# Patient Record
Sex: Male | Born: 1965
Health system: Southern US, Community
[De-identification: ages and names within clinical notes are randomized; demographics above are authoritative.]

## PROBLEM LIST (undated history)

## (undated) DIAGNOSIS — I10 Essential (primary) hypertension: Secondary | ICD-10-CM

## (undated) DIAGNOSIS — N521 Erectile dysfunction due to diseases classified elsewhere: Secondary | ICD-10-CM

## (undated) DIAGNOSIS — E1129 Type 2 diabetes mellitus with other diabetic kidney complication: Secondary | ICD-10-CM

## (undated) DIAGNOSIS — Z87891 Personal history of nicotine dependence: Secondary | ICD-10-CM

## (undated) DIAGNOSIS — M546 Pain in thoracic spine: Secondary | ICD-10-CM

## (undated) DIAGNOSIS — H5010 Unspecified exotropia: Secondary | ICD-10-CM

## (undated) DIAGNOSIS — E1169 Type 2 diabetes mellitus with other specified complication: Secondary | ICD-10-CM

## (undated) DIAGNOSIS — E785 Hyperlipidemia, unspecified: Secondary | ICD-10-CM

## (undated) DIAGNOSIS — Z7982 Long term (current) use of aspirin: Secondary | ICD-10-CM

## (undated) DIAGNOSIS — M1990 Unspecified osteoarthritis, unspecified site: Secondary | ICD-10-CM

## (undated) DIAGNOSIS — E1165 Type 2 diabetes mellitus with hyperglycemia: Principal | ICD-10-CM

## (undated) DIAGNOSIS — N529 Male erectile dysfunction, unspecified: Secondary | ICD-10-CM

## (undated) HISTORY — PX: HIP SURGERY: SHX245

## (undated) HISTORY — DX: Type 2 diabetes mellitus with hyperglycemia: E11.65

## (undated) HISTORY — DX: Pain in thoracic spine: M54.6

## (undated) HISTORY — DX: Unspecified exotropia: H50.10

## (undated) HISTORY — DX: Hyperlipidemia, unspecified: E78.5

## (undated) HISTORY — DX: Essential (primary) hypertension: I10

## (undated) HISTORY — DX: Type 2 diabetes mellitus with other specified complication: E11.69

## (undated) HISTORY — DX: Male erectile dysfunction, unspecified: N52.9

## (undated) HISTORY — PX: SHOULDER SURGERY: SHX246

## (undated) HISTORY — DX: Type 2 diabetes mellitus with other diabetic kidney complication: E11.29

## (undated) HISTORY — DX: Erectile dysfunction due to diseases classified elsewhere: N52.1

---

## 2000-08-20 ENCOUNTER — Ambulatory Visit (HOSPITAL_COMMUNITY): Admission: RE | Admit: 2000-08-20 | Discharge: 2000-08-20 | Payer: Self-pay | Admitting: Orthopedic Surgery

## 2005-11-10 ENCOUNTER — Ambulatory Visit: Payer: Self-pay | Admitting: Gastroenterology

## 2008-05-05 ENCOUNTER — Emergency Department: Payer: Self-pay | Admitting: Emergency Medicine

## 2011-09-06 LAB — HM COLONOSCOPY

## 2012-05-09 DIAGNOSIS — N529 Male erectile dysfunction, unspecified: Secondary | ICD-10-CM | POA: Insufficient documentation

## 2014-02-14 ENCOUNTER — Ambulatory Visit: Payer: Self-pay | Admitting: Family Medicine

## 2014-02-26 LAB — HEPATIC FUNCTION PANEL
ALT: 18 U/L (ref 10–40)
AST: 17 U/L (ref 14–40)
Alkaline Phosphatase: 87 U/L (ref 25–125)
Bilirubin, Total: 0.2 mg/dL

## 2014-02-26 LAB — BASIC METABOLIC PANEL
BUN: 14 mg/dL (ref 4–21)
CREATININE: 1 mg/dL (ref 0.6–1.3)
Glucose: 79 mg/dL
Potassium: 4.2 mmol/L (ref 3.4–5.3)
Sodium: 139 mmol/L (ref 137–147)

## 2014-02-26 LAB — HM DIABETES FOOT EXAM

## 2014-02-26 LAB — LIPID PANEL
Cholesterol: 149 mg/dL (ref 0–200)
HDL: 41 mg/dL (ref 35–70)
LDL Cholesterol: 60 mg/dL
TRIGLYCERIDES: 240 mg/dL — AB (ref 40–160)

## 2014-02-26 LAB — HEMOGLOBIN A1C: HEMOGLOBIN A1C: 10.1 % — AB (ref 4.0–6.0)

## 2014-03-31 ENCOUNTER — Ambulatory Visit: Payer: Self-pay | Admitting: Family Medicine

## 2014-06-11 ENCOUNTER — Other Ambulatory Visit: Payer: Self-pay

## 2014-06-11 MED ORDER — INSULIN GLARGINE 100 UNIT/ML SOLOSTAR PEN: 30.0000 [IU] | PEN_INJECTOR | Freq: Every day | SUBCUTANEOUS | Status: DC

## 2014-06-11 MED ORDER — LISINOPRIL 10 MG PO TABS: 10.0000 mg | ORAL_TABLET | Freq: Every day | ORAL | Status: DC

## 2014-06-12 ENCOUNTER — Encounter: Payer: Self-pay | Admitting: Family Medicine

## 2014-06-12 DIAGNOSIS — N521 Erectile dysfunction due to diseases classified elsewhere: Secondary | ICD-10-CM

## 2014-06-12 DIAGNOSIS — IMO0001 Reserved for inherently not codable concepts without codable children: Secondary | ICD-10-CM | POA: Insufficient documentation

## 2014-06-12 DIAGNOSIS — IMO0002 Reserved for concepts with insufficient information to code with codable children: Secondary | ICD-10-CM

## 2014-06-12 DIAGNOSIS — E1129 Type 2 diabetes mellitus with other diabetic kidney complication: Secondary | ICD-10-CM | POA: Insufficient documentation

## 2014-06-12 DIAGNOSIS — N529 Male erectile dysfunction, unspecified: Secondary | ICD-10-CM | POA: Insufficient documentation

## 2014-06-12 DIAGNOSIS — E1169 Type 2 diabetes mellitus with other specified complication: Secondary | ICD-10-CM | POA: Insufficient documentation

## 2014-06-12 DIAGNOSIS — E785 Hyperlipidemia, unspecified: Secondary | ICD-10-CM | POA: Insufficient documentation

## 2014-06-12 DIAGNOSIS — N5201 Erectile dysfunction due to arterial insufficiency: Secondary | ICD-10-CM

## 2014-06-12 DIAGNOSIS — I129 Hypertensive chronic kidney disease with stage 1 through stage 4 chronic kidney disease, or unspecified chronic kidney disease: Secondary | ICD-10-CM | POA: Insufficient documentation

## 2014-06-12 DIAGNOSIS — H5010 Unspecified exotropia: Secondary | ICD-10-CM | POA: Insufficient documentation

## 2014-06-12 DIAGNOSIS — E1165 Type 2 diabetes mellitus with hyperglycemia: Principal | ICD-10-CM

## 2014-06-12 MED ORDER — ROSUVASTATIN CALCIUM 40 MG PO TABS: 40.0000 mg | ORAL_TABLET | Freq: Every day | ORAL | Status: DC

## 2014-07-31 LAB — HM DIABETES EYE EXAM

## 2014-08-15 ENCOUNTER — Other Ambulatory Visit: Payer: Self-pay | Admitting: Family Medicine

## 2014-08-15 DIAGNOSIS — IMO0002 Reserved for concepts with insufficient information to code with codable children: Secondary | ICD-10-CM

## 2014-08-15 DIAGNOSIS — E1165 Type 2 diabetes mellitus with hyperglycemia: Principal | ICD-10-CM

## 2014-08-15 DIAGNOSIS — E1129 Type 2 diabetes mellitus with other diabetic kidney complication: Secondary | ICD-10-CM

## 2014-08-15 NOTE — Telephone Encounter (Signed)
Routing to provider  

## 2014-08-16 MED ORDER — METFORMIN HCL 1000 MG PO TABS: 1000.0000 mg | ORAL_TABLET | Freq: Two times a day (BID) | ORAL | Status: DC

## 2014-08-16 MED ORDER — GLUCOSE BLOOD VI STRP
1.0000 | ORAL_STRIP | Status: DC | PRN
Start: 2014-08-16 — End: 2016-08-10

## 2014-08-16 NOTE — Telephone Encounter (Signed)
Please let [name] know that I'd like to see patient for an appointment here in the office for:  diabetes Please schedule a visit with me  in the next: 2 weeks Fasting?  yes Thank you, Dr. Sanda Klein ---------------- Please FAX prescriptions to LOCAL pharmacy; I will not do 3 month mail order since he is overdue for visit and labs; need to monitor before we send off 3 month supply as doses may change or meds may be stopped completely and new ones started Dr. Sanda Klein

## 2014-08-16 NOTE — Telephone Encounter (Signed)
I left a vm on pt phone to give [place] a call to schedule a f/u+fasting labs in the next 2 weeks.

## 2014-08-31 DIAGNOSIS — N529 Male erectile dysfunction, unspecified: Secondary | ICD-10-CM | POA: Insufficient documentation

## 2014-08-31 DIAGNOSIS — E1129 Type 2 diabetes mellitus with other diabetic kidney complication: Secondary | ICD-10-CM | POA: Insufficient documentation

## 2014-08-31 DIAGNOSIS — H5010 Unspecified exotropia: Secondary | ICD-10-CM | POA: Insufficient documentation

## 2014-08-31 DIAGNOSIS — I129 Hypertensive chronic kidney disease with stage 1 through stage 4 chronic kidney disease, or unspecified chronic kidney disease: Secondary | ICD-10-CM | POA: Insufficient documentation

## 2014-08-31 DIAGNOSIS — M546 Pain in thoracic spine: Secondary | ICD-10-CM | POA: Insufficient documentation

## 2014-09-05 ENCOUNTER — Encounter: Payer: Self-pay | Admitting: Family Medicine

## 2014-09-05 ENCOUNTER — Ambulatory Visit (INDEPENDENT_AMBULATORY_CARE_PROVIDER_SITE_OTHER): Payer: BLUE CROSS/BLUE SHIELD | Admitting: Family Medicine

## 2014-09-05 VITALS — BP 141/93 | HR 72 | Temp 97.7°F | Ht 66.25 in | Wt 171.0 lb

## 2014-09-05 DIAGNOSIS — E1129 Type 2 diabetes mellitus with other diabetic kidney complication: Secondary | ICD-10-CM

## 2014-09-05 DIAGNOSIS — IMO0002 Reserved for concepts with insufficient information to code with codable children: Secondary | ICD-10-CM

## 2014-09-05 DIAGNOSIS — Z125 Encounter for screening for malignant neoplasm of prostate: Secondary | ICD-10-CM

## 2014-09-05 DIAGNOSIS — N400 Enlarged prostate without lower urinary tract symptoms: Secondary | ICD-10-CM | POA: Insufficient documentation

## 2014-09-05 DIAGNOSIS — E785 Hyperlipidemia, unspecified: Secondary | ICD-10-CM

## 2014-09-05 DIAGNOSIS — Z0000 Encounter for general adult medical examination without abnormal findings: Secondary | ICD-10-CM

## 2014-09-05 DIAGNOSIS — E1165 Type 2 diabetes mellitus with hyperglycemia: Secondary | ICD-10-CM

## 2014-09-05 MED ORDER — ROSUVASTATIN CALCIUM 40 MG PO TABS: 40.0000 mg | ORAL_TABLET | Freq: Every day | ORAL | Status: DC

## 2014-09-05 MED ORDER — METFORMIN HCL 1000 MG PO TABS: 1000.0000 mg | ORAL_TABLET | Freq: Two times a day (BID) | ORAL | Status: DC

## 2014-09-05 MED ORDER — ASPIRIN EC 81 MG PO TBEC: 81.0000 mg | DELAYED_RELEASE_TABLET | Freq: Every day | ORAL | Status: DC

## 2014-09-05 NOTE — Patient Instructions (Addendum)
[facility] staff -- please request colonoscopy report from GI doctor (done in 2013) Start back on coated 81 mg aspirin once a day to help prevent heart attack You do not need another pneumonia vaccine until age 51 Please do get a flu shot every fall, okay to wait until September or October Return in one year for your next physical Return in two weeks for your diabetes and cholesterol; you do not need to come fasting for that since we collected your bloodwork today Please bring your glucose readings with you if you can Your blood pressure is above goal today; try to follow the DASH guidelines and we'll see if it's improved at your appointment in a couple of weeks Try to lose 10 pounds over the next several months, slowly and safely and gradually by reducing portions a little, avoiding sweets and unhealthy snacks, and staying away from empty calories   Health Maintenance A healthy lifestyle and preventative care can promote health and wellness.  Maintain regular health, dental, and eye exams.  Eat a healthy diet. Foods like vegetables, fruits, whole grains, low-fat dairy products, and lean protein foods contain the nutrients you need and are low in calories. Decrease your intake of foods high in solid fats, added sugars, and salt. Get information about a proper diet from your health care provider, if necessary.  Regular physical exercise is one of the most important things you can do for your health. Most adults should get at least 150 minutes of moderate-intensity exercise (any activity that increases your heart rate and causes you to sweat) each week. In addition, most adults need muscle-strengthening exercises on 2 or more days a week.   Maintain a healthy weight. The body mass index (BMI) is a screening tool to identify possible weight problems. It provides an estimate of body fat based on height and weight. Your health care provider can find your BMI and can help you achieve or maintain a  healthy weight. For males 20 years and older:  A BMI below 18.5 is considered underweight.  A BMI of 18.5 to 24.9 is normal.  A BMI of 25 to 29.9 is considered overweight.  A BMI of 30 and above is considered obese.  Maintain normal blood lipids and cholesterol by exercising and minimizing your intake of saturated fat. Eat a balanced diet with plenty of fruits and vegetables. Blood tests for lipids and cholesterol should begin at age 34 and be repeated every 5 years. If your lipid or cholesterol levels are high, you are over age 84, or you are at high risk for heart disease, you may need your cholesterol levels checked more frequently.Ongoing high lipid and cholesterol levels should be treated with medicines if diet and exercise are not working.  If you smoke, find out from your health care provider how to quit. If you do not use tobacco, do not start.  Lung cancer screening is recommended for adults aged 42-80 years who are at high risk for developing lung cancer because of a history of smoking. A yearly low-dose CT scan of the lungs is recommended for people who have at least a 30-pack-year history of smoking and are current smokers or have quit within the past 15 years. A pack year of smoking is smoking an average of 1 pack of cigarettes a day for 1 year (for example, a 30-pack-year history of smoking could mean smoking 1 pack a day for 30 years or 2 packs a day for 15 years). Yearly screening should  continue until the smoker has stopped smoking for at least 15 years. Yearly screening should be stopped for people who develop a health problem that would prevent them from having lung cancer treatment.  If you choose to drink alcohol, do not have more than 2 drinks per day. One drink is considered to be 12 oz (360 mL) of beer, 5 oz (150 mL) of wine, or 1.5 oz (45 mL) of liquor.  Avoid the use of street drugs. Do not share needles with anyone. Ask for help if you need support or instructions about  stopping the use of drugs.  High blood pressure causes heart disease and increases the risk of stroke. Blood pressure should be checked at least every 1-2 years. Ongoing high blood pressure should be treated with medicines if weight loss and exercise are not effective.  If you are 5-53 years old, ask your health care provider if you should take aspirin to prevent heart disease.  Diabetes screening involves taking a blood sample to check your fasting blood sugar level. This should be done once every 3 years after age 54 if you are at a normal weight and without risk factors for diabetes. Testing should be considered at a younger age or be carried out more frequently if you are overweight and have at least 1 risk factor for diabetes.  Colorectal cancer can be detected and often prevented. Most routine colorectal cancer screening begins at the age of 47 and continues through age 43. However, your health care provider may recommend screening at an earlier age if you have risk factors for colon cancer. On a yearly basis, your health care provider may provide home test kits to check for hidden blood in the stool. A small camera at the end of a tube may be used to directly examine the colon (sigmoidoscopy or colonoscopy) to detect the earliest forms of colorectal cancer. Talk to your health care provider about this at age 80 when routine screening begins. A direct exam of the colon should be repeated every 5-10 years through age 44, unless early forms of precancerous polyps or small growths are found.  People who are at an increased risk for hepatitis B should be screened for this virus. You are considered at high risk for hepatitis B if:  You were born in a country where hepatitis B occurs often. Talk with your health care provider about which countries are considered high risk.  Your parents were born in a high-risk country and you have not received a shot to protect against hepatitis B (hepatitis B  vaccine).  You have HIV or AIDS.  You use needles to inject street drugs.  You live with, or have sex with, someone who has hepatitis B.  You are a man who has sex with other men (MSM).  You get hemodialysis treatment.  You take certain medicines for conditions like cancer, organ transplantation, and autoimmune conditions.  Hepatitis C blood testing is recommended for all people born from [birth year] through [birth year] and any individual with known risk factors for hepatitis C.  Healthy men should no longer receive prostate-specific antigen (PSA) blood tests as part of routine cancer screening. Talk to your health care provider about prostate cancer screening.  Testicular cancer screening is not recommended for adolescents or adult males who have no symptoms. Screening includes self-exam, a health care provider exam, and other screening tests. Consult with your health care provider about any symptoms you have or any concerns you have about testicular cancer.  Practice safe sex. Use condoms and avoid high-risk sexual practices to reduce the spread of sexually transmitted infections (STIs).  You should be screened for STIs, including gonorrhea and chlamydia if:  You are sexually active and are younger than 24 years.  You are older than 24 years, and your health care provider tells you that you are at risk for this type of infection.  Your sexual activity has changed since you were last screened, and you are at an increased risk for chlamydia or gonorrhea. Ask your health care provider if you are at risk.  If you are at risk of being infected with HIV, it is recommended that you take a prescription medicine daily to prevent HIV infection. This is called pre-exposure prophylaxis (PrEP). You are considered at risk if:  You are a man who has sex with other men (MSM).  You are a heterosexual man who is sexually active with multiple partners.  You take drugs by injection.  You are sexually active  with a partner who has HIV.  Talk with your health care provider about whether you are at high risk of being infected with HIV. If you choose to begin PrEP, you should first be tested for HIV. You should then be tested every 3 months for as long as you are taking PrEP.  Use sunscreen. Apply sunscreen liberally and repeatedly throughout the day. You should seek shade when your shadow is shorter than you. Protect yourself by wearing long sleeves, pants, a wide-brimmed hat, and sunglasses year round whenever you are outdoors.  Tell your health care provider of new moles or changes in moles, especially if there is a change in shape or color. Also, tell your health care provider if a mole is larger than the size of a pencil eraser.  A one-time screening for abdominal aortic aneurysm (AAA) and surgical repair of large AAAs by ultrasound is recommended for men aged 51-75 years who are current or former smokers.  Stay current with your vaccines (immunizations). Document Released: 06/20/2007 Document Revised: 12/27/2012 Document Reviewed: 05/19/2010 [facility] Patient Information 2015 [place], [place]. This information is not intended to replace advice given to you by your health care provider. Make sure you discuss any questions you have with your health care provider. DASH Eating Plan DASH stands for "Dietary Approaches to Stop Hypertension." The DASH eating plan is a healthy eating plan that has been shown to reduce high blood pressure (hypertension). Additional health benefits may include reducing the risk of type 2 diabetes mellitus, heart disease, and stroke. The DASH eating plan may also help with weight loss. WHAT DO I NEED TO KNOW ABOUT THE DASH EATING PLAN? For the DASH eating plan, you will follow these general guidelines:  Choose foods with a percent daily value for sodium of less than 5% (as listed on the food label).  Use salt-free seasonings or herbs instead of table salt or sea salt.  Check  with your health care provider or pharmacist before using salt substitutes.  Eat lower-sodium products, often labeled as "lower sodium" or "no salt added."  Eat fresh foods.  Eat more vegetables, fruits, and low-fat dairy products.  Choose whole grains. Look for the word "whole" as the first word in the ingredient list.  Choose fish and skinless chicken or [place] more often than red meat. Limit fish, poultry, and meat to 6 oz (170 g) each day.  Limit sweets, desserts, sugars, and sugary drinks.  Choose heart-healthy fats.  Limit cheese to 1 oz (  28 g) per day.  Eat more home-cooked food and less restaurant, buffet, and fast food.  Limit fried foods.  Cook foods using methods other than frying.  Limit canned vegetables. If you do use them, rinse them well to decrease the sodium.  When eating at a restaurant, ask that your food be prepared with less salt, or no salt if possible. WHAT FOODS CAN I EAT? Seek help from a dietitian for individual calorie needs. Grains Whole grain or whole wheat bread. Brown rice. Whole grain or whole wheat pasta. Quinoa, bulgur, and whole grain cereals. Low-sodium cereals. Corn or whole wheat flour tortillas. Whole grain cornbread. Whole grain crackers. Low-sodium crackers. Vegetables Fresh or frozen vegetables (raw, steamed, roasted, or grilled). Low-sodium or reduced-sodium tomato and vegetable juices. Low-sodium or reduced-sodium tomato sauce and paste. Low-sodium or reduced-sodium canned vegetables.  Fruits All fresh, canned (in natural juice), or frozen fruits. Meat and Other Protein Products Ground beef (85% or leaner), grass-fed beef, or beef trimmed of fat. Skinless chicken or [place]. Ground chicken or [place]. Pork trimmed of fat. All fish and seafood. Eggs. Dried beans, peas, or lentils. Unsalted nuts and seeds. Unsalted canned beans. Dairy Low-fat dairy products, such as skim or 1% milk, 2% or reduced-fat cheeses, low-fat ricotta or cottage  cheese, or plain low-fat yogurt. Low-sodium or reduced-sodium cheeses. Fats and Oils Tub margarines without trans fats. Light or reduced-fat mayonnaise and salad dressings (reduced sodium). Avocado. Safflower, olive, or canola oils. Natural peanut or almond butter. Other Unsalted popcorn and pretzels. The items listed above may not be a complete list of recommended foods or beverages. Contact your dietitian for more options. WHAT FOODS ARE NOT RECOMMENDED? Grains White bread. White pasta. White rice. Refined cornbread. Bagels and croissants. Crackers that contain trans fat. Vegetables Creamed or fried vegetables. Vegetables in a cheese sauce. Regular canned vegetables. Regular canned tomato sauce and paste. Regular tomato and vegetable juices. Fruits Dried fruits. Canned fruit in light or heavy syrup. Fruit juice. Meat and Other Protein Products Fatty cuts of meat. Ribs, chicken wings, bacon, sausage, bologna, salami, chitterlings, fatback, hot dogs, bratwurst, and packaged luncheon meats. Salted nuts and seeds. Canned beans with salt. Dairy Whole or 2% milk, cream, half-and-half, and cream cheese. Whole-fat or sweetened yogurt. Full-fat cheeses or blue cheese. Nondairy creamers and whipped toppings. Processed cheese, cheese spreads, or cheese curds. Condiments Onion and garlic salt, seasoned salt, table salt, and sea salt. Canned and packaged gravies. Worcestershire sauce. Tartar sauce. Barbecue sauce. Teriyaki sauce. Soy sauce, including reduced sodium. Steak sauce. Fish sauce. Oyster sauce. Cocktail sauce. Horseradish. Ketchup and mustard. Meat flavorings and tenderizers. Bouillon cubes. Hot sauce. Tabasco sauce. Marinades. Taco seasonings. Relishes. Fats and Oils Butter, stick margarine, lard, shortening, ghee, and bacon fat. Coconut, palm kernel, or palm oils. Regular salad dressings. Other Pickles and olives. Salted popcorn and pretzels. The items listed above may not be a complete list  of foods and beverages to avoid. Contact your dietitian for more information. WHERE CAN I FIND MORE INFORMATION? National Heart, Lung, and Blood Institute: travelstabloid.com Document Released: 12/11/2010 Document Revised: 05/08/2013 Document Reviewed: 10/26/2012 [facility] Patient Information 2015 [place], [place]. This information is not intended to replace advice given to you by your health care provider. Make sure you discuss any questions you have with your health care provider.

## 2014-09-05 NOTE — Assessment & Plan Note (Signed)
Check lipids will discuss later

## 2014-09-05 NOTE — Addendum Note (Signed)
Addended by: Hanny Elsberry, Satira Anis on: 09/05/2014 03:07 PM   Modules accepted: Orders

## 2014-09-05 NOTE — Progress Notes (Addendum)
Patient ID: Arthur [name], male   DOB: Mar 06, 1965, 49 y.o.   MRN: [id number]   Subjective:   Arthur [name] is a 49 y.o. male here for a complete physical exam  Interim issues since last visit: he had back problems; had a massage and the therapist said that they said there was tenseness and then asked about him changing shoes, leaning to one side all the time; the heels on the right side were all worn out; he went a few times, therapist removed the stress and all better  Obesity: not a problem; overweight, goal 160 pounds Alcohol: not a problem Tobacco: quit Depression screen [facility] 2/9 09/05/2014  Decreased Interest 0  Down, Depressed, Hopeless 0  PHQ - 2 Score 0  HIV, Hep C, Hep B testing: low risk, politely declined Cholesterol: today Glucose and A1C: today Colorectal cancer: UTD, has had two colonoscopies, one at age 6 when he had some bleeding; then every five years at that point; then had one at age 82; had it done in 2013 Breast cancer: low risk Lung cancer: quit, not old enough Osteoporosis: low risk AAA: not old enough Aspirin: he fits criteria, no longer taking the aspirin any more; he just stopped Exercise: walks a lot on his job; no formal exercise; 15k steps a day, wears a podometer for a study with work Diet: tries to watch a good diet, there is room for improvement  He did have some flow issues a while back and saw urologist; no issues now; he had an enlarged prostate; has had that done several times;   Past Medical History  Diagnosis Date  . Type II diabetes mellitus with renal manifestations, uncontrolled 06/12/2014  . Erectile dysfunction associated with type 2 diabetes mellitus   . Diabetes mellitus with renal complications   . Hypertension   . Hyperlipidemia   . ED (erectile dysfunction)   . Exotropia   . Thoracic back pain    Past Surgical History  Procedure Laterality Date  . Shoulder surgery Right     bone spurs removed   Family History  Problem  Relation Age of Onset  . Diabetes Father   . Diabetes Sister   . Lung disease Mother   . Diabetes Maternal Aunt    Social History  Substance Use Topics  . Smoking status: Former Smoker -- 1.00 packs/day for 20 years    Types: Cigarettes    Quit date: 01/05/1986  . Smokeless tobacco: Never Used  . Alcohol Use: No   Review of Systems  Objective:   Filed Vitals:   09/05/14 1409  BP: 141/93  Pulse: 72  Temp: 97.7 F (36.5 C)  Height: 5' 6.25" (1.683 m)  Weight: 171 lb (77.565 kg)  SpO2: 100%   Body mass index is 27.38 kg/(m^2). Wt Readings from Last 3 Encounters:  09/05/14 171 lb (77.565 kg)  03/26/14 173 lb (78.472 kg)   Physical Exam  Constitutional: He appears well-developed and well-nourished. No distress.  HENT:  Head: Normocephalic and atraumatic.  Nose: Nose normal.  Mouth/Throat: Oropharynx is clear and moist.  Eyes: EOM are normal. No scleral icterus.  Neck: No JVD present. No thyromegaly present.  Cardiovascular: Normal rate, regular rhythm and normal heart sounds.   Pulmonary/Chest: Effort normal and breath sounds normal. No respiratory distress. He has no wheezes. He has no rales.  Abdominal: Soft. Bowel sounds are normal. He exhibits no distension. There is no tenderness. There is no guarding.  Genitourinary: Prostate normal. Rectal exam  shows no external hemorrhoid, no fissure, no mass, no tenderness and anal tone normal. Prostate is not enlarged (1+, symmetric) and not tender.  Musculoskeletal: Normal range of motion. He exhibits no edema.  Lymphadenopathy:    He has no cervical adenopathy.  Neurological: He is alert. He displays normal reflexes. He exhibits normal muscle tone. Coordination normal.  Skin: Skin is warm and dry. No rash noted. He is not diaphoretic. No erythema. No pallor.  Psychiatric: He has a normal mood and affect. His behavior is normal. Judgment and thought content normal.    Assessment/Plan:   Problem List Items Addressed This  Visit      Endocrine   Type II diabetes mellitus with renal manifestations, uncontrolled    Check A1C and urine microalbumin today; will have him return for discussion      Relevant Medications   aspirin EC 81 MG tablet   Other Relevant Orders   Hgb A1c w/o eAG   Microalbumin / creatinine urine ratio     Genitourinary   Benign prostatic hypertrophy   Relevant Orders   PSA     Other   Hyperlipidemia    Check lipids will discuss later      Relevant Medications   aspirin EC 81 MG tablet   Other Relevant Orders   Lipid Panel w/o Chol/HDL Ratio   Preventative health care - Primary   Relevant Orders   Comprehensive metabolic panel   Prostate cancer screening   Relevant Orders   PSA      Meds ordered this encounter  Medications  . aspirin EC 81 MG tablet    Sig: Take 1 tablet (81 mg total) by mouth daily.   Orders Placed This Encounter  Procedures  . Hgb A1c w/o eAG  . Lipid Panel w/o Chol/HDL Ratio    Order Specific Question:  Has the patient fasted?    Answer:  Yes  . Comprehensive metabolic panel    Order Specific Question:  Has the patient fasted?    Answer:  Yes  . Microalbumin / creatinine urine ratio  . PSA    Follow up plan: Return in about 1 year (around 09/05/2015) for for next physical; 2 weeks for diabetes and cholesterol.  An After Visit Summary was printed and given to the patient.

## 2014-09-05 NOTE — Assessment & Plan Note (Signed)
Check A1C and urine microalbumin today; will have him return for discussion

## 2014-09-06 ENCOUNTER — Other Ambulatory Visit: Payer: Self-pay | Admitting: Family Medicine

## 2014-09-06 ENCOUNTER — Encounter: Payer: Self-pay | Admitting: Family Medicine

## 2014-09-06 DIAGNOSIS — E785 Hyperlipidemia, unspecified: Secondary | ICD-10-CM

## 2014-09-06 DIAGNOSIS — IMO0001 Reserved for inherently not codable concepts without codable children: Secondary | ICD-10-CM

## 2014-09-06 LAB — HGB A1C W/O EAG: Hgb A1c MFr Bld: 11.1 % — ABNORMAL HIGH (ref 4.8–5.6)

## 2014-09-06 LAB — COMPREHENSIVE METABOLIC PANEL
ALT: 23 IU/L (ref 0–44)
AST: 20 IU/L (ref 0–40)
Albumin/Globulin Ratio: 1.8 (ref 1.1–2.5)
Albumin: 4.5 g/dL (ref 3.5–5.5)
Alkaline Phosphatase: 75 IU/L (ref 39–117)
BUN/Creatinine Ratio: 8 — ABNORMAL LOW (ref 9–20)
BUN: 9 mg/dL (ref 6–24)
Bilirubin Total: 0.3 mg/dL (ref 0.0–1.2)
CALCIUM: 10 mg/dL (ref 8.7–10.2)
CO2: 24 mmol/L (ref 18–29)
CREATININE: 1.12 mg/dL (ref 0.76–1.27)
Chloride: 103 mmol/L (ref 97–108)
GFR, EST AFRICAN AMERICAN: 87 mL/min/{1.73_m2} (ref >59)
GFR, EST NON AFRICAN AMERICAN: 76 mL/min/{1.73_m2} (ref >59)
GLOBULIN, TOTAL: 2.5 g/dL (ref 1.5–4.5)
Glucose: 74 mg/dL (ref 65–99)
Potassium: 3.9 mmol/L (ref 3.5–5.2)
SODIUM: 143 mmol/L (ref 134–144)
TOTAL PROTEIN: 7 g/dL (ref 6.0–8.5)

## 2014-09-06 LAB — LIPID PANEL W/O CHOL/HDL RATIO
Cholesterol, Total: 337 mg/dL — ABNORMAL HIGH (ref 100–199)
HDL: 47 mg/dL (ref >39)
LDL Calculated: 260 mg/dL — ABNORMAL HIGH (ref 0–99)
Triglycerides: 151 mg/dL — ABNORMAL HIGH (ref 0–149)
VLDL Cholesterol Cal: 30 mg/dL (ref 5–40)

## 2014-09-06 LAB — MICROALBUMIN / CREATININE URINE RATIO
CREATININE, UR: 105.5 mg/dL
Creatinine, Urine: 120.8 mg/dL
MICROALB/CREAT RATIO: 52.9 mg/g creat — ABNORMAL HIGH (ref 0.0–30.0)
MICROALB/CREAT RATIO: 48.5 mg/g{creat} — AB (ref 0.0–30.0)
MICROALBUM., U, RANDOM: 55.8 ug/mL
MICROALBUM., U, RANDOM: 58.6 ug/mL

## 2014-09-06 LAB — PSA: Prostate Specific Ag, Serum: 0.8 ng/mL (ref 0.0–4.0)

## 2014-09-06 NOTE — Assessment & Plan Note (Signed)
LDL 260; refer to endo

## 2014-09-06 NOTE — Assessment & Plan Note (Signed)
Refer to endo 

## 2014-09-19 ENCOUNTER — Ambulatory Visit: Payer: BLUE CROSS/BLUE SHIELD | Admitting: Family Medicine

## 2014-09-25 ENCOUNTER — Telehealth: Payer: Self-pay

## 2014-09-25 DIAGNOSIS — E1129 Type 2 diabetes mellitus with other diabetic kidney complication: Secondary | ICD-10-CM

## 2014-09-25 DIAGNOSIS — IMO0002 Reserved for concepts with insufficient information to code with codable children: Secondary | ICD-10-CM

## 2014-09-25 DIAGNOSIS — E1165 Type 2 diabetes mellitus with hyperglycemia: Principal | ICD-10-CM

## 2014-09-25 MED ORDER — INSULIN GLARGINE 100 UNIT/ML SOLOSTAR PEN: 35.0000 [IU] | PEN_INJECTOR | Freq: Every day | SUBCUTANEOUS | Status: DC

## 2014-09-25 NOTE — Telephone Encounter (Signed)
Left message to call.

## 2014-09-25 NOTE — Assessment & Plan Note (Signed)
Uncontrolled type 2 diabetes mellitus; may be LADA or type 1.5;

## 2014-09-25 NOTE — Telephone Encounter (Signed)
Yes, new referral entered In the meantime, have patient increase his once a day insulin from thirty units a day to thirty-five units a day; if still high in one week (over 200 with no lows), then increase to thirty-eight units once a day

## 2014-09-25 NOTE — Telephone Encounter (Signed)
Patient also called in regards to him missing his appointment, he would like to be rescheduled with someone else since they can no longer see him. Can you put in a new referral? Thanks!

## 2014-09-25 NOTE — Telephone Encounter (Signed)
Barbara with [facility] called and stated that patient called this morning and cancelled his appt. With them for today. He said he had a meeting. They informed him that their policy is if they do not cancel with a 24 hour notice for new patients, they will NOT be rescheduled.

## 2014-09-26 NOTE — Telephone Encounter (Signed)
Patient notified

## 2014-12-04 ENCOUNTER — Telehealth: Payer: Self-pay | Admitting: Family Medicine

## 2014-12-04 NOTE — Telephone Encounter (Signed)
Patient called wanting a sample or a Rx of his Insulin Glargine (LANTUS) 100 UNIT/ML Solostar Pen because he is out and won't get his mail order until Friday. Please call patient if he can come in and get a sample to last him until then.

## 2014-12-05 NOTE — Telephone Encounter (Signed)
That's fine to give him one sample Lantus if we have it, OR you can call in one refill to his local pharmacy, same sig If not, he can check with his endocrinologist (I referred him to Dr. Gabriel Carina more than two months ago)

## 2014-12-05 NOTE — Telephone Encounter (Signed)
Left message to call.

## 2014-12-05 NOTE — Telephone Encounter (Signed)
Routing to provider  

## 2014-12-06 NOTE — Telephone Encounter (Signed)
I spoke with patient, he states he got it and does not need a sample or rx called in.

## 2014-12-07 ENCOUNTER — Ambulatory Visit (INDEPENDENT_AMBULATORY_CARE_PROVIDER_SITE_OTHER): Payer: BLUE CROSS/BLUE SHIELD | Admitting: Family Medicine

## 2014-12-07 ENCOUNTER — Encounter: Payer: Self-pay | Admitting: Family Medicine

## 2014-12-07 VITALS — BP 126/86 | HR 72 | Temp 97.6°F | Wt 169.0 lb

## 2014-12-07 DIAGNOSIS — E1121 Type 2 diabetes mellitus with diabetic nephropathy: Secondary | ICD-10-CM

## 2014-12-07 DIAGNOSIS — G8929 Other chronic pain: Secondary | ICD-10-CM | POA: Diagnosis not present

## 2014-12-07 DIAGNOSIS — I10 Essential (primary) hypertension: Secondary | ICD-10-CM | POA: Diagnosis not present

## 2014-12-07 DIAGNOSIS — IMO0002 Reserved for concepts with insufficient information to code with codable children: Secondary | ICD-10-CM

## 2014-12-07 DIAGNOSIS — Z5181 Encounter for therapeutic drug level monitoring: Secondary | ICD-10-CM

## 2014-12-07 DIAGNOSIS — Z794 Long term (current) use of insulin: Secondary | ICD-10-CM

## 2014-12-07 DIAGNOSIS — M25562 Pain in left knee: Secondary | ICD-10-CM | POA: Diagnosis not present

## 2014-12-07 DIAGNOSIS — E1165 Type 2 diabetes mellitus with hyperglycemia: Secondary | ICD-10-CM | POA: Diagnosis not present

## 2014-12-07 DIAGNOSIS — E785 Hyperlipidemia, unspecified: Secondary | ICD-10-CM | POA: Diagnosis not present

## 2014-12-07 MED ORDER — FLUTICASONE PROPIONATE 50 MCG/ACT NA SUSP: 2.0000 | Freq: Every day | NASAL | Status: DC

## 2014-12-07 NOTE — Patient Instructions (Addendum)
Try to use PLAIN allergy medicine without the decongestant Avoid: phenylephrine, phenylpropanolamine, and pseudoephredine Use the new nasal spray and hopefully that will keep you from having to use any decongestants We'll let you know what the labs show We'll refer you to an orthopaedist If you have not heard anything from my staff in a week about any orders/referrals/studies from today, please contact us here to follow-up [phone] Keep up the good work

## 2014-12-07 NOTE — Assessment & Plan Note (Addendum)
Check A1c; fasting glucose checked today; eye exam UTD; foot exam today by MD; we are both hopeful that his A1c will have dropped over the last 3 months, encouragement given

## 2014-12-07 NOTE — Assessment & Plan Note (Signed)
Fasting lipids today; limit saturated fats

## 2014-12-07 NOTE — Progress Notes (Signed)
BP 126/86 mmHg  Pulse 72  Temp(Src) 97.6 F (36.4 C)  Wt 169 lb (76.658 kg)  SpO2 100%  Subjective:    Patient ID: Arthur [name], male    DOB: Jul 17, 1965, 49 y.o.   MRN: [id number]  HPI: Arthur [name] is a 49 y.o. male  Chief Complaint  Patient presents with  . Diabetes  . Hypertension   Patient has type 2 diabetes, treated with insulin He forgot to bring his meter in, but sugars have been really good; 3 months since last labs and he is fasting and really hoping for better numbers Sugars have been 100-150 range He is eating better and taking his medicine all the time He is expecting his A1c to be better Reviewed last A1c from September 05, 2014 A1c 11.1  High blood pressure noted in the chart (above under chief complaint), but patient says he doesn't really have high blood pressure; we discussed; he is really on ACE-I to protect his kidneys; he tries to avoid salt; does use decongestants every once in a while (I advised him not to do this and explained why)  He had really elevated cholesterol at last check and is now taking a statin; no problems on that Reviewed labs from September 05, 2014: Total cholesterol 337 TG 151 HDL 47 LDL 260  Knee pain (left); saw orthopaedist; no tylenol, no ice; has brace at home  Relevant past medical, surgical, family and social history reviewed and updated as indicated. Interim medical history since our last visit reviewed. Allergies and medications reviewed and updated.  Review of Systems  Respiratory: Negative for shortness of breath.   Cardiovascular: Negative for chest pain.  Musculoskeletal: Positive for arthralgias (knee pain; saw orthopaedist).   Per HPI unless specifically indicated above     Objective:    BP 126/86 mmHg  Pulse 72  Temp(Src) 97.6 F (36.4 C)  Wt 169 lb (76.658 kg)  SpO2 100%  Wt Readings from Last 3 Encounters:  12/07/14 169 lb (76.658 kg)  09/05/14 171 lb (77.565 kg)  03/26/14 173 lb (78.472 kg)     Physical Exam  Constitutional: He appears well-developed and well-nourished. No distress.  HENT:  Head: Normocephalic and atraumatic.  Eyes: EOM are normal. No scleral icterus.  Neck: No thyromegaly present.  Cardiovascular: Normal rate and regular rhythm.   No murmur heard. Pulmonary/Chest: Effort normal and breath sounds normal.  Abdominal: Soft. Bowel sounds are normal. He exhibits no distension.  Musculoskeletal: He exhibits no edema.  Neurological: He is alert. He displays no tremor. No sensory deficit (of the feet). Coordination normal.  Skin: Skin is warm and dry. No pallor.  Psychiatric: He has a normal mood and affect. His behavior is normal. Judgment and thought content normal.   Diabetic Foot Form - Detailed   Diabetic Foot Exam - detailed  Diabetic Foot exam was performed with the following findings:  Yes 12/07/2014  4:36 PM  Visual Foot Exam completed.:  Yes  Are the toenails long?:  No  Are the toenails thick?:  No  Are the toenails ingrown?:  No    Pulse Foot Exam completed.:  Yes  Right Dorsalis Pedis:  Present Left Dorsalis Pedis:  Present  Sensory Foot Exam Completed.:  Yes  Swelling:  No  Semmes-Weinstein Monofilament Test  R Site 1-Great Toe:  Pos L Site 1-Great Toe:  Pos  R Site 4:  Pos L Site 4:  Pos  R Site 5:  Pos L Site 5:  Pos           Assessment & Plan:   Problem List Items Addressed This Visit      Cardiovascular and Mediastinum   Hypertension    BP controlled today on the ACE-I, which is used for renal protection        Endocrine   Type II diabetes mellitus with renal manifestations, uncontrolled (HCC) - Primary    Check A1c; fasting glucose checked today; eye exam UTD; foot exam today by MD; we are both hopeful that his A1c will have dropped over the last 3 months, encouragement given      Relevant Orders   Basic metabolic panel (Completed)   Hgb A1c w/o eAG     Other   Hyperlipidemia    Fasting lipids today; limit saturated  fats      Relevant Orders   Lipid Panel w/o Chol/HDL Ratio (Completed)   Chronic pain of left knee    Will refer to orthopaedist for evaluation and treatment; would like to AVOID NSAIDs if at all possible for this patient      Relevant Orders   Ambulatory referral to Orthopedic Surgery   Medication monitoring encounter   Relevant Orders   ALT (Completed)      Follow up plan: Return in about 3 months (around 03/07/2015) for diabetes.  An after-visit summary was printed and given to the patient at [facility].  Please see the patient instructions which may contain other information and recommendations beyond what is mentioned above in the assessment and plan.  Orders Placed This Encounter  Procedures  . Lipid Panel w/o Chol/HDL Ratio  . Basic metabolic panel  . ALT  . Hgb A1c w/o eAG  . Specimen status report  . Ambulatory referral to Orthopedic Surgery  . [facility] DIABETES EYE EXAM

## 2014-12-11 NOTE — Assessment & Plan Note (Signed)
BP controlled today on the ACE-I, which is used for renal protection

## 2014-12-11 NOTE — Assessment & Plan Note (Addendum)
Will refer to orthopaedist for evaluation and treatment; would like to AVOID NSAIDs if at all possible for this patient

## 2014-12-12 LAB — SPECIMEN STATUS REPORT

## 2014-12-12 LAB — HGB A1C W/O EAG: Hgb A1c MFr Bld: 8.9 % — ABNORMAL HIGH (ref 4.8–5.6)

## 2014-12-17 ENCOUNTER — Encounter (HOSPITAL_COMMUNITY): Payer: Self-pay | Admitting: Emergency Medicine

## 2014-12-17 ENCOUNTER — Emergency Department (HOSPITAL_COMMUNITY): Payer: Worker's Compensation

## 2014-12-17 ENCOUNTER — Emergency Department (HOSPITAL_COMMUNITY)
Admission: EM | Admit: 2014-12-17 | Discharge: 2014-12-17 | Disposition: A | Discharge status: Home / Self Care | Payer: Worker's Compensation | Attending: Emergency Medicine | Admitting: Emergency Medicine

## 2014-12-17 DIAGNOSIS — Z7984 Long term (current) use of oral hypoglycemic drugs: Secondary | ICD-10-CM | POA: Diagnosis not present

## 2014-12-17 DIAGNOSIS — E1129 Type 2 diabetes mellitus with other diabetic kidney complication: Secondary | ICD-10-CM | POA: Diagnosis not present

## 2014-12-17 DIAGNOSIS — N521 Erectile dysfunction due to diseases classified elsewhere: Secondary | ICD-10-CM | POA: Insufficient documentation

## 2014-12-17 DIAGNOSIS — Z8739 Personal history of other diseases of the musculoskeletal system and connective tissue: Secondary | ICD-10-CM | POA: Insufficient documentation

## 2014-12-17 DIAGNOSIS — Y9289 Other specified places as the place of occurrence of the external cause: Secondary | ICD-10-CM | POA: Insufficient documentation

## 2014-12-17 DIAGNOSIS — Z794 Long term (current) use of insulin: Secondary | ICD-10-CM | POA: Diagnosis not present

## 2014-12-17 DIAGNOSIS — E1169 Type 2 diabetes mellitus with other specified complication: Secondary | ICD-10-CM | POA: Insufficient documentation

## 2014-12-17 DIAGNOSIS — W108XXA Fall (on) (from) other stairs and steps, initial encounter: Secondary | ICD-10-CM | POA: Diagnosis not present

## 2014-12-17 DIAGNOSIS — M79604 Pain in right leg: Secondary | ICD-10-CM

## 2014-12-17 DIAGNOSIS — Z8669 Personal history of other diseases of the nervous system and sense organs: Secondary | ICD-10-CM | POA: Diagnosis not present

## 2014-12-17 DIAGNOSIS — Z79899 Other long term (current) drug therapy: Secondary | ICD-10-CM | POA: Diagnosis not present

## 2014-12-17 DIAGNOSIS — Z7951 Long term (current) use of inhaled steroids: Secondary | ICD-10-CM | POA: Diagnosis not present

## 2014-12-17 DIAGNOSIS — S7011XA Contusion of right thigh, initial encounter: Secondary | ICD-10-CM

## 2014-12-17 DIAGNOSIS — I10 Essential (primary) hypertension: Secondary | ICD-10-CM | POA: Diagnosis not present

## 2014-12-17 DIAGNOSIS — Z87891 Personal history of nicotine dependence: Secondary | ICD-10-CM | POA: Diagnosis not present

## 2014-12-17 DIAGNOSIS — S70311A Abrasion, right thigh, initial encounter: Secondary | ICD-10-CM | POA: Insufficient documentation

## 2014-12-17 DIAGNOSIS — Y990 Civilian activity done for income or pay: Secondary | ICD-10-CM | POA: Insufficient documentation

## 2014-12-17 DIAGNOSIS — E785 Hyperlipidemia, unspecified: Secondary | ICD-10-CM | POA: Insufficient documentation

## 2014-12-17 DIAGNOSIS — Y9389 Activity, other specified: Secondary | ICD-10-CM | POA: Diagnosis not present

## 2014-12-17 DIAGNOSIS — S8991XA Unspecified injury of right lower leg, initial encounter: Secondary | ICD-10-CM | POA: Diagnosis present

## 2014-12-17 LAB — CBC WITH DIFFERENTIAL/PLATELET
BASOS PCT: 0 %
Basophils Absolute: 0 10*3/uL (ref 0.0–0.1)
EOS ABS: 0.1 10*3/uL (ref 0.0–0.7)
EOS PCT: 2 %
HCT: 38.8 % — ABNORMAL LOW (ref 39.0–52.0)
HEMOGLOBIN: 13 g/dL (ref 13.0–17.0)
LYMPHS ABS: 2.3 10*3/uL (ref 0.7–4.0)
Lymphocytes Relative: 39 %
MCH: 27.2 pg (ref 26.0–34.0)
MCHC: 33.5 g/dL (ref 30.0–36.0)
MCV: 81.2 fL (ref 78.0–100.0)
Monocytes Absolute: 0.5 10*3/uL (ref 0.1–1.0)
Monocytes Relative: 8 %
NEUTROS PCT: 51 %
Neutro Abs: 3 10*3/uL (ref 1.7–7.7)
PLATELETS: 225 10*3/uL (ref 150–400)
RBC: 4.78 MIL/uL (ref 4.22–5.81)
RDW: 12.7 % (ref 11.5–15.5)
WBC: 5.9 10*3/uL (ref 4.0–10.5)

## 2014-12-17 LAB — I-STAT CHEM 8, ED
BUN: 11 mg/dL (ref 6–20)
CALCIUM ION: 1.16 mmol/L (ref 1.12–1.23)
CHLORIDE: 103 mmol/L (ref 101–111)
Creatinine, Ser: 1 mg/dL (ref 0.61–1.24)
Glucose, Bld: 240 mg/dL — ABNORMAL HIGH (ref 65–99)
HEMATOCRIT: 42 % (ref 39.0–52.0)
Hemoglobin: 14.3 g/dL (ref 13.0–17.0)
Potassium: 3.5 mmol/L (ref 3.5–5.1)
SODIUM: 141 mmol/L (ref 135–145)
TCO2: 23 mmol/L (ref 0–100)

## 2014-12-17 MED ORDER — OXYCODONE-ACETAMINOPHEN 5-325 MG PO TABS: 1.0000 | ORAL_TABLET | ORAL | Status: DC | PRN

## 2014-12-17 MED ORDER — ONDANSETRON HCL 4 MG/2ML IJ SOLN
4.0000 mg | Freq: Once | INTRAMUSCULAR | Status: AC
  Administered 2014-12-17: 4 mg via INTRAVENOUS
  Filled 2014-12-17: qty 2

## 2014-12-17 MED ORDER — FENTANYL CITRATE (PF) 100 MCG/2ML IJ SOLN
50.0000 ug | Freq: Once | INTRAMUSCULAR | Status: AC
  Administered 2014-12-17: 50 ug via INTRAVENOUS
  Filled 2014-12-17: qty 2

## 2014-12-17 MED ORDER — OXYCODONE-ACETAMINOPHEN 5-325 MG PO TABS
1.0000 | ORAL_TABLET | Freq: Once | ORAL | Status: AC
  Administered 2014-12-17: 1 via ORAL
  Filled 2014-12-17: qty 1

## 2014-12-17 MED ORDER — NAPROXEN 500 MG PO TABS: 500.0000 mg | ORAL_TABLET | Freq: Two times a day (BID) | ORAL | Status: DC

## 2014-12-17 MED ORDER — PROMETHAZINE HCL 25 MG PO TABS: 25.0000 mg | ORAL_TABLET | Freq: Four times a day (QID) | ORAL | Status: DC | PRN

## 2014-12-17 MED ORDER — HYDROMORPHONE HCL 1 MG/ML IJ SOLN
1.0000 mg | Freq: Once | INTRAMUSCULAR | Status: AC
  Administered 2014-12-17: 1 mg via INTRAVENOUS
  Filled 2014-12-17: qty 1

## 2014-12-17 NOTE — ED Provider Notes (Signed)
CSN: [id number]     Arrival date & time 12/17/14  1133 History   First MD Initiated Contact with Patient 12/17/14 1136     Chief Complaint  Patient presents with  . Leg Injury     (Consider location/radiation/quality/duration/timing/severity/associated sxs/prior Treatment) HPI Arthur [name] is a 49 y.o. male history of type 2 diabetes, hypertension, hyperlipidemia, presents to emergency department complaining of a fall. Patient states that he missed a step and fell in between a truck and loading deck. He states his body went forward but his right leg went through the hole and did not move. Patient's complaining of pain to the right thigh. He reports some numbness sensation to the right hip. He denies any numbness or weakness distal to the injury. He denies hitting his head. No loss of consciousness. No other injuries.  Past Medical History  Diagnosis Date  . Type II diabetes mellitus with renal manifestations, uncontrolled ([place]) 06/12/2014  . Erectile dysfunction associated with type 2 diabetes mellitus ([place])   . Diabetes mellitus with renal complications ([place])   . Hypertension   . Hyperlipidemia   . ED (erectile dysfunction)   . Exotropia   . Thoracic back pain    Past Surgical History  Procedure Laterality Date  . Shoulder surgery Right     bone spurs removed   Family History  Problem Relation Age of Onset  . Diabetes Father   . Diabetes Sister   . Lung disease Mother   . Diabetes Maternal Aunt    Social History  Substance Use Topics  . Smoking status: Former Smoker -- 1.00 packs/day for 20 years    Types: Cigarettes    Quit date: 01/05/1986  . Smokeless tobacco: Never Used  . Alcohol Use: No    Review of Systems  Constitutional: Negative for fever and chills.  Respiratory: Negative for cough, chest tightness and shortness of breath.   Cardiovascular: Negative for chest pain, palpitations and leg swelling.  Gastrointestinal: Negative for nausea, vomiting,  abdominal pain, diarrhea and abdominal distention.  Musculoskeletal: Positive for joint swelling and arthralgias. Negative for myalgias, neck pain and neck stiffness.  Skin: Negative for rash.  Allergic/Immunologic: Negative for immunocompromised state.  Neurological: Positive for numbness. Negative for dizziness, weakness, light-headedness and headaches.      Allergies  Review of patient's allergies indicates no known allergies.  Home Medications   Prior to Admission medications   Medication Sig Start Date End Date Taking? Authorizing Provider  fluticasone (FLONASE) 50 MCG/ACT nasal spray Place 2 sprays into both nostrils daily. 12/07/14   Arnetha Courser, MD  glucose blood test strip 1 each by Other route as needed for other. One touch ultra test strips. Use as instructed. 08/16/14   Arnetha Courser, MD  Insulin Glargine (LANTUS) 100 UNIT/ML Solostar Pen Inject 35 Units into the skin daily at 10 pm. 09/25/14   Arnetha Courser, MD  lisinopril (PRINIVIL,ZESTRIL) 10 MG tablet Take 1.5 tablets (15 mg total) by mouth daily. Appointment and labs needed in the next 2 weeks 08/16/14   Arnetha Courser, MD  metFORMIN (GLUCOPHAGE) 1000 MG tablet Take 1 tablet (1,000 mg total) by mouth 2 (two) times daily with a meal. 09/05/14   Arnetha Courser, MD  rosuvastatin (CRESTOR) 40 MG tablet Take 1 tablet (40 mg total) by mouth at bedtime. 09/05/14   Arnetha Courser, MD   BP 135/94 mmHg  Pulse 62  Temp(Src) 98.3 F (36.8 C) (Oral)  Resp 16  SpO2 99% Physical Exam  Constitutional: He appears well-developed and well-nourished. No distress.  HENT:  Head: Normocephalic and atraumatic.  Eyes: Conjunctivae are normal.  Neck: Neck supple.  Cardiovascular: Normal rate, regular rhythm and normal heart sounds.   Pulmonary/Chest: Effort normal. No respiratory distress. He has no wheezes. He has no rales.  Abdominal: There is no tenderness. There is no rebound.  Musculoskeletal: He exhibits no edema.  Abrasion and a  digital right lateral thigh with swelling. Tender to palpation over  right femur. normal appearing knee with no swelling. No joint tenderness. Normal ankle and foot. Dorsal pedal pulses intact. Cap refill less than 2 seconds distally.   Neurological: He is alert.  Skin: Skin is warm and dry.  Nursing note and vitals reviewed.   ED Course  Procedures (including critical care time) Labs Review Labs Reviewed  CBC WITH DIFFERENTIAL/PLATELET - Abnormal; Notable for the following:    HCT 38.8 (*)    All other components within normal limits  I-STAT CHEM 8, ED - Abnormal; Notable for the following:    Glucose, Bld 240 (*)    All other components within normal limits    Imaging Review Dg Femur Port, Min 2 Views Right  12/17/2014  CLINICAL DATA:  Right leg pain after a fall through an opening in a floor today. EXAM: RIGHT FEMUR PORTABLE 1 VIEW COMPARISON:  None. FINDINGS: There is no evidence of fracture or other focal bone lesions. Soft tissues are unremarkable. IMPRESSION: Negative. Electronically Signed   By: Lorriane Shire M.D.   On: 12/17/2014 12:51   I have personally reviewed and evaluated these images and lab results as part of my medical decision-making.   EKG Interpretation None      MDM   Final diagnoses:  Quadriceps contusion, right, initial encounter  Right leg pain    Pt presents to ED with right thigh injury. No other injuries, no head injury. Neurovascularly intact. Large abrasion to right lateral quadricep. Patella tendon appears to be intact. Pain with any movement of the leg. xrays ordered.    Pt's xray negative. Pt ambulated with crutches and immobilizer. Home with percocet, naprosyn, follow up with orthopedics. Non weight bearing until follow up. Low concern for compartment syndrome, neurovascularly intact.   Filed Vitals:   12/17/14 1152 12/17/14 1212 12/17/14 1257 12/17/14 1405  BP: 129/97 135/94 147/97 133/66  Pulse: 66 62 75 77  Temp: 98.3 F (36.8 C)      TempSrc: Oral     Resp: 16 16 18 16   SpO2: 100% 99% 98% 98%       Jeannett Senior, PA-C 12/17/14 [place], MD 12/23/14 [phone]

## 2014-12-17 NOTE — Discharge Instructions (Signed)
Take naprosyn for pain. Take percocet as prescribed as needed for severe pain. Phenergan for nausea. Follow up with orthopedics specialist if not improving. Return if any numbness or weakness or pain to the foot.   Quadriceps Contusion A quadriceps contusion is a deep bruise of the large muscle in the front of your thigh. Contusions are the result of an injury that caused bleeding under the skin. The contusion may turn blue, purple, or yellow. Minor injuries will give you a painless contusion, but more severe contusions may stay painful and swollen for a few weeks. It is necessary to follow your caregiver's directions when this muscle is bruised.  CAUSES A quadriceps contusion comes from a blow or injury to the front of the leg. SYMPTOMS   Swelling and redness of the thigh area.  Bruising of the thigh area.  Tenderness or soreness of the thigh.  Limping.  Leg stiffness.  Difficulty bending the leg.  Trouble walking. DIAGNOSIS  You will have a physical exam and will be asked about your history. You may need an X-ray of your leg. TREATMENT  Often, the best treatment for a quadriceps contusion is resting and elevating the leg and applying cold compresses to the thigh area. Over-the-counter medicines may also be recommended for pain control. You may need crutches, an elastic wrap, or a leg splint.  HOME CARE INSTRUCTIONS   Put ice on the injured area.  Put ice in a plastic bag.  Place a towel between your skin and the bag.  Leave the ice on for 15-20 minutes, 03-04 times a day.  Only take over-the-counter or prescription medicines for pain, discomfort, or fever as directed by your caregiver.  Rest the injured thigh until the pain and swelling are better.  Elevate your leg to reduce swelling. Lie down flat on your back and place a pillow under your knee.  Apply compression wraps as directed by your caregiver. You may remove it for sleeping, showers, and baths. If your toes become  numb, cold, or blue, take the wrap off and reapply it more loosely.  Walk or move around as the pain allows, or as directed by your caregiver. Resume full activities only when your caregiver says it is okay. Returning to your usual activities before your caregiver approves may cause worse damage to the muscle.  See your caregiver as directed. It is very important to keep all follow-up referrals and appointments in order to avoid any long-term problems with your leg, including chronic pain or inability to move your leg normally. SEEK MEDICAL CARE IF:   You have increased bruising or swelling.  You have pain that is getting worse.  Your swelling or pain is not relieved by medicines.  Your toes or foot become cold or turn bluish in color.  You notice your thigh getting larger in size. MAKE SURE YOU:   Understand these instructions.  Will watch your condition.  Will get help right away if you are not doing well or get worse.   This information is not intended to replace advice given to you by your health care provider. Make sure you discuss any questions you have with your health care provider.   Document Released: 09/16/2000 Document Revised: 01/12/2014 Document Reviewed: 05/09/2014 Elsevier Interactive Patient Education [employer].

## 2014-12-17 NOTE — ED Notes (Signed)
From work via [employer], pt stepped in opening, fell forward and has apparent right distal femur fx, good CMS, no other injuries, VSS  214mcg Fentanyl

## 2014-12-17 NOTE — Progress Notes (Signed)
Orthopedic Tech Progress Note Patient Details:  Arthur [name] 1965-02-27 [id number]  Ortho Devices Type of Ortho Device: Knee Immobilizer, Crutches Ortho Device/Splint Interventions: Application   [place] Pink 12/17/2014, 1:44 PM

## 2015-01-08 ENCOUNTER — Other Ambulatory Visit: Payer: Self-pay | Admitting: Family Medicine

## 2015-01-08 NOTE — Telephone Encounter (Signed)
Patient wants to know if he can get a 90 day supply send to [facility] because it cost him less if he does it this way. Please call patient with an answer, thanks.

## 2015-01-09 MED ORDER — LISINOPRIL 10 MG PO TABS: 15.0000 mg | ORAL_TABLET | Freq: Every day | ORAL | Status: DC

## 2015-01-09 NOTE — Telephone Encounter (Signed)
Cr and K+ from Dec 07, 2014 reviewed; Rx approved ---------------------- I see patient asked for return call; please let him know what I sent If others are needed, back to me please Thank you

## 2015-01-09 NOTE — Telephone Encounter (Signed)
As far as I can tell, everything else has refills thru his mail order except for the Lisinopril.

## 2015-01-09 NOTE — Telephone Encounter (Signed)
Left message on patients identified voicemail.

## 2015-01-09 NOTE — Telephone Encounter (Signed)
Please ask patient to get this from Dr. Gabriel Carina now, since she is managing his diabetes; thank you

## 2015-01-09 NOTE — Telephone Encounter (Signed)
Patient notified.  He also need the Lantus as a 90day supply.

## 2015-01-23 LAB — BASIC METABOLIC PANEL
BUN/Creatinine Ratio: 9 (ref 9–20)
BUN: 10 mg/dL (ref 6–24)
CO2: 24 mmol/L (ref 18–29)
Calcium: 9.6 mg/dL (ref 8.7–10.2)
Chloride: 104 mmol/L (ref 97–106)
Creatinine, Ser: 1.11 mg/dL (ref 0.76–1.27)
GFR, EST AFRICAN AMERICAN: 88 mL/min/{1.73_m2} (ref >59)
GFR, EST NON AFRICAN AMERICAN: 76 mL/min/{1.73_m2} (ref >59)
Glucose: 71 mg/dL (ref 65–99)
POTASSIUM: 4 mmol/L (ref 3.5–5.2)
SODIUM: 143 mmol/L (ref 136–144)

## 2015-01-23 LAB — LIPID PANEL W/O CHOL/HDL RATIO
CHOLESTEROL TOTAL: 138 mg/dL (ref 100–199)
HDL: 40 mg/dL (ref >39)
LDL Calculated: 81 mg/dL (ref 0–99)
Triglycerides: 85 mg/dL (ref 0–149)
VLDL CHOLESTEROL CAL: 17 mg/dL (ref 5–40)

## 2015-01-23 LAB — ALT: ALT: 30 IU/L (ref 0–44)

## 2015-03-08 ENCOUNTER — Encounter: Payer: Self-pay | Admitting: Family Medicine

## 2015-03-08 ENCOUNTER — Ambulatory Visit: Payer: BLUE CROSS/BLUE SHIELD | Admitting: Family Medicine

## 2015-03-09 ENCOUNTER — Other Ambulatory Visit: Payer: Self-pay | Admitting: Family Medicine

## 2015-03-09 DIAGNOSIS — E1122 Type 2 diabetes mellitus with diabetic chronic kidney disease: Secondary | ICD-10-CM

## 2015-03-09 DIAGNOSIS — E1165 Type 2 diabetes mellitus with hyperglycemia: Principal | ICD-10-CM

## 2015-03-09 DIAGNOSIS — Z794 Long term (current) use of insulin: Principal | ICD-10-CM

## 2015-03-09 DIAGNOSIS — IMO0002 Reserved for concepts with insufficient information to code with codable children: Secondary | ICD-10-CM

## 2015-03-09 DIAGNOSIS — N182 Chronic kidney disease, stage 2 (mild): Principal | ICD-10-CM

## 2015-03-09 MED ORDER — METFORMIN HCL 1000 MG PO TABS: 1000.0000 mg | ORAL_TABLET | Freq: Two times a day (BID) | ORAL | Status: DC

## 2015-03-09 MED ORDER — ROSUVASTATIN CALCIUM 40 MG PO TABS: 40.0000 mg | ORAL_TABLET | Freq: Every day | ORAL | Status: DC

## 2015-03-09 NOTE — Telephone Encounter (Signed)
Patient no showed for appt yesterday He should be seeing endocrinologist for his diabetes by now Care Everywhere reviewed and I don't see that he's gone to endo yet I'll send 10 day supply of requested meds to local pharmacy, but he needs to be seen and have fasting labs please

## 2015-03-11 NOTE — Telephone Encounter (Signed)
Called patient and no answer left vm to call us back and schedule a f/u appt with fasting labs. Refill has been send to pharmacy.

## 2015-03-14 ENCOUNTER — Encounter: Payer: Self-pay | Admitting: Family Medicine

## 2015-03-14 NOTE — Telephone Encounter (Signed)
Letter sent home 03/14/15 °

## 2015-03-25 ENCOUNTER — Ambulatory Visit: Payer: BLUE CROSS/BLUE SHIELD | Admitting: Family Medicine

## 2015-03-27 ENCOUNTER — Other Ambulatory Visit: Payer: Self-pay

## 2015-03-27 NOTE — Telephone Encounter (Signed)
He is out of Lantus. Needs enough to cover him until he comes in on Friday.

## 2015-03-27 NOTE — Telephone Encounter (Signed)
Arthur [name], the refill request says 35 units but the instructions in med list say 50 units; please clarify with patient so I can send the correct amount; thanks

## 2015-03-27 NOTE — Telephone Encounter (Signed)
Left message to call for clarification.

## 2015-03-28 MED ORDER — INSULIN GLARGINE 100 UNIT/ML SOLOSTAR PEN: PEN_INJECTOR | SUBCUTANEOUS | Status: DC

## 2015-03-28 NOTE — Telephone Encounter (Signed)
I did not hear back from patient; sending one pen with range and note to pharmacist that we'll want patient to clarify instructions

## 2015-03-29 ENCOUNTER — Other Ambulatory Visit: Payer: Self-pay

## 2015-03-29 ENCOUNTER — Ambulatory Visit: Payer: BLUE CROSS/BLUE SHIELD | Admitting: Family Medicine

## 2015-03-29 MED ORDER — INSULIN GLARGINE 100 UNIT/ML SOLOSTAR PEN: PEN_INJECTOR | SUBCUTANEOUS | Status: DC

## 2015-03-29 NOTE — Telephone Encounter (Signed)
I updated the sig and sent refills

## 2015-03-29 NOTE — Telephone Encounter (Signed)
Patient called back and said he is taking 50 units a day on the Lantus. Pharmacy needs a new rx stating the correct directions.

## 2015-04-04 ENCOUNTER — Ambulatory Visit (INDEPENDENT_AMBULATORY_CARE_PROVIDER_SITE_OTHER): Payer: BLUE CROSS/BLUE SHIELD | Admitting: Family Medicine

## 2015-04-04 ENCOUNTER — Encounter: Payer: Self-pay | Admitting: Family Medicine

## 2015-04-04 VITALS — BP 125/81 | HR 80 | Temp 98.4°F | Ht 66.4 in | Wt 174.0 lb

## 2015-04-04 DIAGNOSIS — E1121 Type 2 diabetes mellitus with diabetic nephropathy: Secondary | ICD-10-CM

## 2015-04-04 DIAGNOSIS — J209 Acute bronchitis, unspecified: Secondary | ICD-10-CM | POA: Diagnosis not present

## 2015-04-04 DIAGNOSIS — Z794 Long term (current) use of insulin: Secondary | ICD-10-CM | POA: Diagnosis not present

## 2015-04-04 DIAGNOSIS — E1165 Type 2 diabetes mellitus with hyperglycemia: Secondary | ICD-10-CM

## 2015-04-04 DIAGNOSIS — R062 Wheezing: Secondary | ICD-10-CM

## 2015-04-04 DIAGNOSIS — R52 Pain, unspecified: Secondary | ICD-10-CM

## 2015-04-04 DIAGNOSIS — IMO0002 Reserved for concepts with insufficient information to code with codable children: Secondary | ICD-10-CM

## 2015-04-04 LAB — VERITOR FLU A/B WAIVED
INFLUENZA B: NEGATIVE
Influenza A: NEGATIVE

## 2015-04-04 LAB — BAYER DCA HB A1C WAIVED: HB A1C (BAYER DCA - WAIVED): 13.7 % — ABNORMAL HIGH (ref <7.0)

## 2015-04-04 MED ORDER — ALBUTEROL SULFATE (2.5 MG/3ML) 0.083% IN NEBU: 2.5000 mg | INHALATION_SOLUTION | Freq: Once | RESPIRATORY_TRACT | Status: DC

## 2015-04-04 MED ORDER — DOXYCYCLINE HYCLATE 100 MG PO TABS: 100.0000 mg | ORAL_TABLET | Freq: Two times a day (BID) | ORAL | Status: DC

## 2015-04-04 MED ORDER — ALBUTEROL SULFATE HFA 108 (90 BASE) MCG/ACT IN AERS: 2.0000 | INHALATION_SPRAY | Freq: Four times a day (QID) | RESPIRATORY_TRACT | Status: DC | PRN

## 2015-04-04 NOTE — Assessment & Plan Note (Addendum)
Did not see endocrinology after last appointment. Has been working on bringing A1c down, but it is going in the wrong direction, was not doing as well the past 3 months. A1c came back today at 13.7. Will give him 3 months to try to get A1c back under better control, but will change him from his lantus to soliuqa today for the GLP-1. Will start 30 units of soliqua daily and call and check in on sugars in 1 week to titrate up 2-4 units weekly.

## 2015-04-04 NOTE — Progress Notes (Signed)
BP 125/81 mmHg  Pulse 80  Temp(Src) 98.4 F (36.9 C)  Ht 5' 6.4" (1.687 m)  Wt 174 lb (78.926 kg)  BMI 27.73 kg/m2  SpO2 98%   Subjective:    Patient ID: Arthur [name], male    DOB: [birth year], 50 y.o.   MRN: [id number]  HPI: Arthur [name] is a 50 y.o. male  Chief Complaint  Patient presents with  . Diabetes   DIABETES- got injured since his last appointment. Due to have surgery in the next few weeks. He is back on track, but has been feeling sick the past week or so, had missed several doses of his medicine, was a period where he wasn't checking his sugars for a while due to financial issues.  Hypoglycemic episodes:no Polydipsia/polyuria: no Visual disturbance: no Chest pain: no Paresthesias: no Glucose Monitoring: yes  Accucheck frequency: BID Taking Insulin?: yes  Long acting insulin: yes, lantus  Short acting insulin: No Blood Pressure Monitoring: not checking Retinal Examination: Up to Date Foot Exam: Up to Date Diabetic Education: Completed Pneumovax: Up to Date Influenza: Up to Date Aspirin: no  UPPER RESPIRATORY TRACT INFECTION Duration: about a week Worst symptom: cough Fever: yes, subjective Cough: yes Shortness of breath: no Wheezing: yes Chest pain: yes, with cough Chest tightness: no Chest congestion: yes Nasal congestion: yes Runny nose: yes Post nasal drip: yes Sneezing: yes Sore throat: yes Swollen glands: no Sinus pressure: yes Headache: yes Face pain: no Toothache: no Ear pain: no  Ear pressure: yes bilateral Eyes red/itching:yes Eye drainage/crusting: yes  Vomiting: yes Rash: no Fatigue: yes Sick contacts: no Strep contacts: no  Context: worse Recurrent sinusitis: no Relief with OTC cold/cough medications: no  Treatments attempted: cold/sinus and anti-histamine    Relevant past medical, surgical, family and social history reviewed and updated as indicated. Interim medical history since our last visit reviewed. Allergies  and medications reviewed and updated.  Review of Systems  Constitutional: Positive for fever and fatigue. Negative for chills, diaphoresis, activity change, appetite change and unexpected weight change.  HENT: Positive for congestion, ear pain, postnasal drip, rhinorrhea, sinus pressure, sneezing and sore throat. Negative for dental problem, drooling, ear discharge, facial swelling, hearing loss, mouth sores, nosebleeds, tinnitus, trouble swallowing and voice change.   Eyes: Positive for redness and itching. Negative for photophobia, pain, discharge and visual disturbance.  Respiratory: Positive for cough, chest tightness, shortness of breath and wheezing. Negative for apnea, choking and stridor.   Cardiovascular: Negative.   Psychiatric/Behavioral: Negative.     Per HPI unless specifically indicated above     Objective:    BP 125/81 mmHg  Pulse 80  Temp(Src) 98.4 F (36.9 C)  Ht 5' 6.4" (1.687 m)  Wt 174 lb (78.926 kg)  BMI 27.73 kg/m2  SpO2 98%  Wt Readings from Last 3 Encounters:  04/04/15 174 lb (78.926 kg)  12/07/14 169 lb (76.658 kg)  09/05/14 171 lb (77.565 kg)    Physical Exam  Constitutional: He is oriented to person, place, and time. He appears well-developed and well-nourished. No distress.  HENT:  Head: Normocephalic and atraumatic.  Right Ear: Hearing and external ear normal.  Left Ear: Hearing and external ear normal.  Nose: Nose normal.  Mouth/Throat: Oropharynx is clear and moist. No oropharyngeal exudate.  Eyes: Conjunctivae, EOM and lids are normal. Pupils are equal, round, and reactive to light. Right eye exhibits no discharge. Left eye exhibits no discharge. No scleral icterus.  Neck: Normal range of motion. Neck supple.  No JVD present. No tracheal deviation present. No thyromegaly present.  Cardiovascular: Normal rate, regular rhythm, normal heart sounds and intact distal pulses.  Exam reveals no gallop and no friction rub.   No murmur  heard. Pulmonary/Chest: Effort normal. No stridor. No respiratory distress. He has no decreased breath sounds. He has wheezes in the right upper field, the right middle field, the right lower field, the left upper field, the left middle field and the left lower field. He has rhonchi in the right upper field, the right middle field, the right lower field, the left upper field, the left middle field and the left lower field. He has no rales. He exhibits no tenderness.  Musculoskeletal: Normal range of motion.  Lymphadenopathy:    He has no cervical adenopathy.  Neurological: He is alert and oriented to person, place, and time.  Skin: Skin is warm and intact. No rash noted. He is not diaphoretic. No erythema. No pallor.  Psychiatric: He has a normal mood and affect. His speech is normal and behavior is normal. Judgment and thought content normal. Cognition and memory are normal.  Nursing note and vitals reviewed.   Results for orders placed or performed during the hospital encounter of 12/17/14  CBC with Differential  Result Value Ref Range   WBC 5.9 4.0 - 10.5 K/uL   RBC 4.78 4.22 - 5.81 MIL/uL   Hemoglobin 13.0 13.0 - 17.0 g/dL   HCT 38.8 (L) 39.0 - 52.0 %   MCV 81.2 78.0 - 100.0 fL   MCH 27.2 26.0 - 34.0 pg   MCHC 33.5 30.0 - 36.0 g/dL   RDW 12.7 11.5 - 15.5 %   Platelets 225 150 - 400 K/uL   Neutrophils Relative % 51 %   Neutro Abs 3.0 1.7 - 7.7 K/uL   Lymphocytes Relative 39 %   Lymphs Abs 2.3 0.7 - 4.0 K/uL   Monocytes Relative 8 %   Monocytes Absolute 0.5 0.1 - 1.0 K/uL   Eosinophils Relative 2 %   Eosinophils Absolute 0.1 0.0 - 0.7 K/uL   Basophils Relative 0 %   Basophils Absolute 0.0 0.0 - 0.1 K/uL  I-Stat Chem 8, ED  Result Value Ref Range   Sodium 141 135 - 145 mmol/L   Potassium 3.5 3.5 - 5.1 mmol/L   Chloride 103 101 - 111 mmol/L   BUN 11 6 - 20 mg/dL   Creatinine, Ser 1.00 0.61 - 1.24 mg/dL   Glucose, Bld 240 (H) 65 - 99 mg/dL   Calcium, Ion 1.16 1.12 - 1.23  mmol/L   TCO2 23 0 - 100 mmol/L   Hemoglobin 14.3 13.0 - 17.0 g/dL   HCT 42.0 39.0 - 52.0 %      Assessment & Plan:   Problem List Items Addressed This Visit      Endocrine   Type II diabetes mellitus with renal manifestations, uncontrolled ([place]) - Primary    Did not see endocrinology after last appointment. Has been working on bringing A1c down, but it is going in the wrong direction, was not doing as well the past 3 months. A1c came back today at 13.7. Will give him 3 months to try to get A1c back under better control, but will change him from his lantus to soliuqa today for the GLP-1. Will start 30 units of soliqua daily and call and check in on sugars in 1 week to titrate up 2-4 units weekly.      Relevant Orders   [facility]  Hb A1c Waived    Other Visit Diagnoses    Body aches        Flu negative. Significant wheezing.     Relevant Orders    Veritor Flu A/B Waived    Wheezing        No better after neb. Will treat as above.     Relevant Medications    albuterol (PROVENTIL) (2.5 MG/3ML) 0.083% nebulizer solution 2.5 mg    Acute bronchitis, unspecified organism        Will treat with doxycycline and inhaler. Will need spiro next visit.         Follow up plan: Return in about 2 weeks (around 04/18/2015) for lung recheck.

## 2015-04-12 ENCOUNTER — Telehealth: Payer: Self-pay | Admitting: Family Medicine

## 2015-04-12 NOTE — Telephone Encounter (Signed)
-----   Message from Valerie Roys, DO sent at 04/04/2015  3:32 PM EDT ----- Call to check in on blood sugars to titrate soliqua up

## 2015-04-12 NOTE — Telephone Encounter (Signed)
LMOM for him to call, will increase by 2-4 units depending on sugar readings

## 2015-04-17 NOTE — Telephone Encounter (Signed)
Unable to contact patient by phone. Letter generated and sent out.

## 2015-04-18 ENCOUNTER — Ambulatory Visit: Payer: BLUE CROSS/BLUE SHIELD | Admitting: Family Medicine

## 2015-04-22 ENCOUNTER — Encounter: Payer: Self-pay | Admitting: Family Medicine

## 2015-04-22 ENCOUNTER — Ambulatory Visit (INDEPENDENT_AMBULATORY_CARE_PROVIDER_SITE_OTHER): Payer: BLUE CROSS/BLUE SHIELD | Admitting: Family Medicine

## 2015-04-22 VITALS — BP 123/85 | HR 71 | Temp 97.7°F | Ht 66.4 in | Wt 172.0 lb

## 2015-04-22 DIAGNOSIS — E1121 Type 2 diabetes mellitus with diabetic nephropathy: Secondary | ICD-10-CM | POA: Diagnosis not present

## 2015-04-22 DIAGNOSIS — Z794 Long term (current) use of insulin: Secondary | ICD-10-CM

## 2015-04-22 DIAGNOSIS — E1165 Type 2 diabetes mellitus with hyperglycemia: Secondary | ICD-10-CM

## 2015-04-22 DIAGNOSIS — IMO0002 Reserved for concepts with insufficient information to code with codable children: Secondary | ICD-10-CM

## 2015-04-22 MED ORDER — INSULIN GLARGINE-LIXISENATIDE 100-33 UNT-MCG/ML ~~LOC~~ SOPN: 30.0000 [IU] | PEN_INJECTOR | Freq: Every day | SUBCUTANEOUS | Status: DC

## 2015-04-22 NOTE — Progress Notes (Signed)
BP 123/85 mmHg  Pulse 71  Temp(Src) 97.7 F (36.5 C)  Ht 5' 6.4" (1.687 m)  Wt 172 lb (78.019 kg)  BMI 27.41 kg/m2  SpO2 98%   Subjective:    Patient ID: Arthur [name], male    DOB: 1965/05/28, 50 y.o.   MRN: [id number]  HPI: Arthur [name] is a 50 y.o. male  Chief Complaint  Patient presents with  . lung recheck  . Diabetes    Patient states that he is out of the sample pen that you had given him   DIABETES- Started the soliqua and liked it, got up to the 50 units Hypoglycemic episodes:no Polydipsia/polyuria: no Visual disturbance: no Chest pain: no Paresthesias: no Glucose Monitoring: yes  Accucheck frequency: BID  Fasting glucose: 80s-110 Taking Insulin?: yes Blood Pressure Monitoring: not checking Retinal Examination: Up to Date Foot Exam: Up to Date Diabetic Education: Completed Pneumovax: Up to Date Influenza: Up to Date Aspirin: yes  Relevant past medical, surgical, family and social history reviewed and updated as indicated. Interim medical history since our last visit reviewed. Allergies and medications reviewed and updated.  Review of Systems  Constitutional: Negative.   Respiratory: Negative.   Cardiovascular: Negative.   Gastrointestinal: Negative.   Psychiatric/Behavioral: Negative.     Per HPI unless specifically indicated above     Objective:    BP 123/85 mmHg  Pulse 71  Temp(Src) 97.7 F (36.5 C)  Ht 5' 6.4" (1.687 m)  Wt 172 lb (78.019 kg)  BMI 27.41 kg/m2  SpO2 98%  Wt Readings from Last 3 Encounters:  04/22/15 172 lb (78.019 kg)  04/04/15 174 lb (78.926 kg)  12/07/14 169 lb (76.658 kg)    Physical Exam  Constitutional: He is oriented to person, place, and time. He appears well-developed and well-nourished. No distress.  HENT:  Head: Normocephalic and atraumatic.  Right Ear: Hearing normal.  Left Ear: Hearing normal.  Nose: Nose normal.  Eyes: Conjunctivae and lids are normal. Right eye exhibits no discharge. Left eye  exhibits no discharge. No scleral icterus.  Cardiovascular: Normal rate, regular rhythm, normal heart sounds and intact distal pulses.  Exam reveals no gallop and no friction rub.   No murmur heard. Pulmonary/Chest: Effort normal and breath sounds normal. No respiratory distress. He has no wheezes. He has no rales. He exhibits no tenderness.  Musculoskeletal: Normal range of motion.  Neurological: He is alert and oriented to person, place, and time.  Skin: Skin is warm, dry and intact. No rash noted. No erythema. No pallor.  Psychiatric: He has a normal mood and affect. His speech is normal and behavior is normal. Judgment and thought content normal. Cognition and memory are normal.  Nursing note and vitals reviewed.   Results for orders placed or performed in visit on 04/04/15  Veritor Flu A/B Waived  Result Value Ref Range   Influenza A Negative Negative   Influenza B Negative Negative  Bayer DCA Hb A1c Waived  Result Value Ref Range   Bayer DCA Hb A1c Waived 13.7 (H) <7.0 %      Assessment & Plan:   Problem List Items Addressed This Visit      Endocrine   Type II diabetes mellitus with renal manifestations, uncontrolled ([place]) - Primary    Will continue soliuqa, sugars doing well. Increase by 4 units weekly to 60 units daily. Recheck in 10 weeks with A1c. Call with any concerns.       Relevant Medications   Insulin Glargine-Lixisenatide (  [place]) 100-33 UNT-MCG/ML SOPN       Follow up plan: Return in about 10 weeks (around 07/01/2015) for DM visit.

## 2015-04-22 NOTE — Assessment & Plan Note (Signed)
Will continue soliuqa, sugars doing well. Increase by 4 units weekly to 60 units daily. Recheck in 10 weeks with A1c. Call with any concerns.

## 2015-05-02 ENCOUNTER — Encounter: Payer: Self-pay | Admitting: Family Medicine

## 2015-05-28 DIAGNOSIS — E119 Type 2 diabetes mellitus without complications: Secondary | ICD-10-CM | POA: Insufficient documentation

## 2015-05-29 ENCOUNTER — Telehealth: Payer: Self-pay | Admitting: Family Medicine

## 2015-05-29 DIAGNOSIS — IMO0002 Reserved for concepts with insufficient information to code with codable children: Secondary | ICD-10-CM

## 2015-05-29 DIAGNOSIS — Z794 Long term (current) use of insulin: Principal | ICD-10-CM

## 2015-05-29 DIAGNOSIS — N182 Chronic kidney disease, stage 2 (mild): Principal | ICD-10-CM

## 2015-05-29 DIAGNOSIS — E1122 Type 2 diabetes mellitus with diabetic chronic kidney disease: Secondary | ICD-10-CM

## 2015-05-29 DIAGNOSIS — E1165 Type 2 diabetes mellitus with hyperglycemia: Principal | ICD-10-CM

## 2015-05-29 MED ORDER — METFORMIN HCL 1000 MG PO TABS: 1000.0000 mg | ORAL_TABLET | Freq: Two times a day (BID) | ORAL | Status: DC

## 2015-05-29 MED ORDER — ROSUVASTATIN CALCIUM 40 MG PO TABS: 40.0000 mg | ORAL_TABLET | Freq: Every day | ORAL | Status: DC

## 2015-05-29 MED ORDER — LISINOPRIL 10 MG PO TABS: 15.0000 mg | ORAL_TABLET | Freq: Every day | ORAL | Status: DC

## 2015-05-29 NOTE — Telephone Encounter (Signed)
Refill request from [facility] for  Lisinopril 10mg  daily Crestor 40mg  daily Metformin 1000mg  BID  All 3 sent to his pharmacy

## 2015-07-01 ENCOUNTER — Ambulatory Visit: Payer: BLUE CROSS/BLUE SHIELD | Admitting: Family Medicine

## 2015-07-31 ENCOUNTER — Encounter: Payer: Self-pay | Admitting: Family Medicine

## 2015-07-31 ENCOUNTER — Ambulatory Visit (INDEPENDENT_AMBULATORY_CARE_PROVIDER_SITE_OTHER): Payer: BLUE CROSS/BLUE SHIELD | Admitting: Family Medicine

## 2015-07-31 ENCOUNTER — Other Ambulatory Visit: Payer: Self-pay

## 2015-07-31 VITALS — BP 123/85 | HR 121 | Temp 101.6°F | Wt 162.0 lb

## 2015-07-31 DIAGNOSIS — J101 Influenza due to other identified influenza virus with other respiratory manifestations: Secondary | ICD-10-CM | POA: Diagnosis not present

## 2015-07-31 LAB — CBC WITH DIFFERENTIAL/PLATELET
HEMATOCRIT: 40.8 % (ref 37.5–51.0)
HEMOGLOBIN: 14.8 g/dL (ref 12.6–17.7)
LYMPHS ABS: 0.6 10*3/uL — AB (ref 0.7–3.1)
LYMPHS: 8 %
MCH: 28.5 pg (ref 26.6–33.0)
MCHC: 36.3 g/dL — ABNORMAL HIGH (ref 31.5–35.7)
MCV: 79 fL (ref 79–97)
MID (ABSOLUTE): 0.5 10*3/uL (ref 0.1–1.6)
MID: 6 %
Neutrophils Absolute: 7.4 10*3/uL — ABNORMAL HIGH (ref 1.4–7.0)
Neutrophils: 86 %
Platelets: 203 10*3/uL (ref 150–379)
RBC: 5.2 x10E6/uL (ref 4.14–5.80)
RDW: 13.8 % (ref 12.3–15.4)
WBC: 8.5 10*3/uL (ref 3.4–10.8)

## 2015-07-31 LAB — VERITOR FLU A/B WAIVED
INFLUENZA B: NEGATIVE
Influenza A: POSITIVE — AB

## 2015-07-31 MED ORDER — OSELTAMIVIR PHOSPHATE 75 MG PO CAPS: 75.0000 mg | ORAL_CAPSULE | Freq: Two times a day (BID) | ORAL | 0 refills | Status: DC

## 2015-07-31 NOTE — Progress Notes (Signed)
   BP 123/85   Pulse (!) 121   Temp (!) 101.6 F (38.7 C)   Wt 162 lb (73.5 kg)   SpO2 100%   BMI 25.83 kg/m    Subjective:    Patient ID: Arthur [name], male    DOB: 1965-08-04, 50 y.o.   MRN: [id number]  HPI: Arthur [name] is a 50 y.o. male  Chief Complaint  Patient presents with  . URI    sick for several days, fever, chills, productive cough, chest congestion, headache, nausea. feels horrible/body aches.   Patient presents with 2 day history of high fever, severe body aches/chills, HA, N, and cough with chest congestion. Just got back last night from a cruise that went to [place], someone sitting at their table had been sick while on the ship. Has not been taking anything for symptoms. Denies abdominal pain, diarrhea, or vomiting.   Relevant past medical, surgical, family and social history reviewed and updated as indicated. Interim medical history since our last visit reviewed. Allergies and medications reviewed and updated.  Review of Systems  Constitutional: Positive for appetite change, chills, diaphoresis, fatigue and fever.  HENT: Positive for congestion.   Respiratory: Positive for cough.   Cardiovascular: Negative.   Gastrointestinal: Positive for nausea.  Genitourinary: Negative.   Musculoskeletal: Positive for myalgias.  Neurological: Negative.   Psychiatric/Behavioral: Negative.     Per HPI unless specifically indicated above     Objective:    BP 123/85   Pulse (!) 121   Temp (!) 101.6 F (38.7 C)   Wt 162 lb (73.5 kg)   SpO2 100%   BMI 25.83 kg/m   Wt Readings from Last 3 Encounters:  07/31/15 162 lb (73.5 kg)  04/22/15 172 lb (78 kg)  04/04/15 174 lb (78.9 kg)    Physical Exam  Constitutional: He appears well-developed and well-nourished. He appears distressed.  Laying in winter coat asleep on exam table  HENT:  Head: Atraumatic.  Eyes: Conjunctivae are normal. No scleral icterus.  Neck: Normal range of motion.  Cardiovascular:  Normal heart sounds.   Tachycardic at 121 bpm  Pulmonary/Chest: Effort normal.  Musculoskeletal: Normal range of motion.  Skin:  Hot to the touch, face is flushed  Psychiatric: He has a normal mood and affect.   CBC WNL in office today.      Assessment & Plan:   Problem List Items Addressed This Visit    None    Visit Diagnoses    Influenza A    -  Primary   Relevant Medications   oseltamivir (TAMIFLU) 75 MG capsule   Other Relevant Orders   CBC With Differential/Platelet   Veritor Flu A/B Waived   Comprehensive metabolic panel    Recommended rest, tylenol and ibuprofen for fever and body aches, and good fluid intake over the next 3-5 days. Tamiflu sent, and prophylactic dose sent in for wife. Recommended they call their childrens' pediatrician to have some sent in for them as well.   Follow up plan: Return if symptoms worsen or fail to improve.

## 2015-07-31 NOTE — Patient Instructions (Signed)
Follow up if no improvement 

## 2015-09-06 ENCOUNTER — Encounter: Payer: BLUE CROSS/BLUE SHIELD | Admitting: Family Medicine

## 2015-09-27 DIAGNOSIS — E113293 Type 2 diabetes mellitus with mild nonproliferative diabetic retinopathy without macular edema, bilateral: Secondary | ICD-10-CM | POA: Diagnosis not present

## 2015-09-27 LAB — HM DIABETES EYE EXAM

## 2015-10-17 ENCOUNTER — Telehealth: Payer: Self-pay

## 2015-10-17 NOTE — Telephone Encounter (Signed)
Patient is past due for a Diabetic follow up, please get patient scheduled, then notify Dr.Johnson, so that she can send in enough medication to get him through to his appointment.  Dr.Johnson please send enough SOLIQUA 100/33 to [facility]

## 2015-10-21 NOTE — Telephone Encounter (Signed)
Christan: Have you been able to get patient scheduled.

## 2015-10-21 NOTE — Telephone Encounter (Signed)
I have not been able to reach the pt but I left a message for him to call the office.

## 2015-10-22 ENCOUNTER — Telehealth: Payer: Self-pay | Admitting: Family Medicine

## 2015-10-22 MED ORDER — INSULIN GLARGINE-LIXISENATIDE 100-33 UNT-MCG/ML ~~LOC~~ SOPN: 30.0000 [IU] | PEN_INJECTOR | Freq: Every day | SUBCUTANEOUS | 0 refills | Status: DC

## 2015-10-22 NOTE — Telephone Encounter (Signed)
Rx sent to his pharmacy

## 2015-10-22 NOTE — Telephone Encounter (Signed)
Called and left a message letting the patient know that he needs to schedule a follow up before his medication can be refilled.

## 2015-10-22 NOTE — Telephone Encounter (Signed)
Patient is scheduled for Friday, please send in his insulin

## 2015-10-22 NOTE — Telephone Encounter (Signed)
Pt called saying he was out of insulin and the pharamacy Had been waiting on [place] since Thursday to get it refilled.

## 2015-10-22 NOTE — Telephone Encounter (Signed)
Pt called stated he is completely out of his [place]. Pt scheduled for 10/25/15. Please send ASAP. Thanks.

## 2015-10-25 ENCOUNTER — Other Ambulatory Visit: Payer: Self-pay | Admitting: Family Medicine

## 2015-10-25 ENCOUNTER — Ambulatory Visit (INDEPENDENT_AMBULATORY_CARE_PROVIDER_SITE_OTHER): Payer: BLUE CROSS/BLUE SHIELD | Admitting: Family Medicine

## 2015-10-25 ENCOUNTER — Encounter: Payer: Self-pay | Admitting: Family Medicine

## 2015-10-25 VITALS — BP 130/87 | HR 81 | Temp 97.6°F | Ht 67.4 in | Wt 160.4 lb

## 2015-10-25 DIAGNOSIS — Z23 Encounter for immunization: Secondary | ICD-10-CM

## 2015-10-25 DIAGNOSIS — I129 Hypertensive chronic kidney disease with stage 1 through stage 4 chronic kidney disease, or unspecified chronic kidney disease: Secondary | ICD-10-CM

## 2015-10-25 DIAGNOSIS — E1121 Type 2 diabetes mellitus with diabetic nephropathy: Secondary | ICD-10-CM

## 2015-10-25 DIAGNOSIS — Z1159 Encounter for screening for other viral diseases: Secondary | ICD-10-CM

## 2015-10-25 DIAGNOSIS — Z794 Long term (current) use of insulin: Secondary | ICD-10-CM

## 2015-10-25 DIAGNOSIS — Z114 Encounter for screening for human immunodeficiency virus [HIV]: Secondary | ICD-10-CM | POA: Diagnosis not present

## 2015-10-25 DIAGNOSIS — E782 Mixed hyperlipidemia: Secondary | ICD-10-CM | POA: Diagnosis not present

## 2015-10-25 DIAGNOSIS — IMO0002 Reserved for concepts with insufficient information to code with codable children: Secondary | ICD-10-CM

## 2015-10-25 DIAGNOSIS — E1165 Type 2 diabetes mellitus with hyperglycemia: Principal | ICD-10-CM

## 2015-10-25 LAB — MICROALBUMIN, URINE WAIVED
CREATININE, URINE WAIVED: 50 mg/dL (ref 10–300)
Microalb, Ur Waived: 80 mg/L — ABNORMAL HIGH (ref 0–19)

## 2015-10-25 LAB — BAYER DCA HB A1C WAIVED

## 2015-10-25 MED ORDER — LISINOPRIL 10 MG PO TABS: 15.0000 mg | ORAL_TABLET | Freq: Every day | ORAL | 1 refills | Status: DC

## 2015-10-25 MED ORDER — INSULIN DEGLUDEC-LIRAGLUTIDE 100-3.6 UNIT-MG/ML ~~LOC~~ SOPN: 16.0000 [IU] | PEN_INJECTOR | Freq: Every day | SUBCUTANEOUS | 3 refills | Status: DC

## 2015-10-25 MED ORDER — METFORMIN HCL 1000 MG PO TABS: 1000.0000 mg | ORAL_TABLET | Freq: Two times a day (BID) | ORAL | 1 refills | Status: DC

## 2015-10-25 MED ORDER — INSULIN PEN NEEDLE 32G X 4 MM MISC: 4 refills | Status: DC

## 2015-10-25 MED ORDER — ROSUVASTATIN CALCIUM 40 MG PO TABS: 40.0000 mg | ORAL_TABLET | Freq: Every day | ORAL | 1 refills | Status: DC

## 2015-10-25 NOTE — Progress Notes (Signed)
BP 130/87 (BP Location: Left Arm, Patient Position: Sitting, Cuff Size: Normal)   Pulse 81   Temp 97.6 F (36.4 C)   Ht 5' 7.4" (1.712 m)   Wt 160 lb 6.4 oz (72.8 kg)   BMI 24.82 kg/m    Subjective:    Patient ID: Arthur [name], male    DOB: 1965/03/02, 50 y.o.   MRN: [id number]  HPI: Arthur [name] is a 50 y.o. male  Chief Complaint  Patient presents with  . Diabetes  . Hyperlipidemia  . Hypertension   SKIN LESION Duration: chronic Location: L upper arm Painful: no Itching: no Onset: gradual Context: not changing Associated signs and symptoms: nothing History of skin cancer: no History of precancerous skin lesions: no Family history of skin cancer: no  DIABETES- doesn't like the soliqua Hypoglycemic episodes:no Polydipsia/polyuria: yes Visual disturbance: no Chest pain: no Paresthesias: no Glucose Monitoring: no Taking Insulin?: yes  Long acting insulin: lantus 25 units BID  Short acting insulin: non Blood Pressure Monitoring: not checking Retinal Examination: Up to Date Foot Exam: Up to Date Diabetic Education: Completed Pneumovax: Up to Date Influenza: Up to Date Aspirin: no  HYPERTENSION / [place] Satisfied with current treatment? yes Duration of hypertension: chronic BP monitoring frequency: not checking BP range:  BP medication side effects: no Past BP meds: lisinopril Duration of hyperlipidemia: chronic Cholesterol medication side effects: no Cholesterol supplements: none Past cholesterol medications: rosuvastatin (crestor) Medication compliance: excellent compliance Aspirin: no Recent stressors: no Recurrent headaches: no Visual changes: no Palpitations: no Dyspnea: no Chest pain: no Lower extremity edema: no Dizzy/lightheaded: no  Relevant past medical, surgical, family and social history reviewed and updated as indicated. Interim medical history since our last visit reviewed. Allergies and medications reviewed and  updated.  Review of Systems  Constitutional: Negative.   Respiratory: Negative.   Cardiovascular: Negative.   Musculoskeletal: Positive for back pain and myalgias. Negative for arthralgias, gait problem, joint swelling, neck pain and neck stiffness.  Skin: Negative.  Negative for color change, pallor, rash and wound.  Psychiatric/Behavioral: Negative.     Per HPI unless specifically indicated above     Objective:    BP 130/87 (BP Location: Left Arm, Patient Position: Sitting, Cuff Size: Normal)   Pulse 81   Temp 97.6 F (36.4 C)   Ht 5' 7.4" (1.712 m)   Wt 160 lb 6.4 oz (72.8 kg)   BMI 24.82 kg/m   Wt Readings from Last 3 Encounters:  10/25/15 160 lb 6.4 oz (72.8 kg)  07/31/15 162 lb (73.5 kg)  04/22/15 172 lb (78 kg)    Physical Exam  Constitutional: He is oriented to person, place, and time. He appears well-developed and well-nourished. No distress.  HENT:  Head: Normocephalic and atraumatic.  Right Ear: Hearing normal.  Left Ear: Hearing normal.  Nose: Nose normal.  Eyes: Conjunctivae and lids are normal. Right eye exhibits no discharge. Left eye exhibits no discharge. No scleral icterus.  Cardiovascular: Normal rate, regular rhythm, normal heart sounds and intact distal pulses.  Exam reveals no gallop and no friction rub.   No murmur heard. Pulmonary/Chest: Effort normal and breath sounds normal. No respiratory distress. He has no wheezes. He has no rales. He exhibits no tenderness.  Musculoskeletal: Normal range of motion.  Neurological: He is alert and oriented to person, place, and time.  Skin: Skin is warm, dry and intact. No rash noted. No erythema. No pallor.  1cm hyperpigmented round lesion on L upper arm  Psychiatric: He has a normal mood and affect. His speech is normal and behavior is normal. Judgment and thought content normal. Cognition and memory are normal.  Nursing note and vitals reviewed.   Results for orders placed or performed in visit on  10/25/15  HM DIABETES EYE EXAM  Result Value Ref Range   HM Diabetic Eye Exam Retinopathy (A) No Retinopathy      Assessment & Plan:   Problem List Items Addressed This Visit      Endocrine   Type II diabetes mellitus with renal manifestations, uncontrolled ([place]) - Primary    Not under good control. A1c came back at >14. Does not like soliqua. Will change him to xultophy. Sample given today. Recheck 1 month.       Relevant Medications   Insulin Degludec-Liraglutide (XULTOPHY) 100-3.6 UNIT-MG/ML SOPN   metFORMIN (GLUCOPHAGE) 1000 MG tablet   lisinopril (PRINIVIL,ZESTRIL) 10 MG tablet   rosuvastatin (CRESTOR) 40 MG tablet     Genitourinary   Benign hypertensive renal disease    Under good control. Continue current regimen. Continue to monitor. Recheck 6 months.         Other   Hyperlipidemia    Under good control. Continue current regimen. Continue to monitor. Recheck 6 months.       Relevant Medications   lisinopril (PRINIVIL,ZESTRIL) 10 MG tablet   rosuvastatin (CRESTOR) 40 MG tablet    Other Visit Diagnoses    Need for hepatitis C screening test       Labs drawn today, await results.    Relevant Orders   Hepatitis C antibody   Screening for HIV (human immunodeficiency virus)       Labs drawn today, await results.    Relevant Orders   HIV antibody   Immunization due       Flu shot given today.   Relevant Orders   Flu Vaccine QUAD 36+ mos PF IM (Fluarix & Fluzone Quad PF)       Follow up plan: Return in about 4 weeks (around 11/22/2015) for Follow up sugars.

## 2015-10-25 NOTE — Assessment & Plan Note (Signed)
Not under good control. A1c came back at >14. Does not like soliqua. Will change him to xultophy. Sample given today. Recheck 1 month.

## 2015-10-25 NOTE — Assessment & Plan Note (Signed)
Under good control. Continue current regimen. Continue to monitor. Recheck 6 months.  

## 2015-10-26 LAB — COMPREHENSIVE METABOLIC PANEL
ALBUMIN: 4.1 g/dL (ref 3.5–5.5)
ALK PHOS: 86 IU/L (ref 39–117)
ALT: 20 IU/L (ref 0–44)
AST: 11 IU/L (ref 0–40)
Albumin/Globulin Ratio: 1.9 (ref 1.2–2.2)
BUN / CREAT RATIO: 12 (ref 9–20)
BUN: 14 mg/dL (ref 6–24)
Bilirubin Total: 0.3 mg/dL (ref 0.0–1.2)
CO2: 22 mmol/L (ref 18–29)
CREATININE: 1.17 mg/dL (ref 0.76–1.27)
Calcium: 9.8 mg/dL (ref 8.7–10.2)
Chloride: 96 mmol/L (ref 96–106)
GFR calc Af Amer: 82 mL/min/{1.73_m2} (ref >59)
GFR calc non Af Amer: 71 mL/min/{1.73_m2} (ref >59)
GLOBULIN, TOTAL: 2.2 g/dL (ref 1.5–4.5)
Glucose: 363 mg/dL — ABNORMAL HIGH (ref 65–99)
Potassium: 4.6 mmol/L (ref 3.5–5.2)
SODIUM: 135 mmol/L (ref 134–144)
Total Protein: 6.3 g/dL (ref 6.0–8.5)

## 2015-10-26 LAB — HIV ANTIBODY (ROUTINE TESTING W REFLEX): HIV Screen 4th Generation wRfx: NONREACTIVE

## 2015-10-26 LAB — HEPATITIS C ANTIBODY: HEP C VIRUS AB: 0.1 {s_co_ratio} (ref 0.0–0.9)

## 2015-10-28 ENCOUNTER — Other Ambulatory Visit: Payer: Self-pay | Admitting: Family Medicine

## 2015-10-28 ENCOUNTER — Encounter: Payer: Self-pay | Admitting: Family Medicine

## 2015-10-28 MED ORDER — INSULIN DEGLUDEC-LIRAGLUTIDE 100-3.6 UNIT-MG/ML ~~LOC~~ SOPN: 16.0000 [IU] | PEN_INJECTOR | Freq: Every day | SUBCUTANEOUS | 3 refills | Status: DC

## 2015-11-07 ENCOUNTER — Other Ambulatory Visit: Payer: Self-pay | Admitting: Family Medicine

## 2015-11-25 ENCOUNTER — Other Ambulatory Visit: Payer: Self-pay | Admitting: Family Medicine

## 2015-11-25 ENCOUNTER — Ambulatory Visit: Payer: BLUE CROSS/BLUE SHIELD | Admitting: Family Medicine

## 2015-11-25 DIAGNOSIS — E1122 Type 2 diabetes mellitus with diabetic chronic kidney disease: Secondary | ICD-10-CM

## 2015-11-25 DIAGNOSIS — E1165 Type 2 diabetes mellitus with hyperglycemia: Principal | ICD-10-CM

## 2015-11-25 DIAGNOSIS — IMO0002 Reserved for concepts with insufficient information to code with codable children: Secondary | ICD-10-CM

## 2015-11-25 DIAGNOSIS — N182 Chronic kidney disease, stage 2 (mild): Principal | ICD-10-CM

## 2015-11-25 DIAGNOSIS — Z794 Long term (current) use of insulin: Principal | ICD-10-CM

## 2015-12-12 ENCOUNTER — Ambulatory Visit (INDEPENDENT_AMBULATORY_CARE_PROVIDER_SITE_OTHER): Payer: BLUE CROSS/BLUE SHIELD | Admitting: Family Medicine

## 2015-12-12 ENCOUNTER — Encounter: Payer: Self-pay | Admitting: Family Medicine

## 2015-12-12 VITALS — BP 123/84 | HR 88 | Temp 97.8°F | Wt 157.4 lb

## 2015-12-12 DIAGNOSIS — E1121 Type 2 diabetes mellitus with diabetic nephropathy: Secondary | ICD-10-CM

## 2015-12-12 DIAGNOSIS — E1165 Type 2 diabetes mellitus with hyperglycemia: Secondary | ICD-10-CM | POA: Diagnosis not present

## 2015-12-12 DIAGNOSIS — IMO0002 Reserved for concepts with insufficient information to code with codable children: Secondary | ICD-10-CM

## 2015-12-12 DIAGNOSIS — Z794 Long term (current) use of insulin: Secondary | ICD-10-CM | POA: Diagnosis not present

## 2015-12-12 NOTE — Patient Instructions (Addendum)

## 2015-12-12 NOTE — Assessment & Plan Note (Signed)
Doing better. Will increase to 34 units at bedtime and recheck in 2 months. Call with any concerns.

## 2015-12-12 NOTE — Progress Notes (Signed)
BP 123/84 (BP Location: Left Arm, Patient Position: Sitting, Cuff Size: Normal)   Pulse 88   Temp 97.8 F (36.6 C)   Wt 157 lb 6.4 oz (71.4 kg)   SpO2 100%   BMI 24.36 kg/m    Subjective:    Patient ID: Arthur [name], male    DOB: [birth year], 50 y.o.   MRN: [id number]  HPI: Arthur [name] is a 50 y.o. male  Chief Complaint  Patient presents with  . Diabetes   DIABETES Hypoglycemic episodes:no Polydipsia/polyuria: no Visual disturbance: no Chest pain: no Paresthesias: no Glucose Monitoring: yes  Accucheck frequency: 2x Daily  Fasting glucose: 160  Post prandial: 180 Taking Insulin?: yes  Long acting insulin: 30 units at bedtime Blood Pressure Monitoring: not checking Retinal Examination: Up to Date Foot Exam: Up to Date Diabetic Education: Completed Pneumovax: Up to Date Influenza: Up to Date Aspirin: no  Relevant past medical, surgical, family and social history reviewed and updated as indicated. Interim medical history since our last visit reviewed. Allergies and medications reviewed and updated.  Review of Systems  Constitutional: Negative.   Respiratory: Negative.   Cardiovascular: Negative.   Psychiatric/Behavioral: Negative.     Per HPI unless specifically indicated above     Objective:    BP 123/84 (BP Location: Left Arm, Patient Position: Sitting, Cuff Size: Normal)   Pulse 88   Temp 97.8 F (36.6 C)   Wt 157 lb 6.4 oz (71.4 kg)   SpO2 100%   BMI 24.36 kg/m   Wt Readings from Last 3 Encounters:  12/12/15 157 lb 6.4 oz (71.4 kg)  10/25/15 160 lb 6.4 oz (72.8 kg)  07/31/15 162 lb (73.5 kg)    Physical Exam  Constitutional: He is oriented to person, place, and time. He appears well-developed and well-nourished. No distress.  HENT:  Head: Normocephalic and atraumatic.  Right Ear: Hearing normal.  Left Ear: Hearing normal.  Nose: Nose normal.  Eyes: Conjunctivae and lids are normal. Right eye exhibits no discharge. Left eye exhibits  no discharge. No scleral icterus.  Cardiovascular: Normal rate, regular rhythm, normal heart sounds and intact distal pulses.  Exam reveals no gallop and no friction rub.   No murmur heard. Pulmonary/Chest: Effort normal and breath sounds normal. No respiratory distress. He has no wheezes. He has no rales. He exhibits no tenderness.  Musculoskeletal: Normal range of motion.  Neurological: He is alert and oriented to person, place, and time.  Skin: Skin is warm, dry and intact. No rash noted. No erythema. No pallor.  Psychiatric: He has a normal mood and affect. His speech is normal and behavior is normal. Judgment and thought content normal. Cognition and memory are normal.  Vitals reviewed.   Results for orders placed or performed in visit on 10/25/15  Bayer DCA Hb A1c Waived  Result Value Ref Range   Bayer DCA Hb A1c Waived >14.0 (H) <7.0 %  Comprehensive metabolic panel  Result Value Ref Range   Glucose 363 (H) 65 - 99 mg/dL   BUN 14 6 - 24 mg/dL   Creatinine, Ser 1.17 0.76 - 1.27 mg/dL   GFR calc non Af Amer 71 >59 mL/min/1.73   GFR calc Af Amer 82 >59 mL/min/1.73   BUN/Creatinine Ratio 12 9 - 20   Sodium 135 134 - 144 mmol/L   Potassium 4.6 3.5 - 5.2 mmol/L   Chloride 96 96 - 106 mmol/L   CO2 22 18 - 29 mmol/L   Calcium 9.8  8.7 - 10.2 mg/dL   Total Protein 6.3 6.0 - 8.5 g/dL   Albumin 4.1 3.5 - 5.5 g/dL   Globulin, Total 2.2 1.5 - 4.5 g/dL   Albumin/Globulin Ratio 1.9 1.2 - 2.2   Bilirubin Total 0.3 0.0 - 1.2 mg/dL   Alkaline Phosphatase 86 39 - 117 IU/L   AST 11 0 - 40 IU/L   ALT 20 0 - 44 IU/L  Microalbumin, Urine Waived  Result Value Ref Range   Microalb, Ur Waived 80 (H) 0 - 19 mg/L   Creatinine, Urine Waived 50 10 - 300 mg/dL   Microalb/Creat Ratio >300 (H) <30 mg/g  Hepatitis C antibody  Result Value Ref Range   Hep C Virus Ab 0.1 0.0 - 0.9 s/co ratio  HIV antibody  Result Value Ref Range   HIV Screen 4th Generation wRfx Non Reactive Non Reactive  HM DIABETES  EYE EXAM  Result Value Ref Range   HM Diabetic Eye Exam Retinopathy (A) No Retinopathy      Assessment & Plan:   Problem List Items Addressed This Visit      Endocrine   Type II diabetes mellitus with renal manifestations, uncontrolled ([place]) - Primary    Doing better. Will increase to 34 units at bedtime and recheck in 2 months. Call with any concerns.           Follow up plan: Return in about 2 months (around 02/12/2016) for DM visit.

## 2016-02-13 ENCOUNTER — Ambulatory Visit: Payer: BLUE CROSS/BLUE SHIELD | Admitting: Family Medicine

## 2016-03-24 DIAGNOSIS — S0502XA Injury of conjunctiva and corneal abrasion without foreign body, left eye, initial encounter: Secondary | ICD-10-CM | POA: Diagnosis not present

## 2016-04-30 ENCOUNTER — Other Ambulatory Visit: Payer: Self-pay | Admitting: Family Medicine

## 2016-05-05 ENCOUNTER — Other Ambulatory Visit: Payer: Self-pay | Admitting: Family Medicine

## 2016-05-05 NOTE — Telephone Encounter (Signed)
Routing to provider. No follow up on file. 

## 2016-05-23 ENCOUNTER — Other Ambulatory Visit: Payer: Self-pay | Admitting: Family Medicine

## 2016-06-29 ENCOUNTER — Telehealth: Payer: Self-pay

## 2016-06-29 NOTE — Telephone Encounter (Signed)
Received a fax from [facility] requesting a refill on the patients Soliqua. Called and left a voicemail for patient letting him know that he needs to call and schedule an visit, and that once it is scheduled we will send in enough medication to get him to his visit.

## 2016-06-30 MED ORDER — INSULIN DEGLUDEC-LIRAGLUTIDE 100-3.6 UNIT-MG/ML ~~LOC~~ SOPN: 16.0000 [IU] | PEN_INJECTOR | Freq: Every day | SUBCUTANEOUS | 0 refills | Status: DC

## 2016-06-30 NOTE — Telephone Encounter (Signed)
He's not on soliqua, he's on xultafy- but I can send that through for him

## 2016-06-30 NOTE — Telephone Encounter (Signed)
Called patient, no answer, left message for patient to return my call.  

## 2016-06-30 NOTE — Telephone Encounter (Signed)
Patient returned call  And scheduled an appointment for 07/30/2016.  Patient asks if he can get his medication for soliqua due to being out of medication.   Please Advise.  Thank you

## 2016-07-30 ENCOUNTER — Ambulatory Visit: Payer: BLUE CROSS/BLUE SHIELD | Admitting: Family Medicine

## 2016-08-03 ENCOUNTER — Other Ambulatory Visit: Payer: Self-pay | Admitting: Family Medicine

## 2016-08-10 ENCOUNTER — Ambulatory Visit (INDEPENDENT_AMBULATORY_CARE_PROVIDER_SITE_OTHER): Payer: BLUE CROSS/BLUE SHIELD | Admitting: Family Medicine

## 2016-08-10 ENCOUNTER — Encounter: Payer: Self-pay | Admitting: Family Medicine

## 2016-08-10 VITALS — BP 118/76 | HR 78 | Temp 98.5°F | Wt 156.0 lb

## 2016-08-10 DIAGNOSIS — I129 Hypertensive chronic kidney disease with stage 1 through stage 4 chronic kidney disease, or unspecified chronic kidney disease: Secondary | ICD-10-CM

## 2016-08-10 DIAGNOSIS — N400 Enlarged prostate without lower urinary tract symptoms: Secondary | ICD-10-CM

## 2016-08-10 DIAGNOSIS — Z794 Long term (current) use of insulin: Secondary | ICD-10-CM

## 2016-08-10 DIAGNOSIS — E1165 Type 2 diabetes mellitus with hyperglycemia: Secondary | ICD-10-CM | POA: Diagnosis not present

## 2016-08-10 DIAGNOSIS — E1121 Type 2 diabetes mellitus with diabetic nephropathy: Secondary | ICD-10-CM

## 2016-08-10 DIAGNOSIS — E782 Mixed hyperlipidemia: Secondary | ICD-10-CM

## 2016-08-10 DIAGNOSIS — IMO0002 Reserved for concepts with insufficient information to code with codable children: Secondary | ICD-10-CM

## 2016-08-10 DIAGNOSIS — E1169 Type 2 diabetes mellitus with other specified complication: Secondary | ICD-10-CM | POA: Diagnosis not present

## 2016-08-10 DIAGNOSIS — N521 Erectile dysfunction due to diseases classified elsewhere: Secondary | ICD-10-CM | POA: Diagnosis not present

## 2016-08-10 LAB — HEMOGLOBIN A1C: HEMOGLOBIN A1C: 9.7

## 2016-08-10 MED ORDER — INSULIN DEGLUDEC-LIRAGLUTIDE 100-3.6 UNIT-MG/ML ~~LOC~~ SOPN: 45.0000 [IU] | PEN_INJECTOR | Freq: Every day | SUBCUTANEOUS | 0 refills | Status: DC

## 2016-08-10 MED ORDER — INSULIN PEN NEEDLE 32G X 4 MM MISC: 4 refills | Status: DC

## 2016-08-10 MED ORDER — METFORMIN HCL 1000 MG PO TABS: ORAL_TABLET | ORAL | 1 refills | Status: DC

## 2016-08-10 MED ORDER — LISINOPRIL 10 MG PO TABS: 15.0000 mg | ORAL_TABLET | Freq: Every day | ORAL | 1 refills | Status: DC

## 2016-08-10 MED ORDER — ROSUVASTATIN CALCIUM 40 MG PO TABS: 40.0000 mg | ORAL_TABLET | Freq: Every day | ORAL | 1 refills | Status: DC

## 2016-08-10 MED ORDER — FLUTICASONE PROPIONATE 50 MCG/ACT NA SUSP: 2.0000 | Freq: Every day | NASAL | 3 refills | Status: DC

## 2016-08-10 MED ORDER — GLUCOSE BLOOD VI STRP: 1.0000 | ORAL_STRIP | 12 refills | Status: DC | PRN

## 2016-08-10 NOTE — Assessment & Plan Note (Signed)
Rechecking levels today. Await results. Call with any concerns.  

## 2016-08-10 NOTE — Assessment & Plan Note (Signed)
Under good control. Continue current regimen. Continue to monitor. Call with any concerns. 

## 2016-08-10 NOTE — Assessment & Plan Note (Signed)
Rechecking PSA today. Await results.  

## 2016-08-10 NOTE — Progress Notes (Signed)
BP 118/76 (BP Location: Left Arm, Patient Position: Sitting, Cuff Size: Normal)   Pulse 78   Temp 98.5 F (36.9 C)   Wt 156 lb (70.8 kg)   SpO2 100%   BMI 24.14 kg/m    Subjective:    Patient ID: Arthur [name], male    DOB: 1965/05/30, 51 y.o.   MRN: [id number]  HPI: Arthur [name] is a 51 y.o. male  Chief Complaint  Patient presents with  . Diabetes   HYPERTENSION / HYPERLIPIDEMIA Satisfied with current treatment? yes Duration of hypertension: chronic BP monitoring frequency: not checking BP medication side effects: no Past BP meds: lisinopril Duration of hyperlipidemia: chronic Cholesterol medication side effects: no Cholesterol supplements: none Past cholesterol medications: rosuvastatin (crestor) Medication compliance: excellent compliance Aspirin: no Recent stressors: yes Recurrent headaches: no Visual changes: no Palpitations: no Dyspnea: no Chest pain: no Lower extremity edema: no Dizzy/lightheaded: no  DIABETES- has been using his xultophy since June Hypoglycemic episodes:no Polydipsia/polyuria: no Visual disturbance: no Chest pain: no Paresthesias: no Glucose Monitoring: no  Accucheck frequency: Daily  Fasting glucose: 200 at the highest Taking Insulin?: yes- 40 units qHS Blood Pressure Monitoring: not checking Retinal Examination: Up to Date Foot Exam: Up to Date Diabetic Education: Completed Pneumovax: Up to Date Influenza: Up to Date Aspirin: yes  Relevant past medical, surgical, family and social history reviewed and updated as indicated. Interim medical history since our last visit reviewed. Allergies and medications reviewed and updated.  Review of Systems  Constitutional: Negative.   Respiratory: Negative.   Cardiovascular: Negative.   Genitourinary: Negative for decreased urine volume, difficulty urinating, discharge, dysuria, enuresis, flank pain, frequency, genital sores, hematuria, penile pain, penile swelling, scrotal  swelling, testicular pain and urgency.  Psychiatric/Behavioral: Negative.     Per HPI unless specifically indicated above     Objective:    BP 118/76 (BP Location: Left Arm, Patient Position: Sitting, Cuff Size: Normal)   Pulse 78   Temp 98.5 F (36.9 C)   Wt 156 lb (70.8 kg)   SpO2 100%   BMI 24.14 kg/m   Wt Readings from Last 3 Encounters:  08/10/16 156 lb (70.8 kg)  12/12/15 157 lb 6.4 oz (71.4 kg)  10/25/15 160 lb 6.4 oz (72.8 kg)    Physical Exam  Constitutional: He is oriented to person, place, and time. He appears well-developed and well-nourished. No distress.  HENT:  Head: Normocephalic and atraumatic.  Right Ear: Hearing normal.  Left Ear: Hearing normal.  Nose: Nose normal.  Eyes: Conjunctivae and lids are normal. Right eye exhibits no discharge. Left eye exhibits no discharge. No scleral icterus.  Cardiovascular: Normal rate, regular rhythm, normal heart sounds and intact distal pulses.  Exam reveals no gallop and no friction rub.   No murmur heard. Pulmonary/Chest: Effort normal and breath sounds normal. No respiratory distress. He has no wheezes. He has no rales. He exhibits no tenderness.  Musculoskeletal: Normal range of motion.  Neurological: He is alert and oriented to person, place, and time.  Skin: Skin is warm, dry and intact. No rash noted. He is not diaphoretic. No erythema. No pallor.  Psychiatric: He has a normal mood and affect. His speech is normal and behavior is normal. Judgment and thought content normal. Cognition and memory are normal.  Nursing note and vitals reviewed.   Results for orders placed or performed in visit on 08/10/16  Hemoglobin A1c  Result Value Ref Range   Hemoglobin A1C 9.7  Assessment & Plan:   Problem List Items Addressed This Visit      Endocrine   Type II diabetes mellitus with renal manifestations, uncontrolled ([place]) - Primary    Improved with A1c of 9.7 today. Will increase xultophy to 45 units QHS and  recheck in 3 months.       Relevant Medications   Insulin Degludec-Liraglutide (XULTOPHY) 100-3.6 UNIT-MG/ML SOPN   rosuvastatin (CRESTOR) 40 MG tablet   metFORMIN (GLUCOPHAGE) 1000 MG tablet   lisinopril (PRINIVIL,ZESTRIL) 10 MG tablet   Other Relevant Orders   Bayer DCA Hb A1c Waived   Comprehensive metabolic panel   Microalbumin, Urine Waived   Erectile dysfunction associated with type 2 diabetes mellitus ([place])    Worse since the accident. Would like to see urology- referral generated today.      Relevant Medications   Insulin Degludec-Liraglutide (XULTOPHY) 100-3.6 UNIT-MG/ML SOPN   rosuvastatin (CRESTOR) 40 MG tablet   metFORMIN (GLUCOPHAGE) 1000 MG tablet   lisinopril (PRINIVIL,ZESTRIL) 10 MG tablet     Genitourinary   Benign hypertensive renal disease    Under good control. Continue current regimen. Continue to monitor. Call with any concerns.       Relevant Orders   Comprehensive metabolic panel   Microalbumin, Urine Waived   Benign prostatic hyperplasia    Rechecking PSA today. Await results.       Relevant Orders   Comprehensive metabolic panel   PSA     Other   Hyperlipidemia    Rechecking levels today. Await results. Call with any concerns.      Relevant Medications   rosuvastatin (CRESTOR) 40 MG tablet   lisinopril (PRINIVIL,ZESTRIL) 10 MG tablet   Other Relevant Orders   Comprehensive metabolic panel   Lipid Panel w/o Chol/HDL Ratio       Follow up plan: Return in about 3 months (around 11/10/2016) for Physical .

## 2016-08-10 NOTE — Assessment & Plan Note (Signed)
Worse since the accident. Would like to see urology- referral generated today.

## 2016-08-10 NOTE — Assessment & Plan Note (Signed)
Improved with A1c of 9.7 today. Will increase xultophy to 45 units QHS and recheck in 3 months.

## 2016-08-11 LAB — MICROALBUMIN, URINE WAIVED
CREATININE, URINE WAIVED: 100 mg/dL (ref 10–300)
Microalb, Ur Waived: 150 mg/L — ABNORMAL HIGH (ref 0–19)
Microalb/Creat Ratio: >300 mg/g — ABNORMAL HIGH (ref <30)

## 2016-08-11 LAB — COMPREHENSIVE METABOLIC PANEL
ALT: 19 IU/L (ref 0–44)
AST: 20 IU/L (ref 0–40)
Albumin/Globulin Ratio: 2.1 (ref 1.2–2.2)
Albumin: 4.7 g/dL (ref 3.5–5.5)
Alkaline Phosphatase: 71 IU/L (ref 39–117)
BILIRUBIN TOTAL: 0.2 mg/dL (ref 0.0–1.2)
BUN/Creatinine Ratio: 8 — ABNORMAL LOW (ref 9–20)
BUN: 10 mg/dL (ref 6–24)
CALCIUM: 9.9 mg/dL (ref 8.7–10.2)
CHLORIDE: 104 mmol/L (ref 96–106)
CO2: 24 mmol/L (ref 20–29)
Creatinine, Ser: 1.3 mg/dL — ABNORMAL HIGH (ref 0.76–1.27)
GFR, EST AFRICAN AMERICAN: 72 mL/min/{1.73_m2} (ref >59)
GFR, EST NON AFRICAN AMERICAN: 62 mL/min/{1.73_m2} (ref >59)
GLUCOSE: 104 mg/dL — AB (ref 65–99)
Globulin, Total: 2.2 g/dL (ref 1.5–4.5)
Potassium: 4.9 mmol/L (ref 3.5–5.2)
Sodium: 141 mmol/L (ref 134–144)
TOTAL PROTEIN: 6.9 g/dL (ref 6.0–8.5)

## 2016-08-11 LAB — PSA: Prostate Specific Ag, Serum: 0.8 ng/mL (ref 0.0–4.0)

## 2016-08-11 LAB — LIPID PANEL W/O CHOL/HDL RATIO
Cholesterol, Total: 103 mg/dL (ref 100–199)
HDL: 37 mg/dL — AB (ref >39)
LDL Calculated: 47 mg/dL (ref 0–99)
TRIGLYCERIDES: 95 mg/dL (ref 0–149)
VLDL CHOLESTEROL CAL: 19 mg/dL (ref 5–40)

## 2016-08-11 LAB — BAYER DCA HB A1C WAIVED: HB A1C: 9.7 % — AB (ref <7.0)

## 2016-10-24 ENCOUNTER — Other Ambulatory Visit: Payer: Self-pay | Admitting: Family Medicine

## 2016-11-10 ENCOUNTER — Encounter: Payer: BLUE CROSS/BLUE SHIELD | Admitting: Family Medicine

## 2017-03-11 ENCOUNTER — Other Ambulatory Visit: Payer: Self-pay | Admitting: Family Medicine

## 2017-03-11 NOTE — Telephone Encounter (Signed)
LOV 08/10/16 with [facility]  No show for 11/10/16  [facility] [place], [place]

## 2017-03-11 NOTE — Telephone Encounter (Signed)
Needs a follow up appointment to get any further refills.

## 2017-03-11 NOTE — Telephone Encounter (Signed)
Copied from [place] [id number]. Topic: Quick Communication - Rx Refill/Question >> Mar 11, 2017  1:38 PM Synthia Innocent wrote: Medication: XULTOPHY 100-3.6 UNIT-MG/ML SOPN, rosuvastatin (CRESTOR) 40 MG tablet, lisinopril (PRINIVIL,ZESTRIL) 10 MG tablet    Has the patient contacted their pharmacy? Yes.     (Agent: If no, request that the patient contact the pharmacy for the refill.)   Preferred Pharmacy (with phone number or street name): [facility]   Agent: Please be advised that RX refills may take up to 3 business days. We ask that you follow-up with your pharmacy.

## 2017-03-12 MED ORDER — ROSUVASTATIN CALCIUM 40 MG PO TABS: 40.0000 mg | ORAL_TABLET | Freq: Every day | ORAL | 1 refills | Status: DC

## 2017-03-12 MED ORDER — INSULIN DEGLUDEC-LIRAGLUTIDE 100-3.6 UNIT-MG/ML ~~LOC~~ SOPN: 45.0000 [IU] | PEN_INJECTOR | Freq: Every day | SUBCUTANEOUS | 1 refills | Status: DC

## 2017-03-12 MED ORDER — LISINOPRIL 10 MG PO TABS: 15.0000 mg | ORAL_TABLET | Freq: Every day | ORAL | 1 refills | Status: DC

## 2017-03-15 NOTE — Telephone Encounter (Signed)
Called patient and left a message in vm to call back office to set up an appointment.

## 2017-03-23 ENCOUNTER — Encounter: Payer: Self-pay | Admitting: Family Medicine

## 2017-04-16 ENCOUNTER — Encounter: Payer: Self-pay | Admitting: Family Medicine

## 2017-04-16 ENCOUNTER — Ambulatory Visit: Payer: BLUE CROSS/BLUE SHIELD | Admitting: Family Medicine

## 2017-04-16 VITALS — BP 126/83 | HR 92 | Temp 98.4°F | Ht 67.0 in | Wt 158.7 lb

## 2017-04-16 DIAGNOSIS — E1165 Type 2 diabetes mellitus with hyperglycemia: Secondary | ICD-10-CM

## 2017-04-16 DIAGNOSIS — N521 Erectile dysfunction due to diseases classified elsewhere: Secondary | ICD-10-CM | POA: Diagnosis not present

## 2017-04-16 DIAGNOSIS — E1129 Type 2 diabetes mellitus with other diabetic kidney complication: Secondary | ICD-10-CM

## 2017-04-16 DIAGNOSIS — E1169 Type 2 diabetes mellitus with other specified complication: Secondary | ICD-10-CM | POA: Diagnosis not present

## 2017-04-16 DIAGNOSIS — I129 Hypertensive chronic kidney disease with stage 1 through stage 4 chronic kidney disease, or unspecified chronic kidney disease: Secondary | ICD-10-CM | POA: Diagnosis not present

## 2017-04-16 DIAGNOSIS — S62525A Nondisplaced fracture of distal phalanx of left thumb, initial encounter for closed fracture: Secondary | ICD-10-CM | POA: Diagnosis not present

## 2017-04-16 DIAGNOSIS — S62522A Displaced fracture of distal phalanx of left thumb, initial encounter for closed fracture: Secondary | ICD-10-CM | POA: Diagnosis not present

## 2017-04-16 DIAGNOSIS — M79644 Pain in right finger(s): Secondary | ICD-10-CM | POA: Diagnosis not present

## 2017-04-16 DIAGNOSIS — E782 Mixed hyperlipidemia: Secondary | ICD-10-CM

## 2017-04-16 DIAGNOSIS — N400 Enlarged prostate without lower urinary tract symptoms: Secondary | ICD-10-CM

## 2017-04-16 DIAGNOSIS — S6992XA Unspecified injury of left wrist, hand and finger(s), initial encounter: Secondary | ICD-10-CM | POA: Diagnosis not present

## 2017-04-16 DIAGNOSIS — IMO0002 Reserved for concepts with insufficient information to code with codable children: Secondary | ICD-10-CM

## 2017-04-16 LAB — UA/M W/RFLX CULTURE, ROUTINE
Bilirubin, UA: NEGATIVE
Glucose, UA: NEGATIVE
Ketones, UA: NEGATIVE
LEUKOCYTES UA: NEGATIVE
Nitrite, UA: NEGATIVE
PH UA: 5.5 (ref 5.0–7.5)
Specific Gravity, UA: 1.015 (ref 1.005–1.030)
Urobilinogen, Ur: 0.2 mg/dL (ref 0.2–1.0)

## 2017-04-16 LAB — MICROSCOPIC EXAMINATION: Bacteria, UA: NONE SEEN

## 2017-04-16 LAB — BAYER DCA HB A1C WAIVED: HB A1C (BAYER DCA - WAIVED): 8.6 % — ABNORMAL HIGH (ref <7.0)

## 2017-04-16 MED ORDER — TRAMADOL HCL 50 MG PO TABS: 50.0000 mg | ORAL_TABLET | Freq: Three times a day (TID) | ORAL | 0 refills | Status: DC | PRN

## 2017-04-16 MED ORDER — ROSUVASTATIN CALCIUM 40 MG PO TABS: 40.0000 mg | ORAL_TABLET | Freq: Every day | ORAL | 1 refills | Status: DC

## 2017-04-16 MED ORDER — INSULIN PEN NEEDLE 32G X 4 MM MISC: 4 refills | Status: DC

## 2017-04-16 MED ORDER — LISINOPRIL 10 MG PO TABS: 15.0000 mg | ORAL_TABLET | Freq: Every day | ORAL | 1 refills | Status: DC

## 2017-04-16 MED ORDER — FLUTICASONE PROPIONATE 50 MCG/ACT NA SUSP: 2.0000 | Freq: Every day | NASAL | 3 refills | Status: DC

## 2017-04-16 MED ORDER — METFORMIN HCL 1000 MG PO TABS: ORAL_TABLET | ORAL | 1 refills | Status: DC

## 2017-04-16 MED ORDER — NAPROXEN 500 MG PO TABS: 500.0000 mg | ORAL_TABLET | Freq: Two times a day (BID) | ORAL | 0 refills | Status: DC

## 2017-04-16 MED ORDER — GLUCOSE BLOOD VI STRP: 1.0000 | ORAL_STRIP | 12 refills | Status: DC | PRN

## 2017-04-16 MED ORDER — INSULIN DEGLUDEC-LIRAGLUTIDE 100-3.6 UNIT-MG/ML ~~LOC~~ SOPN: 50.0000 [IU] | PEN_INJECTOR | Freq: Every day | SUBCUTANEOUS | 1 refills | Status: DC

## 2017-04-16 NOTE — Assessment & Plan Note (Signed)
Better, with A1c of 8.6- will increase xultophy to 50units and recheck 3 months. Call with any concerns.

## 2017-04-16 NOTE — Assessment & Plan Note (Signed)
Under good control on current regimen. Continue to monitor. Call with any concerns.  

## 2017-04-16 NOTE — Assessment & Plan Note (Signed)
Checking PSA today. Await results.

## 2017-04-16 NOTE — Progress Notes (Signed)
BP 126/83   Pulse 92   Temp 98.4 F (36.9 C) (Oral)   Ht 5\' 7"  (1.702 m)   Wt 158 lb 11.2 oz (72 kg)   SpO2 99%   BMI 24.86 kg/m    Subjective:    Patient ID: Arthur [name], male    DOB: 17-Feb-1965, 52 y.o.   MRN: [id number]  HPI: Arthur [name] is a 52 y.o. male  Chief Complaint  Patient presents with  . thumb pain    Left thumb, pt states he smashed his thumb with a hammer about 2 to 3 days ago  . Diabetes  . Hyperlipidemia  . Hypertension   Thumb pain- tried to put an anchor bolt into the wall and hit his thumb about 2-3 days ago. Has been throbbing. Hard to move it. In a lot of pain. Has taken some advil without relief. No numbness or tingling.   HYPERTENSION / HYPERLIPIDEMIA Satisfied with current treatment? yes Duration of hypertension: chronic BP monitoring frequency: not checking BP range:  BP medication side effects: no Past BP meds:  Lisinopril Duration of hyperlipidemia: chronic Cholesterol medication side effects: no Cholesterol supplements: none Past cholesterol medications: crestor Medication compliance: excellent compliance Aspirin: no Recent stressors: no Recurrent headaches: no Visual changes: no Palpitations: no Dyspnea: no Chest pain: no Lower extremity edema: no Dizzy/lightheaded: no  DIABETES Hypoglycemic episodes:no Polydipsia/polyuria: no Visual disturbance: no Chest pain: no Paresthesias: no Glucose Monitoring: yes  Accucheck frequency: BID  Fasting glucose: 100s-120s Taking Insulin?: yes Blood Pressure Monitoring: not checking Retinal Examination: Not up to Date Foot Exam: Up to Date Diabetic Education: Completed Pneumovax: Up to Date Influenza: Up to Date Aspirin: no   Relevant past medical, surgical, family and social history reviewed and updated as indicated. Interim medical history since our last visit reviewed. Allergies and medications reviewed and updated.  Review of Systems  Constitutional: Negative.     Respiratory: Negative.   Cardiovascular: Negative.   Musculoskeletal: Positive for arthralgias. Negative for back pain, gait problem, joint swelling, myalgias, neck pain and neck stiffness.  Skin: Positive for color change. Negative for pallor, rash and wound.  Neurological: Negative.   Psychiatric/Behavioral: Negative.     Per HPI unless specifically indicated above     Objective:    BP 126/83   Pulse 92   Temp 98.4 F (36.9 C) (Oral)   Ht 5\' 7"  (1.702 m)   Wt 158 lb 11.2 oz (72 kg)   SpO2 99%   BMI 24.86 kg/m   Wt Readings from Last 3 Encounters:  04/16/17 158 lb 11.2 oz (72 kg)  08/10/16 156 lb (70.8 kg)  12/12/15 157 lb 6.4 oz (71.4 kg)    Physical Exam  Constitutional: He is oriented to person, place, and time. He appears well-developed and well-nourished. No distress.  HENT:  Head: Normocephalic and atraumatic.  Right Ear: Hearing normal.  Left Ear: Hearing normal.  Nose: Nose normal.  Eyes: Conjunctivae and lids are normal. Right eye exhibits no discharge. Left eye exhibits no discharge. No scleral icterus.  Cardiovascular: Normal rate, regular rhythm, normal heart sounds and intact distal pulses. Exam reveals no gallop and no friction rub.  No murmur heard. Pulmonary/Chest: Effort normal and breath sounds normal. No stridor. No respiratory distress. He has no wheezes. He has no rales. He exhibits no tenderness.  Musculoskeletal: He exhibits edema and tenderness. He exhibits no deformity.  Tenderness to palpation of R thumb, slight swelling and bruising  Neurological: He is  alert and oriented to person, place, and time.  Skin: Skin is warm and intact. Capillary refill takes less than 2 seconds. No rash noted. He is not diaphoretic. No erythema. No pallor.  Subungual hematoma on R thumbnail and bruising and swelling R thumb  Psychiatric: He has a normal mood and affect. His speech is normal and behavior is normal. Judgment and thought content normal. Cognition and  memory are normal.  Nursing note and vitals reviewed.   Results for orders placed or performed in visit on 04/16/17  Microscopic Examination  Result Value Ref Range   WBC, UA 0-5 0 - 5 /hpf   RBC, UA 0-2 0 - 2 /hpf   Epithelial Cells (non renal) CANCELED    Bacteria, UA None seen None seen/Few  Bayer DCA Hb A1c Waived  Result Value Ref Range   Bayer DCA Hb A1c Waived 8.6 (H) <7.0 %  UA/M w/rflx Culture, Routine  Result Value Ref Range   Specific Gravity, UA 1.015 1.005 - 1.030   pH, UA 5.5 5.0 - 7.5   Color, UA Yellow Yellow   Appearance Ur Clear Clear   Leukocytes, UA Negative Negative   Protein, UA 1+ (A) Negative/Trace   Glucose, UA Negative Negative   Ketones, UA Negative Negative   RBC, UA Trace (A) Negative   Bilirubin, UA Negative Negative   Urobilinogen, Ur 0.2 0.2 - 1.0 mg/dL   Nitrite, UA Negative Negative   Microscopic Examination See below:       Assessment & Plan:   Problem List Items Addressed This Visit      Endocrine   Type II diabetes mellitus with renal manifestations, uncontrolled ([facility]) - Primary    Better, with A1c of 8.6- will increase xultophy to 50units and recheck 3 months. Call with any concerns.       Relevant Medications   rosuvastatin (CRESTOR) 40 MG tablet   metFORMIN (GLUCOPHAGE) 1000 MG tablet   lisinopril (PRINIVIL,ZESTRIL) 10 MG tablet   Insulin Degludec-Liraglutide (XULTOPHY) 100-3.6 UNIT-MG/ML SOPN   Other Relevant Orders   CBC with Differential/Platelet   Bayer DCA Hb A1c Waived (Completed)   Comprehensive metabolic panel   TSH   UA/M w/rflx Culture, Routine (Completed)   Erectile dysfunction associated with type 2 diabetes mellitus ([place])    Continue working on getting sugars under better control.      Relevant Medications   rosuvastatin (CRESTOR) 40 MG tablet   metFORMIN (GLUCOPHAGE) 1000 MG tablet   lisinopril (PRINIVIL,ZESTRIL) 10 MG tablet   Insulin Degludec-Liraglutide (XULTOPHY) 100-3.6 UNIT-MG/ML SOPN      Genitourinary   Benign hypertensive renal disease    Under good control on current regimen. Continue to monitor. Call with any concerns.       Relevant Orders   CBC with Differential/Platelet   Comprehensive metabolic panel   TSH   UA/M w/rflx Culture, Routine (Completed)   Benign prostatic hyperplasia    Checking PSA today. Await results.       Relevant Orders   CBC with Differential/Platelet   Comprehensive metabolic panel   PSA   UA/M w/rflx Culture, Routine (Completed)     Other   Hyperlipidemia    Under good control on current regimen. Continue to monitor. Call with any concerns.       Relevant Medications   rosuvastatin (CRESTOR) 40 MG tablet   lisinopril (PRINIVIL,ZESTRIL) 10 MG tablet   Other Relevant Orders   CBC with Differential/Platelet   Comprehensive metabolic panel   Lipid Panel  w/o Chol/HDL Ratio   UA/M w/rflx Culture, Routine (Completed)    Other Visit Diagnoses    Pain of right thumb       Will obtain x-ray. Too late to drain. Start tramadol and naproxen PRN for pain. Call with any concerns. Await results.    Relevant Orders   DG Finger Thumb Right       Follow up plan: Return in about 3 months (around 07/16/2017) for DM visit.

## 2017-04-16 NOTE — Assessment & Plan Note (Signed)
Continue working on getting sugars under better control.

## 2017-04-17 LAB — CBC WITH DIFFERENTIAL/PLATELET
BASOS: 0 %
Basophils Absolute: 0 10*3/uL (ref 0.0–0.2)
EOS (ABSOLUTE): 0.2 10*3/uL (ref 0.0–0.4)
EOS: 3 %
HEMATOCRIT: 37.7 % (ref 37.5–51.0)
Hemoglobin: 12.5 g/dL — ABNORMAL LOW (ref 13.0–17.7)
IMMATURE GRANS (ABS): 0 10*3/uL (ref 0.0–0.1)
IMMATURE GRANULOCYTES: 0 %
LYMPHS: 31 %
Lymphocytes Absolute: 1.6 10*3/uL (ref 0.7–3.1)
MCH: 25.9 pg — ABNORMAL LOW (ref 26.6–33.0)
MCHC: 33.2 g/dL (ref 31.5–35.7)
MCV: 78 fL — AB (ref 79–97)
Monocytes Absolute: 0.5 10*3/uL (ref 0.1–0.9)
Monocytes: 10 %
NEUTROS PCT: 56 %
Neutrophils Absolute: 2.9 10*3/uL (ref 1.4–7.0)
Platelets: 290 10*3/uL (ref 150–379)
RBC: 4.83 x10E6/uL (ref 4.14–5.80)
RDW: 15.4 % (ref 12.3–15.4)
WBC: 5.1 10*3/uL (ref 3.4–10.8)

## 2017-04-17 LAB — PSA: PROSTATE SPECIFIC AG, SERUM: 0.8 ng/mL (ref 0.0–4.0)

## 2017-04-17 LAB — COMPREHENSIVE METABOLIC PANEL
ALBUMIN: 4.4 g/dL (ref 3.5–5.5)
ALT: 21 IU/L (ref 0–44)
AST: 34 IU/L (ref 0–40)
Albumin/Globulin Ratio: 1.8 (ref 1.2–2.2)
Alkaline Phosphatase: 79 IU/L (ref 39–117)
BUN / CREAT RATIO: 13 (ref 9–20)
BUN: 16 mg/dL (ref 6–24)
Bilirubin Total: <0.2 mg/dL (ref 0.0–1.2)
CALCIUM: 9.8 mg/dL (ref 8.7–10.2)
CO2: 21 mmol/L (ref 20–29)
CREATININE: 1.28 mg/dL — AB (ref 0.76–1.27)
Chloride: 104 mmol/L (ref 96–106)
GFR calc Af Amer: 73 mL/min/{1.73_m2} (ref >59)
GFR, EST NON AFRICAN AMERICAN: 63 mL/min/{1.73_m2} (ref >59)
GLOBULIN, TOTAL: 2.4 g/dL (ref 1.5–4.5)
Glucose: 62 mg/dL — ABNORMAL LOW (ref 65–99)
Potassium: 4.8 mmol/L (ref 3.5–5.2)
Sodium: 141 mmol/L (ref 134–144)
TOTAL PROTEIN: 6.8 g/dL (ref 6.0–8.5)

## 2017-04-17 LAB — LIPID PANEL W/O CHOL/HDL RATIO
Cholesterol, Total: 145 mg/dL (ref 100–199)
HDL: 49 mg/dL (ref >39)
LDL CALC: 83 mg/dL (ref 0–99)
TRIGLYCERIDES: 64 mg/dL (ref 0–149)
VLDL Cholesterol Cal: 13 mg/dL (ref 5–40)

## 2017-04-17 LAB — TSH: TSH: 2.32 u[IU]/mL (ref 0.450–4.500)

## 2017-05-03 ENCOUNTER — Encounter: Payer: BLUE CROSS/BLUE SHIELD | Admitting: Family Medicine

## 2017-06-23 ENCOUNTER — Encounter: Payer: Self-pay | Admitting: Family Medicine

## 2017-07-19 ENCOUNTER — Encounter: Payer: Self-pay | Admitting: Family Medicine

## 2017-07-19 LAB — HM DIABETES EYE EXAM

## 2017-07-20 ENCOUNTER — Ambulatory Visit: Payer: BLUE CROSS/BLUE SHIELD | Admitting: Family Medicine

## 2017-07-30 ENCOUNTER — Ambulatory Visit: Payer: BLUE CROSS/BLUE SHIELD | Admitting: Family Medicine

## 2017-08-10 ENCOUNTER — Other Ambulatory Visit: Payer: Self-pay

## 2017-08-10 ENCOUNTER — Encounter: Payer: Self-pay | Admitting: Physician Assistant

## 2017-08-10 ENCOUNTER — Ambulatory Visit (INDEPENDENT_AMBULATORY_CARE_PROVIDER_SITE_OTHER): Payer: BLUE CROSS/BLUE SHIELD | Admitting: Physician Assistant

## 2017-08-10 ENCOUNTER — Ambulatory Visit
Admission: RE | Admit: 2017-08-10 | Discharge: 2017-08-10 | Disposition: A | Discharge status: Home / Self Care | Payer: Self-pay | Source: Ambulatory Visit | Attending: Physician Assistant | Admitting: Physician Assistant

## 2017-08-10 VITALS — BP 119/82 | HR 79 | Temp 97.7°F | Ht 67.0 in | Wt 154.6 lb

## 2017-08-10 DIAGNOSIS — M542 Cervicalgia: Secondary | ICD-10-CM

## 2017-08-10 DIAGNOSIS — M25511 Pain in right shoulder: Secondary | ICD-10-CM

## 2017-08-10 DIAGNOSIS — M2578 Osteophyte, vertebrae: Secondary | ICD-10-CM | POA: Insufficient documentation

## 2017-08-10 DIAGNOSIS — R55 Syncope and collapse: Secondary | ICD-10-CM | POA: Insufficient documentation

## 2017-08-10 MED ORDER — CYCLOBENZAPRINE HCL 10 MG PO TABS: 10.0000 mg | ORAL_TABLET | Freq: Two times a day (BID) | ORAL | 0 refills | Status: DC | PRN

## 2017-08-10 NOTE — Patient Instructions (Signed)
Cervical Radiculopathy  Cervical radiculopathy means that a nerve in the neck is pinched or bruised. This can cause pain or loss of feeling (numbness) that runs from your neck to your arm and fingers.  Follow these instructions at home:  Managing pain  ? Take over-the-counter and prescription medicines only as told by your doctor.  ? If directed, put ice on the injured or painful area.  ? Put ice in a plastic bag.  ? Place a towel between your skin and the bag.  ? Leave the ice on for 20 minutes, 2?3 times per day.  ? If ice does not help, you can try using heat. Take a warm shower or warm bath, or use a heat pack as told by your doctor.  ? You may try a gentle neck and shoulder massage.  Activity  ? Rest as needed. Follow instructions from your doctor about any activities to avoid.  ? Do exercises as told by your doctor or physical therapist.  General instructions  ? If you were given a soft collar, wear it as told by your doctor.  ? Use a flat pillow when you sleep.  ? Keep all follow-up visits as told by your doctor. This is important.  Contact a doctor if:  ? Your condition does not improve with treatment.  Get help right away if:  ? Your pain gets worse and is not controlled with medicine.  ? You lose feeling or feel weak in your hand, arm, face, or leg.  ? You have a fever.  ? You have a stiff neck.  ? You cannot control when you poop or pee (have incontinence).  ? You have trouble with walking, balance, or talking.  This information is not intended to replace advice given to you by your health care provider. Make sure you discuss any questions you have with your health care provider.  Document Released: 12/11/2010 Document Revised: 05/30/2015 Document Reviewed: 02/15/2014  Elsevier Interactive Patient Education ? 2018 [employer]
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
## 2017-08-11 ENCOUNTER — Telehealth: Payer: Self-pay | Admitting: Family Medicine

## 2017-08-11 ENCOUNTER — Telehealth: Payer: Self-pay

## 2017-08-11 DIAGNOSIS — E1129 Type 2 diabetes mellitus with other diabetic kidney complication: Secondary | ICD-10-CM

## 2017-08-11 DIAGNOSIS — E1165 Type 2 diabetes mellitus with hyperglycemia: Principal | ICD-10-CM

## 2017-08-11 DIAGNOSIS — IMO0002 Reserved for concepts with insufficient information to code with codable children: Secondary | ICD-10-CM

## 2017-08-11 MED ORDER — INSULIN DEGLUDEC-LIRAGLUTIDE 100-3.6 UNIT-MG/ML ~~LOC~~ SOPN: 50.0000 [IU] | PEN_INJECTOR | Freq: Every day | SUBCUTANEOUS | 0 refills | Status: DC

## 2017-08-11 NOTE — Telephone Encounter (Signed)
15 mL for Xultophy sent.

## 2017-08-11 NOTE — Telephone Encounter (Signed)
Pharmacy sent a fax regarding the patient's Xultophy prescription. They state that the patient would like to get a whole box of medication which is 15 mL. Can we change the RX from 3 mL to 15 mL and send in for the patient? Please advise.

## 2017-08-12 NOTE — Telephone Encounter (Signed)
ERROR

## 2017-08-24 ENCOUNTER — Ambulatory Visit (INDEPENDENT_AMBULATORY_CARE_PROVIDER_SITE_OTHER): Payer: BLUE CROSS/BLUE SHIELD | Admitting: Physician Assistant

## 2017-08-24 ENCOUNTER — Encounter: Payer: Self-pay | Admitting: Physician Assistant

## 2017-08-24 ENCOUNTER — Other Ambulatory Visit: Payer: Self-pay

## 2017-08-24 VITALS — BP 122/83 | HR 88 | Temp 97.5°F | Ht 67.0 in | Wt 156.1 lb

## 2017-08-24 DIAGNOSIS — M79601 Pain in right arm: Secondary | ICD-10-CM | POA: Diagnosis not present

## 2017-08-24 DIAGNOSIS — Z23 Encounter for immunization: Secondary | ICD-10-CM

## 2017-08-24 DIAGNOSIS — M542 Cervicalgia: Secondary | ICD-10-CM

## 2017-08-24 MED ORDER — MELOXICAM 15 MG PO TABS: 15.0000 mg | ORAL_TABLET | Freq: Every day | ORAL | 0 refills | Status: DC
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
## 2017-08-27 ENCOUNTER — Other Ambulatory Visit: Payer: Self-pay

## 2017-08-27 DIAGNOSIS — E1165 Type 2 diabetes mellitus with hyperglycemia: Principal | ICD-10-CM

## 2017-08-27 DIAGNOSIS — IMO0002 Reserved for concepts with insufficient information to code with codable children: Secondary | ICD-10-CM

## 2017-08-27 DIAGNOSIS — E1129 Type 2 diabetes mellitus with other diabetic kidney complication: Secondary | ICD-10-CM

## 2017-08-27 MED ORDER — INSULIN DEGLUDEC-LIRAGLUTIDE 100-3.6 UNIT-MG/ML ~~LOC~~ SOPN: 50.0000 [IU] | PEN_INJECTOR | Freq: Every day | SUBCUTANEOUS | 0 refills | Status: DC

## 2017-08-27 NOTE — Telephone Encounter (Signed)
Patient's last diabetic f/up was in 04/2017.

## 2017-09-02 DIAGNOSIS — M25511 Pain in right shoulder: Secondary | ICD-10-CM | POA: Diagnosis not present

## 2017-09-09 DIAGNOSIS — M79601 Pain in right arm: Secondary | ICD-10-CM | POA: Diagnosis not present

## 2017-09-09 DIAGNOSIS — Z23 Encounter for immunization: Secondary | ICD-10-CM

## 2017-09-20 DIAGNOSIS — M25511 Pain in right shoulder: Secondary | ICD-10-CM | POA: Diagnosis not present

## 2017-09-20 DIAGNOSIS — M542 Cervicalgia: Secondary | ICD-10-CM | POA: Diagnosis not present

## 2017-09-23 DIAGNOSIS — S46011A Strain of muscle(s) and tendon(s) of the rotator cuff of right shoulder, initial encounter: Secondary | ICD-10-CM | POA: Diagnosis not present

## 2017-10-13 DIAGNOSIS — M25511 Pain in right shoulder: Secondary | ICD-10-CM | POA: Diagnosis not present

## 2017-10-13 DIAGNOSIS — S134XXD Sprain of ligaments of cervical spine, subsequent encounter: Secondary | ICD-10-CM | POA: Diagnosis not present

## 2017-10-14 DIAGNOSIS — M25511 Pain in right shoulder: Secondary | ICD-10-CM | POA: Diagnosis not present

## 2017-10-14 DIAGNOSIS — S134XXD Sprain of ligaments of cervical spine, subsequent encounter: Secondary | ICD-10-CM | POA: Diagnosis not present

## 2017-10-19 DIAGNOSIS — M25511 Pain in right shoulder: Secondary | ICD-10-CM | POA: Diagnosis not present

## 2017-10-19 DIAGNOSIS — S134XXD Sprain of ligaments of cervical spine, subsequent encounter: Secondary | ICD-10-CM | POA: Diagnosis not present

## 2017-10-21 DIAGNOSIS — M25511 Pain in right shoulder: Secondary | ICD-10-CM | POA: Diagnosis not present

## 2017-10-21 DIAGNOSIS — S134XXD Sprain of ligaments of cervical spine, subsequent encounter: Secondary | ICD-10-CM | POA: Diagnosis not present

## 2017-10-26 DIAGNOSIS — M25511 Pain in right shoulder: Secondary | ICD-10-CM | POA: Diagnosis not present

## 2017-10-26 DIAGNOSIS — S134XXD Sprain of ligaments of cervical spine, subsequent encounter: Secondary | ICD-10-CM | POA: Diagnosis not present

## 2017-10-28 DIAGNOSIS — S134XXD Sprain of ligaments of cervical spine, subsequent encounter: Secondary | ICD-10-CM | POA: Diagnosis not present

## 2017-10-28 DIAGNOSIS — M25511 Pain in right shoulder: Secondary | ICD-10-CM | POA: Diagnosis not present

## 2017-10-29 ENCOUNTER — Telehealth: Payer: Self-pay | Admitting: Family Medicine

## 2017-10-29 NOTE — Telephone Encounter (Signed)
Pt scheduled to come in next Wednesday

## 2017-10-29 NOTE — Telephone Encounter (Signed)
Pt states that he's about to lose his insurance at the end of the month and wants to know if you can write him prescriptions for his diabetic medicine. Advised may need appointment but checking with you first. Please advise. Still uses [address] [place].

## 2017-10-29 NOTE — Telephone Encounter (Signed)
Needs appointment. I have not seen him since April

## 2017-11-01 DIAGNOSIS — M25511 Pain in right shoulder: Secondary | ICD-10-CM | POA: Diagnosis not present

## 2017-11-01 DIAGNOSIS — S134XXD Sprain of ligaments of cervical spine, subsequent encounter: Secondary | ICD-10-CM | POA: Diagnosis not present

## 2017-11-03 ENCOUNTER — Ambulatory Visit (INDEPENDENT_AMBULATORY_CARE_PROVIDER_SITE_OTHER): Payer: BLUE CROSS/BLUE SHIELD | Admitting: Family Medicine

## 2017-11-03 ENCOUNTER — Encounter: Payer: Self-pay | Admitting: Family Medicine

## 2017-11-03 ENCOUNTER — Other Ambulatory Visit: Payer: Self-pay | Admitting: Family Medicine

## 2017-11-03 VITALS — BP 134/87 | HR 82 | Temp 98.8°F | Wt 157.4 lb

## 2017-11-03 DIAGNOSIS — E1129 Type 2 diabetes mellitus with other diabetic kidney complication: Secondary | ICD-10-CM

## 2017-11-03 DIAGNOSIS — E1165 Type 2 diabetes mellitus with hyperglycemia: Secondary | ICD-10-CM

## 2017-11-03 DIAGNOSIS — I129 Hypertensive chronic kidney disease with stage 1 through stage 4 chronic kidney disease, or unspecified chronic kidney disease: Secondary | ICD-10-CM

## 2017-11-03 DIAGNOSIS — IMO0002 Reserved for concepts with insufficient information to code with codable children: Secondary | ICD-10-CM

## 2017-11-03 DIAGNOSIS — E782 Mixed hyperlipidemia: Secondary | ICD-10-CM

## 2017-11-03 LAB — MICROALBUMIN, URINE WAIVED
CREATININE, URINE WAIVED: 100 mg/dL (ref 10–300)
MICROALB, UR WAIVED: 150 mg/L — AB (ref 0–19)

## 2017-11-03 LAB — BAYER DCA HB A1C WAIVED: HB A1C (BAYER DCA - WAIVED): 11.7 % — ABNORMAL HIGH (ref <7.0)

## 2017-11-03 MED ORDER — ROSUVASTATIN CALCIUM 40 MG PO TABS: 40.0000 mg | ORAL_TABLET | Freq: Every day | ORAL | 1 refills | Status: DC

## 2017-11-03 MED ORDER — INSULIN DEGLUDEC-LIRAGLUTIDE 100-3.6 UNIT-MG/ML ~~LOC~~ SOPN: 50.0000 [IU] | PEN_INJECTOR | Freq: Every day | SUBCUTANEOUS | 1 refills | Status: DC

## 2017-11-03 MED ORDER — METFORMIN HCL 1000 MG PO TABS: ORAL_TABLET | ORAL | 1 refills | Status: DC

## 2017-11-03 MED ORDER — LISINOPRIL 10 MG PO TABS: 15.0000 mg | ORAL_TABLET | Freq: Every day | ORAL | 1 refills | Status: DC

## 2017-11-03 MED ORDER — INSULIN PEN NEEDLE 32G X 4 MM MISC: 4 refills | Status: DC

## 2017-11-03 MED ORDER — ATORVASTATIN CALCIUM 40 MG PO TABS: 80.0000 mg | ORAL_TABLET | Freq: Every day | ORAL | 1 refills | Status: DC

## 2017-11-03 NOTE — Assessment & Plan Note (Signed)
Under good control on current regimen. Continue current regimen. Continue to monitor. Call with any concerns. Refills given. Checking labs today.  

## 2017-11-03 NOTE — Telephone Encounter (Signed)
Pt calling back with a correct number for [facility]. [phone]

## 2017-11-03 NOTE — Telephone Encounter (Signed)
Copied from [place] [id number]. Topic: Quick Communication - See Telephone Encounter >> Nov 03, 2017 11:32 AM Ivar Drape wrote: CRM for notification. See Telephone encounter for: 11/03/17. Patient's Insulin Degludec-Liraglutide (XULTOPHY) 100-3.6 UNIT-MG/ML SOPN medication was sent to [facility] with a one month supply, but the prescription should have been sent to the following with a 42mth supply:  [facility] [phone] [place] #[id number]  Rx [place] [phone]  Patient would like for this to be done today if possible, because his insurance runs out tomorrow.

## 2017-11-03 NOTE — Assessment & Plan Note (Signed)
A1c worse today at 11.7- has not been taking his medicine like he should. Continue current regimen. Paperwork for patient assistance for xultophy given as he's losing his insurance. Call with any concerns. Recheck 3 months.

## 2017-11-03 NOTE — Telephone Encounter (Signed)
Patient calling and states that the medication needs to [facility] for a 90 day supply. [facility] that [facility] also [facility] a [facility] regarding the medication.   CB#: [phone]

## 2017-11-03 NOTE — Progress Notes (Signed)
BP 134/87 (BP Location: Left Arm, Patient Position: Sitting, Cuff Size: Normal)   Pulse 82   Temp 98.8 F (37.1 C)   Wt 157 lb 7 oz (71.4 kg)   SpO2 100%   BMI 24.66 kg/m    Subjective:    Patient ID: Arthur [name], male    DOB: 14-Sep-1965, 52 y.o.   MRN: [id number]  HPI: Arthur [name] is a 52 y.o. male  Chief Complaint  Patient presents with  . Diabetes  . Hyperlipidemia  . Hypertension   HYPERTENSION / [place] Satisfied with current treatment? yes Duration of hypertension: chronic BP monitoring frequency: not checking BP medication side effects: no Past BP meds: lisinopril Duration of hyperlipidemia: chronic Cholesterol medication side effects: no Cholesterol supplements: none Past cholesterol medications: crestor Medication compliance: good compliance Aspirin: no Recent stressors: no Recurrent headaches: no Visual changes: no Palpitations: no Dyspnea: no Chest pain: no Lower extremity edema: no Dizzy/lightheaded: no  DIABETES Hypoglycemic episodes:no Polydipsia/polyuria: no Visual disturbance: no Chest pain: no Paresthesias: no Glucose Monitoring: no  Accucheck frequency: Daily Taking Insulin?: yes Blood Pressure Monitoring: not checking Retinal Examination: Up to Date Foot Exam: Up to Date Diabetic Education: Completed Pneumovax: Up to Date Influenza: Up to Date Aspirin: no  Relevant past medical, surgical, family and social history reviewed and updated as indicated. Interim medical history since our last visit reviewed. Allergies and medications reviewed and updated.  Review of Systems  Constitutional: Negative.   Respiratory: Negative.   Cardiovascular: Negative.   Neurological: Negative.   Psychiatric/Behavioral: Negative.     Per HPI unless specifically indicated above     Objective:    BP 134/87 (BP Location: Left Arm, Patient Position: Sitting, Cuff Size: Normal)   Pulse 82   Temp 98.8 F (37.1 C)   Wt 157 lb 7  oz (71.4 kg)   SpO2 100%   BMI 24.66 kg/m   Wt Readings from Last 3 Encounters:  11/03/17 157 lb 7 oz (71.4 kg)  08/24/17 156 lb 1.6 oz (70.8 kg)  08/10/17 154 lb 9.6 oz (70.1 kg)    Physical Exam  Constitutional: He is oriented to person, place, and time. He appears well-developed and well-nourished. No distress.  HENT:  Head: Normocephalic and atraumatic.  Right Ear: Hearing normal.  Left Ear: Hearing normal.  Nose: Nose normal.  Eyes: Conjunctivae and lids are normal. Right eye exhibits no discharge. Left eye exhibits no discharge. No scleral icterus.  Cardiovascular: Normal rate, regular rhythm, normal heart sounds and intact distal pulses. Exam reveals no gallop and no friction rub.  No murmur heard. Pulmonary/Chest: Effort normal and breath sounds normal. No stridor. No respiratory distress. He has no wheezes. He has no rales. He exhibits no tenderness.  Musculoskeletal: Normal range of motion.  Neurological: He is alert and oriented to person, place, and time.  Skin: Skin is warm, dry and intact. Capillary refill takes less than 2 seconds. No rash noted. He is not diaphoretic. No erythema. No pallor.  Psychiatric: He has a normal mood and affect. His speech is normal and behavior is normal. Judgment and thought content normal. Cognition and memory are normal.  Nursing note and vitals reviewed.   Results for orders placed or performed in visit on 07/22/17  HM DIABETES EYE EXAM  Result Value Ref Range   HM Diabetic Eye Exam No Retinopathy No Retinopathy      Assessment & Plan:   Problem List Items Addressed This Visit  Endocrine   Type II diabetes mellitus with renal manifestations, uncontrolled (HCC) - Primary    A1c worse today at 11.7- has not been taking his medicine like he should. Continue current regimen. Paperwork for patient assistance for xultophy given as he's losing his insurance. Call with any concerns. Recheck 3 months.       Relevant Medications    metFORMIN (GLUCOPHAGE) 1000 MG tablet   lisinopril (PRINIVIL,ZESTRIL) 10 MG tablet   Insulin Degludec-Liraglutide (XULTOPHY) 100-3.6 UNIT-MG/ML SOPN   atorvastatin (LIPITOR) 40 MG tablet   Other Relevant Orders   Bayer DCA Hb A1c Waived   Comprehensive metabolic panel   Microalbumin, Urine Waived     Genitourinary   Benign hypertensive renal disease    Under good control on current regimen. Continue current regimen. Continue to monitor. Call with any concerns. Refills given. Checking labs today.        Relevant Orders   Comprehensive metabolic panel   Microalbumin, Urine Waived     Other   Hyperlipidemia    To be losing his insurance. Will continue crestor for now, but will send through Atorvastatin 80mg  to fill in 3 months as it's less expensive without insurance. Checking labs today. Continue to monitor.      Relevant Medications   lisinopril (PRINIVIL,ZESTRIL) 10 MG tablet   atorvastatin (LIPITOR) 40 MG tablet   Other Relevant Orders   Comprehensive metabolic panel   Lipid Panel w/o Chol/HDL Ratio       Follow up plan: Return in about 3 months (around 02/03/2018) for DM follow up.

## 2017-11-03 NOTE — Assessment & Plan Note (Signed)
To be losing his insurance. Will continue crestor for now, but will send through Atorvastatin 80mg  to fill in 3 months as it's less expensive without insurance. Checking labs today. Continue to monitor.

## 2017-11-04 DIAGNOSIS — M25511 Pain in right shoulder: Secondary | ICD-10-CM | POA: Diagnosis not present

## 2017-11-04 DIAGNOSIS — S134XXD Sprain of ligaments of cervical spine, subsequent encounter: Secondary | ICD-10-CM | POA: Diagnosis not present

## 2017-11-04 LAB — LIPID PANEL W/O CHOL/HDL RATIO
Cholesterol, Total: 135 mg/dL (ref 100–199)
HDL: 33 mg/dL — AB (ref >39)
LDL Calculated: 66 mg/dL (ref 0–99)
Triglycerides: 181 mg/dL — ABNORMAL HIGH (ref 0–149)
VLDL CHOLESTEROL CAL: 36 mg/dL (ref 5–40)

## 2017-11-04 LAB — COMPREHENSIVE METABOLIC PANEL
ALT: 15 IU/L (ref 0–44)
AST: 19 IU/L (ref 0–40)
Albumin/Globulin Ratio: 2.2 (ref 1.2–2.2)
Albumin: 4.3 g/dL (ref 3.5–5.5)
Alkaline Phosphatase: 77 IU/L (ref 39–117)
BILIRUBIN TOTAL: 0.2 mg/dL (ref 0.0–1.2)
BUN/Creatinine Ratio: 13 (ref 9–20)
BUN: 14 mg/dL (ref 6–24)
CALCIUM: 9.9 mg/dL (ref 8.7–10.2)
CO2: 22 mmol/L (ref 20–29)
Chloride: 101 mmol/L (ref 96–106)
Creatinine, Ser: 1.09 mg/dL (ref 0.76–1.27)
GFR calc non Af Amer: 77 mL/min/{1.73_m2} (ref >59)
GFR, EST AFRICAN AMERICAN: 88 mL/min/{1.73_m2} (ref >59)
GLUCOSE: 186 mg/dL — AB (ref 65–99)
Globulin, Total: 2 g/dL (ref 1.5–4.5)
Potassium: 4.2 mmol/L (ref 3.5–5.2)
Sodium: 138 mmol/L (ref 134–144)
TOTAL PROTEIN: 6.3 g/dL (ref 6.0–8.5)

## 2017-11-04 MED ORDER — INSULIN DEGLUDEC-LIRAGLUTIDE 100-3.6 UNIT-MG/ML ~~LOC~~ SOPN: 50.0000 [IU] | PEN_INJECTOR | Freq: Every day | SUBCUTANEOUS | 1 refills | Status: DC

## 2017-11-05 ENCOUNTER — Telehealth: Payer: Self-pay

## 2017-11-05 NOTE — Telephone Encounter (Signed)
Copied from [place] [id number]. Topic: General - Other >> Nov 05, 2017  2:03 PM Yvette Rack wrote: Reason for CRM: Pt states he requested a 90 day supply for the Insulin Degludec-Liraglutide (XULTOPHY) 100-3.6 UNIT-MG/ML SOPN but when he spoke with [facility] he was advised that the request was on for a 30 day supply. Pt states his insurance ended at 12:01 am today 11/05/17 and the cost would be $1200 each month. Cb# [phone]   Routing to provider.

## 2017-11-05 NOTE — Telephone Encounter (Signed)
I sent through a 90 day supply. His insurance may only give him a 30 day supply, but it was sent as a 90 day supply

## 2017-11-05 NOTE — Telephone Encounter (Signed)
LVM for patient regarding what Dr. Wynetta Emery said.  (DPR reviewed)  Explained someone would call back again on Monday to explain further.

## 2017-11-08 NOTE — Telephone Encounter (Signed)
Spoke with patient and he agreed on what Dr. Wynetta Emery advised. Patient stated his medication would arrive to him tomorrow, and he has enough to last him till then.  Patient was very thankful for the practice and Dr. Wynetta Emery.

## 2017-11-09 DIAGNOSIS — M25511 Pain in right shoulder: Secondary | ICD-10-CM | POA: Diagnosis not present

## 2017-11-09 DIAGNOSIS — S134XXD Sprain of ligaments of cervical spine, subsequent encounter: Secondary | ICD-10-CM | POA: Diagnosis not present

## 2017-11-11 DIAGNOSIS — M25511 Pain in right shoulder: Secondary | ICD-10-CM | POA: Diagnosis not present

## 2017-11-11 DIAGNOSIS — S134XXD Sprain of ligaments of cervical spine, subsequent encounter: Secondary | ICD-10-CM | POA: Diagnosis not present

## 2017-11-30 DIAGNOSIS — S134XXD Sprain of ligaments of cervical spine, subsequent encounter: Secondary | ICD-10-CM | POA: Diagnosis not present

## 2017-11-30 DIAGNOSIS — M25511 Pain in right shoulder: Secondary | ICD-10-CM | POA: Diagnosis not present

## 2017-12-07 DIAGNOSIS — M25511 Pain in right shoulder: Secondary | ICD-10-CM | POA: Diagnosis not present

## 2017-12-07 DIAGNOSIS — S134XXD Sprain of ligaments of cervical spine, subsequent encounter: Secondary | ICD-10-CM | POA: Diagnosis not present

## 2018-02-07 ENCOUNTER — Ambulatory Visit: Payer: BLUE CROSS/BLUE SHIELD | Admitting: Family Medicine

## 2018-02-17 ENCOUNTER — Telehealth: Payer: Self-pay | Admitting: Neurology

## 2018-02-17 ENCOUNTER — Encounter

## 2018-02-17 ENCOUNTER — Ambulatory Visit: Payer: Managed Care, Other (non HMO) | Admitting: Neurology

## 2018-02-17 ENCOUNTER — Ambulatory Visit (INDEPENDENT_AMBULATORY_CARE_PROVIDER_SITE_OTHER): Payer: Managed Care, Other (non HMO) | Admitting: Neurology

## 2018-02-17 ENCOUNTER — Encounter: Payer: Self-pay | Admitting: Neurology

## 2018-02-17 VITALS — BP 127/80 | HR 98 | Ht 67.0 in | Wt 159.6 lb

## 2018-02-17 DIAGNOSIS — Z0289 Encounter for other administrative examinations: Secondary | ICD-10-CM

## 2018-02-17 DIAGNOSIS — M542 Cervicalgia: Secondary | ICD-10-CM

## 2018-02-17 DIAGNOSIS — S161XXA Strain of muscle, fascia and tendon at neck level, initial encounter: Secondary | ICD-10-CM
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
## 2018-02-17 NOTE — Telephone Encounter (Signed)
Pt states he went about the Dry Needling and was told by the location at [facility] [address] and [place] that a referral is needed.  Please call

## 2018-02-17 NOTE — Telephone Encounter (Signed)
Hold on referral until I clarify with patient which location he wants the dry needling. I have called & LVM asking for call back asap.

## 2018-02-17 NOTE — Telephone Encounter (Signed)
Pt has returned call to [employer], he is asking for a call back

## 2018-02-17 NOTE — Telephone Encounter (Signed)
Noted sent referral to [place].

## 2018-02-17 NOTE — Telephone Encounter (Signed)
Spoke with patient. He would like to have his PT dry needling referral sent to [facility].

## 2018-02-17 NOTE — Telephone Encounter (Signed)
Returned pt's call and LVM asking for call back.  ?

## 2018-02-19 ENCOUNTER — Encounter: Payer: Self-pay | Admitting: Neurology

## 2018-02-19 NOTE — Progress Notes (Signed)
See procedure note.

## 2018-02-19 NOTE — Progress Notes (Signed)
Full Name: Arthur [name] Gender: Male MRN #: [id number] Date of Birth: 08/09/1965    Visit Date: 02/17/2018 08:45 Age: 53 Years 32 Months Old Examining Physician: Sarina Ill, MD  Primary Care Provider:  Valerie Roys, DO  History: Patient with neck pain, numb hands.    Summary: EMG/NCS was performed on the bilateral upper extremities. All nerves and muscles (as indicated in the following tables) were within normal limits.       Conclusion: This is a normal study of the bilateral upper extremities. No electrophysiologic evidence for mononeuropathy, polyneuropathy, plexopathy, radiculopathy or neuromuscular disorder.  Sarina Ill, M.D.  [facility] [address], [place] [address] Tel: [phone] Fax: [phone]        [facility]    Nerve / Sites Muscle Latency Ref. Amplitude Ref. Rel Amp Segments Distance Velocity Ref. Area    ms ms mV mV %  cm m/s m/s mVms  R Median - APB     Wrist APB 3.2 ?4.4 11.7 ?4.0 100 Wrist - APB 7   37.9     Upper arm APB 7.9  11.2  96.1 Upper arm - Wrist 23 49 ?49 37.0  L Median - APB     Wrist APB 3.3 ?4.4 11.3 ?4.0 100 Wrist - APB 7   38.4     Upper arm APB 8.0  10.1  89.9 Upper arm - Wrist 23 50 ?49 34.1  R Ulnar - ADM     Wrist ADM 2.5 ?3.3 8.0 ?6.0 100 Wrist - ADM 7   27.9     B.Elbow ADM 6.9  7.6  95.4 B.Elbow - Wrist 23 52 ?49 26.1     A.Elbow ADM 9.0  7.5  98.7 A.Elbow - B.Elbow 10 49 ?49 25.3         A.Elbow - Wrist      L Ulnar - ADM     Wrist ADM 2.7 ?3.3 8.0 ?6.0 100 Wrist - ADM 7   26.4     B.Elbow ADM 6.5  7.2  90.5 B.Elbow - Wrist 19 50 ?49 24.8     A.Elbow ADM 9.2  6.1  84.9 A.Elbow - B.Elbow 11 49 ?49 21.8         A.Elbow - Wrist                 SNC    Nerve / Sites Rec. Site Peak Lat Ref.  Amp Ref. Segments Distance    ms ms V V  cm  R Radial - Anatomical snuff box (Forearm)     Forearm Wrist 2.7 ?2.9 18 ?15 Forearm - Wrist 10  L Radial - Anatomical snuff box (Forearm)     Forearm  Wrist 2.4 ?2.9 17 ?15 Forearm - Wrist 10  R Median - Orthodromic (Dig II, Mid palm)     Dig II Wrist 2.9 ?3.4 12 ?10 Dig II - Wrist 13  L Median - Orthodromic (Dig II, Mid palm)     Dig II Wrist 3.0 ?3.4 13 ?10 Dig II - Wrist 13  R Ulnar - Orthodromic, (Dig V, Mid palm)     Dig V Wrist 2.9 ?3.1 7 ?5 Dig V - Wrist 11  L Ulnar - Orthodromic, (Dig V, Mid palm)     Dig V Wrist 2.7 ?3.1 6 ?5 Dig V - Wrist 11  R Lateral antebrachial cutaneous - Forearm (Elbow)     Elbow Forearm 2.7 ?3.0 19 ?10  Elbow - Forearm 12  R Medial antebrachial cutaneous - Forearm (Elbow)     Elbow Forearm 3.2 ?3.2 6 ?5 Elbow - Forearm 12                     F  Wave    Nerve F Lat Ref.   ms ms  R Ulnar - ADM 29.7 ?32.0  L Ulnar - ADM 32.0 ?32.0         EMG full       EMG Summary Table    Spontaneous MUAP Recruitment  Muscle IA Fib PSW Fasc Other Amp Dur. Poly Pattern  R. Biceps brachii Normal None None None _______ Normal Normal Normal Normal  R. Deltoid Normal None None None _______ Normal Normal Normal Normal  R. Triceps brachii Normal None None None _______ Normal Normal Normal Normal  R. Pronator teres Normal None None None _______ Normal Normal Normal Normal  R. First dorsal interosseous Normal None None None _______ Normal Normal Normal Normal  R. Cervical paraspinals (low) Normal None None None _______ Normal Normal Normal Normal  R. Supraspinatus Normal None None None _______ Normal Normal Normal Normal  R. Infraspinatus Normal None None None _______ Normal Normal Normal Normal  R. Trapezius Normal None None None _______ Normal Normal Normal Normal

## 2018-02-19 NOTE — Procedures (Signed)
Full Name: Arthur [name] Gender: Male MRN #: [id number] Date of Birth: 1965-11-05    Visit Date: 02/17/2018 08:45 Age: 53 Years 86 Months Old Examining Physician: Sarina Ill, MD  Primary Care Provider:  Valerie Roys, DO  History: Patient with neck pain, numb hands.    Summary: EMG/NCS was performed on the bilateral upper extremities. All nerves and muscles (as indicated in the following tables) were within normal limits.       Conclusion: This is a normal study of the bilateral upper extremities. No electrophysiologic evidence for mononeuropathy, polyneuropathy, plexopathy, radiculopathy or neuromuscular disorder.  Sarina Ill, M.D.  [facility] [place], [place] [address] Tel: [phone] Fax: [phone]        [facility]    Nerve / Sites Muscle Latency Ref. Amplitude Ref. Rel Amp Segments Distance Velocity Ref. Area    ms ms mV mV %  cm m/s m/s mVms  R Median - APB     Wrist APB 3.2 ?4.4 11.7 ?4.0 100 Wrist - APB 7   37.9     Upper arm APB 7.9  11.2  96.1 Upper arm - Wrist 23 49 ?49 37.0  L Median - APB     Wrist APB 3.3 ?4.4 11.3 ?4.0 100 Wrist - APB 7   38.4     Upper arm APB 8.0  10.1  89.9 Upper arm - Wrist 23 50 ?49 34.1  R Ulnar - ADM     Wrist ADM 2.5 ?3.3 8.0 ?6.0 100 Wrist - ADM 7   27.9     B.Elbow ADM 6.9  7.6  95.4 B.Elbow - Wrist 23 52 ?49 26.1     A.Elbow ADM 9.0  7.5  98.7 A.Elbow - B.Elbow 10 49 ?49 25.3         A.Elbow - Wrist      L Ulnar - ADM     Wrist ADM 2.7 ?3.3 8.0 ?6.0 100 Wrist - ADM 7   26.4     B.Elbow ADM 6.5  7.2  90.5 B.Elbow - Wrist 19 50 ?49 24.8     A.Elbow ADM 9.2  6.1  84.9 A.Elbow - B.Elbow 11 49 ?49 21.8         A.Elbow - Wrist                 SNC    Nerve / Sites Rec. Site Peak Lat Ref.  Amp Ref. Segments Distance    ms ms V V  cm  R Radial - Anatomical snuff box (Forearm)     Forearm Wrist 2.7 ?2.9 18 ?15 Forearm - Wrist 10  L Radial - Anatomical snuff box (Forearm)     Forearm  Wrist 2.4 ?2.9 17 ?15 Forearm - Wrist 10  R Median - Orthodromic (Dig II, Mid palm)     Dig II Wrist 2.9 ?3.4 12 ?10 Dig II - Wrist 13  L Median - Orthodromic (Dig II, Mid palm)     Dig II Wrist 3.0 ?3.4 13 ?10 Dig II - Wrist 13  R Ulnar - Orthodromic, (Dig V, Mid palm)     Dig V Wrist 2.9 ?3.1 7 ?5 Dig V - Wrist 11  L Ulnar - Orthodromic, (Dig V, Mid palm)     Dig V Wrist 2.7 ?3.1 6 ?5 Dig V - Wrist 11  R Lateral antebrachial cutaneous - Forearm (Elbow)     Elbow Forearm 2.7 ?3.0 19 ?10  Elbow - Forearm 12  R Medial antebrachial cutaneous - Forearm (Elbow)     Elbow Forearm 3.2 ?3.2 6 ?5 Elbow - Forearm 12                     F  Wave    Nerve F Lat Ref.   ms ms  R Ulnar - ADM 29.7 ?32.0  L Ulnar - ADM 32.0 ?32.0         EMG full       EMG Summary Table    Spontaneous MUAP Recruitment  Muscle IA Fib PSW Fasc Other Amp Dur. Poly Pattern  R. Biceps brachii Normal None None None _______ Normal Normal Normal Normal  R. Deltoid Normal None None None _______ Normal Normal Normal Normal  R. Triceps brachii Normal None None None _______ Normal Normal Normal Normal  R. Pronator teres Normal None None None _______ Normal Normal Normal Normal  R. First dorsal interosseous Normal None None None _______ Normal Normal Normal Normal  R. Cervical paraspinals (low) Normal None None None _______ Normal Normal Normal Normal  R. Supraspinatus Normal None None None _______ Normal Normal Normal Normal  R. Infraspinatus Normal None None None _______ Normal Normal Normal Normal  R. Trapezius Normal None None None _______ Normal Normal Normal Normal

## 2018-02-23 ENCOUNTER — Other Ambulatory Visit: Payer: Self-pay

## 2018-02-23 DIAGNOSIS — E1129 Type 2 diabetes mellitus with other diabetic kidney complication: Secondary | ICD-10-CM

## 2018-02-23 DIAGNOSIS — E1165 Type 2 diabetes mellitus with hyperglycemia: Principal | ICD-10-CM

## 2018-02-23 DIAGNOSIS — IMO0002 Reserved for concepts with insufficient information to code with codable children: Secondary | ICD-10-CM

## 2018-02-23 MED ORDER — INSULIN DEGLUDEC-LIRAGLUTIDE 100-3.6 UNIT-MG/ML ~~LOC~~ SOPN: 50.0000 [IU] | PEN_INJECTOR | Freq: Every day | SUBCUTANEOUS | 1 refills | Status: DC

## 2018-02-23 NOTE — Telephone Encounter (Signed)
[facility]  Advanced Heart Failure and Transplant  Heart Transplant Clinic  Follow-up Visit    Primary Care Physician: Brodsky, Mark E  Referring Provider: Brett Justin Berman  Date of Transplant: 02/24/2019  Organ(s) Transplanted: heart  Indication for transplant: Dilated Myopathy: Idiopathic  PHS increased risk donor: Yes    ID. 53 year old male with end-stage HFrEF 2/2 NICM s/p OHT 02/24/19, history of 2R, HTN, HLD and anxiety coming in for f/u of heart transplant.    Interval History:    The patient was last seen on 04/21/21. At that time issues with pain after urologic procedure.    He continues to deal with pain issue largely from prostate surgery. He still has some bleeding and some tissue come out. He tried different strategies and nothing helped. This is really impacting quality of life. He gets tired and frustrated and does not want to take it out on his family.    ROS:  A complete ROS was performed and is negative except as documented in the HPI.      Allergies:  Patient is allergic to cats [other] and dogs [other].    Past Medical History:   Diagnosis Date    Asthma     Atrial fibrillation (CMS-HCC)     Chronic HFrEF (heart failure with reduced ejection fraction) (CMS-HCC)     GERD (gastroesophageal reflux disease)     HTN (hypertension)     Insomnia     Nephrolithiasis     Sinusitis      Patient Active Problem List   Diagnosis    COPD (chronic obstructive pulmonary disease) (CMS-HCC)    Heart transplant, orthotopic, 02/24/2019    Pericardial effusion    Hypertension    Chronic back pain    At risk for infection transmitted from donor    Acute hepatitis C virus infection    Heart transplanted (CMS-HCC)    Acute UTI    Umbilical hernia without obstruction and without gangrene    COVID-19 virus detected    Acute medial meniscus tear of left knee, sequela    Localized osteoarthritis of left knee     Past Surgical History:   Procedure Laterality Date    CARDIAC DEFIBRILLATOR PLACEMENT       PB ANESTH,SHOULDER JOINT,NOS Right      Family History   Problem Relation Name Age of Onset    Hypertension Other      Other Maternal Grandmother          kidney disease needing HD     Social History     Socioeconomic History    Marital status: Single     Spouse name: Not on file    Number of children: Not on file    Years of education: Not on file    Highest education level: Not on file   Occupational History    Not on file   Tobacco Use    Smoking status: Never    Smokeless tobacco: Never    Tobacco comments:     from friends and relatives    Substance and Sexual Activity    Alcohol use: Not Currently     Comment: Prior heavier use, but completely quit in 2016    Drug use: Yes     Comment: eats edible marijuana for pain and insomnia     Sexual activity: Not on file   Other Topics Concern    Not on file   Social History Narrative      Born in [place], also lived in [place], [place], [place], no travel, worked as a carpenter, occasional cedar, no birds, no hot tubs, worked in construction + possible asbestos exposure      Social Determinants of Health     Financial Resource Strain: Not on file   Food Insecurity: Not on file   Transportation Needs: Not on file   Physical Activity: Not on file   Stress: Not on file   Social Connections: Not on file   Intimate Partner Violence: Not on file   Housing Stability: Not on file     Current Outpatient Medications   Medication Sig    albuterol 108 (90 Base) MCG/ACT inhaler Inhale 2 puffs by mouth every 4 hours as needed for Wheezing or Shortness of Breath.    aspirin 81 MG EC tablet Take 1 tablet (81 mg) by mouth daily.    baclofen (LIORESAL) 10 MG tablet Take 2 tablets (20 mg) by mouth nightly.    Blood Glucose Monitoring Suppl (TRUE METRIX METER) w/Device KIT Use as directed    budesonide-formoterol (SYMBICORT) 160-4.5 MCG/ACT inhaler Inhale 2 puffs by mouth every 12 hours.    bumetanide (BUMEX) 1 MG tablet Take 1 tablet (1 mg) by mouth daily as needed (fluid/weight  gain). Do not take unless instructed by Transplant team.    Calcium Carb-Cholecalciferol 600-10 MG-MCG TABS Take 1 tablet by mouth 2 times daily.    Cetirizine HCl (ZERVIATE) 0.24 % SOLN Place 1 drop into both eyes 2 times daily.    clindamycin (CLEOCIN T) 1 % solution Apply 1 Application. topically 2 times daily. Apply to the red bumps on your face up to two times a day.    controlled substance agreement controlled substance agreement    diclofenac (VOLTAREN) 1 % gel Apply 2 g topically 4 times daily.    docusate sodium (COLACE) 100 MG capsule Take 1 capsule (100 mg) by mouth 2 times daily.    DULoxetine (CYMBALTA) 30 MG CR capsule Take 1 capsule (30 mg) by mouth daily.    famotidine (PEPCID) 20 MG tablet Take 1 tablet (20 mg) by mouth 2 times daily.    fluticasone propionate (FLONASE) 50 MCG/ACT nasal spray Spray 1 spray into each nostril 2 times daily.    gabapentin (NEURONTIN) 300 MG capsule Take 1 capsule (300 mg) by mouth every morning AND 1 capsule (300 mg) daily AND 2 capsules (600 mg) every evening.    hydroCHLOROthiazide (HYDRODIURIL) 25 MG tablet Take 1 tablet (25 mg) by mouth daily.    ketoconazole (NIZORAL) 2 % shampoo Use shampoo daily for dandruff    lidocaine (LIDOCAINE PAIN RELIEF) 4 % patch Apply 1 patch topically every 24 hours. Leave patch on for 12 hours, then remove for 12 hours.    lisinopril (PRINIVIL, ZESTRIL) 10 MG tablet Take 2 tablets (20 mg) by mouth daily.    magnesium oxide (MAG-OX) 400 MG tablet Take 1 tablet by mouth daily    melatonin (GNP MELATONIN MAXIMUM STRENGTH) 5 MG tablet Take 2 tablets (10 mg) by mouth at bedtime.    Multiple Vitamin (MULTIVITAMIN) TABS tablet Take 1 tablet by mouth daily.    naloxone (KLOXXADO) 8 mg/0.1 mL nasal spray Call 911! Tilt head and spray intranasally into one nostril as needed for respiratory depression. If patient does not respond or responds and then relapses, repeat using a new nasal spray every 3 minutes until emergency medical assistance  arrives.    NEEDLE, DISP, 25 G 25G X   1" MISC Use to inject testosterone    NIFEdipine (ADALAT CC) 30 MG Controlled-Release tablet Take 1 tablet (30 mg) by mouth nightly.    ondansetron (ZOFRAN) 8 MG tablet Take 1 tablet (8 mg) by mouth every 8 hours as needed for Nausea/Vomiting.    oxyCODONE (ROXICODONE) 10 MG tablet Take 1 tab every 4 hours as needed for moderate pain and 2 tabs every 4 hours as needed for severe pain. Max 10 tabs per day, 28 day supply    phenazopyridine (PYRIDIUM) 100 MG tablet Take 1 tablet (100 mg) by mouth 3 times daily.    polyethylene glycol (GLYCOLAX) 17 GM/SCOOP powder Mix 17 grams in 4-8 oz of liquide and drink by mouth daily as needed (Constipation).    pravastatin (PRAVACHOL) 40 MG tablet Take 1 tablet (40 mg) by mouth every evening.    senna (SENOKOT) 8.6 MG tablet Take 1 tablet (8.6 mg) by mouth daily.    sirolimus (RAPAMUNE) 1 MG tablet Take 2 tablets (2 mg) by mouth every morning.    SYRINGE-NEEDLE, DISP, 3 ML (B-D 3CC LUER-LOK SYR 25GX1") 25G X 1" 3 ML MISC Use as directed to inject testosterone    SYRINGE-NEEDLE, DISP, 3 ML 18G X 1-1/2" 3 ML MISC Use to draw up testosterone    tacrolimus (ENVARSUS XR) 1 MG tablet STOP TAKING since 09/17/21 - remaining on chart for dose adjustments, titratable med.    tacrolimus (ENVARSUS XR) 4 MG tablet Take 1 tablet (4 mg) by mouth every morning.    tamsulosin (FLOMAX) 0.4 MG capsule Take 1 capsule (0.4 mg) by mouth daily.    tamsulosin (FLOMAX) 0.4 MG capsule Take 1 capsule (0.4 mg) by mouth daily.    testosterone cypionate (DEPO-TESTOSTERONE) 200 MG/ML SOLN Inject 1 ml into the muscle every 14 days    traZODone (DESYREL) 50 MG tablet Take 1 tablet (50 mg) by mouth nightly.     Current Facility-Administered Medications   Medication    diphenhydrAMINE (BENADRYL) injection 50 mg    diphenhydrAMINE (BENADRYL) tablet 50 mg     Immunization History   Administered Date(s) Administered    COVID-19 (Moderna) Low Dose Red Cap >= 18 Years 02/16/2020     COVID-19 (Moderna) Red Cap >= 12 Years 03/25/2019, 04/24/2019, 08/23/2019    Hep-A/Hep-B; Twinrix, Adult 03/18/2020    Influenza Vaccine (High Dose) Quadrivalent >=65 Years 11/08/2019    Influenza Vaccine (Unspecified) 09/05/2016    Influenza Vaccine >=6 Months 11/18/2009, 11/18/2010, 01/14/2012, 10/11/2013, 09/24/2017, 10/10/2018    Pneumococcal 13 Vaccine (PREVNAR-13) 11/08/2019    Pneumococcal 23 Vaccine (PNEUMOVAX-23) 11/18/2012, 03/18/2020    Tdap 01/06/2011   Deferred Date(s) Deferred    Pneumococcal 23 Vaccine (PNEUMOVAX-23) 03/09/2019     Physical Exam:  BP 102/69 (BP Location: Right arm, BP Patient Position: Sitting, BP cuff size: Large)   Pulse 98   Temp 98.5 F (36.9 C) (Temporal)   Resp 16   Ht 5' 10" (1.778 m)   Wt 96.2 kg (212 lb)   SpO2 97%   BMI 30.42 kg/m      General Appearance: ***alert, no distress, pleasant affect, cooperative.  Heart:  JVD ***, PMI ***, normal rate and regular rhythm, no murmurs, clicks, or gallops. ***  Lungs: ***clear to auscultation and percussion. No rales, rhonchi, or wheezes noted. No chest deformities noted.  Abdomen: ***BS normal.  Abdomen soft, non-tender.  No masses or organomegaly.  Extremities:  ***no cyanosis, clubbing, or edema. Has 2+ peripheral pulses.        Lab Data:  Lab Results   Component Value Date    BUN 26 (H) 09/17/2021    CREAT 1.98 (H) 09/17/2021    CL 99 09/17/2021    NA 140 09/17/2021    K 4.4 09/17/2021    CA 9.2 09/17/2021    TBILI 0.47 09/17/2021    ALB 4.1 09/17/2021    TP 7.1 09/17/2021    AST 22 09/17/2021    ALK 76 09/17/2021    BICARB 29 09/17/2021    ALT 25 09/17/2021    GLU 126 (H) 09/17/2021     Lab Results   Component Value Date    WBC 7.9 09/17/2021    RBC 5.50 09/17/2021    HGB 15.2 09/17/2021    HCT 46.5 09/17/2021    MCV 84.5 09/17/2021    MCHC 32.7 09/17/2021    RDW 12.3 09/17/2021    PLT 162 09/17/2021    MPV 11.6 09/17/2021     Lab Results   Component Value Date    A1C 5.7 02/14/2021     Lab Results   Component Value Date     TSH 1.63 02/14/2021     Lab Results   Component Value Date    CHOL 105 02/14/2021    HDL 38 02/14/2021    LDLCALC 43 02/14/2021    TRIG 121 02/14/2021     Lab Results   Component Value Date    SIROT 11.5 09/17/2021     Lab Results   Component Value Date    FKTR 6.5 09/17/2021     No results found for: CSATR  Lab Results   Component Value Date    CMVPL Not Detected 12/06/2020     Lab Results   Component Value Date    DSA ABSENT 09/17/2021       Prior Cardiovascular Studies:   Lab Results   Component Value Date    LV Ejection Fraction 59 03/13/2021          Echo 03/13/21  Summary:   1. The left ventricular size is normal. The left ventricular systolic function is normal.   2. No left ventricular hypertrophy.   3. Normal pattern of left ventricular diastolic filling.   4. EF=59%.   5. Compared to prior study EF now 59%, was 69% 04/05/20.     LHC/IVUS 03/11/21  CONCLUSION:                                                                   1. Myocardial bridging with mild systolic compression of the mid segment    of the left anterior descending coronary artery.                              2. No angiographic evidence of coronary artery disease.                      3. Intimal thickness noted in LAD/LM up to 0.5 mm (Stable to slightly       worse compare to 2022).                                                         4. Non significant FFR at apical LAD.                                        5. Left ventricular end diastolic pressure appears normal.        Assessment summary:  53 year old male with end-stage HFrEF 2/2 NICM s/p OHT 02/24/19, history of 2R, HTN, HLD and anxiety coming in for f/u of heart transplant.    Assessment/Plan:  # Hematuria  # Dysuria  # Chronic pain  Assessment: We had a long frank discussion about patient's chronic pain issues and the heart transplant team's role in this. I discussed with him that when I initially agreed to cover his chronic opiate prescription, this was the assumption that he would  have a provider versed in chronic pain after 3-4 months, but we are at 6 months and has unable to find one. Additionally, I had not put him on a pain contract at that time, but he recently used more opiates without asking and I informed him this was not appropriate, but because he had not established guidelines I was not going to stop at this time. However, going forward until he can establish with a pain physician, we will set up a pain contract and he will need to follow through like a usual pain clinic with us with goal of provider in 3-4 months or I may start tapering. I will augment adjuvant agents additionally for now and we can continue to work on this.  Plan:  -pain contract signed  -urine tox monthly  -clinic follow up month  -oxycodone 10 mg tablets PO, 1 tab every 4 hours moderate pain, 2 tabs every 4 hours for severe pain, no more than 10 tablets a day, total 280 per 28 days.   -diclofenac cream for joint pain  -lidocaine patch for back pain  -trial of pyridium  -increase gaba at night  -siro change as below  -cymbalta as below    # End-stage heart failure s/p orthotopic heart transplant  # Chronic Immunosuppression/Immunomodulation  Assessment: While we thought continuing sirolimus would help prevent recurrent scar tissue from prostate procedure, it may be exacerbating factors now with delayed wound healing. Will try mmf for 1 month.  Plan:   - continue envarsus 6 mg daily, goal trough 4-8  - HOLD sirolimus 3 mg daily, goal trough 4-8 for at least 1 month  - start mmf 1000 mg bid for one month to allow healing  - Continue to monitor for renal toxicities, infection risk and malignancy risk  - continue pravastatin 40 mg daily  - continue aspirin 81 mg daily    # Hypertension  Assessment: controlled  Plan:  -continue lisinopril 20 mg daily  -resume hctz  -nifedipine 30 mg daily    # Dyslipidemia  -continue pravastatin 40 mg daily    # Depression  Assessment: improved mood  Plan:  -increase cymbalta to 120  mg daily     RTC in 1 month       Nicholas W Wettersten, MD  Advanced Heart Failure, Mechanical Circulatory Support, Transplant  Pgr: [phone]

## 2018-02-23 NOTE — Telephone Encounter (Signed)
Pharmacy is requesting 90 day supply per patient's insurance. Patient last seen 11/03/17.

## 2018-02-25 ENCOUNTER — Telehealth: Payer: Self-pay | Admitting: Family Medicine

## 2018-02-25 ENCOUNTER — Other Ambulatory Visit: Payer: Self-pay

## 2018-02-25 ENCOUNTER — Encounter: Payer: Self-pay | Admitting: Physical Therapy

## 2018-02-25 ENCOUNTER — Ambulatory Visit: Payer: Self-pay | Admitting: Physical Therapy

## 2018-02-25 ENCOUNTER — Ambulatory Visit: Payer: Managed Care, Other (non HMO) | Attending: Neurology | Admitting: Physical Therapy

## 2018-02-25 DIAGNOSIS — M62838 Other muscle spasm: Secondary | ICD-10-CM

## 2018-02-25 DIAGNOSIS — R293 Abnormal posture: Secondary | ICD-10-CM | POA: Diagnosis present

## 2018-02-25 DIAGNOSIS — M6281 Muscle weakness (generalized): Secondary | ICD-10-CM | POA: Diagnosis present

## 2018-02-25 DIAGNOSIS — M542 Cervicalgia: Secondary | ICD-10-CM

## 2018-02-25 NOTE — Telephone Encounter (Signed)
Called patient advised of what Apolonio Schneiders mentioned he will come get sample
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
## 2018-02-25 NOTE — Telephone Encounter (Signed)
Please call pt and let him know we have one tresiba sample if he would like to come get it while we figure out what to do with his medicine. It's not the exact medicine he's on but it's at least the insulin portion. Will route to PCP as well for decision regarding what to have him on long term  Copied from [place] #[id number]. Topic: General - Other >> Feb 25, 2018  9:55 AM Yvette Rack wrote: Reason for CRM: Pt stated he is completely out of insulin and his insurance still is not approving the 90 day supply. Pt stated his pharmacy informed him that his insurance is requesting that his doctor contact them regarding another medication or to discuss details of the current medication. Cb# [phone]

## 2018-02-28 NOTE — Telephone Encounter (Signed)
Can we find out if they just need [place] to call in a 30 day supply or if there is a PA that needs to be done so he can get his medicine?

## 2018-02-28 NOTE — Telephone Encounter (Signed)
Called and left a voicemail for patient letting him know.

## 2018-02-28 NOTE — Telephone Encounter (Signed)
PA submitted and approved.

## 2018-03-07 ENCOUNTER — Ambulatory Visit: Payer: Managed Care, Other (non HMO) | Attending: Neurology | Admitting: Physical Therapy

## 2018-03-07 ENCOUNTER — Encounter: Payer: Self-pay | Admitting: Physical Therapy

## 2018-03-07 DIAGNOSIS — M542 Cervicalgia: Secondary | ICD-10-CM | POA: Diagnosis present

## 2018-03-07 DIAGNOSIS — M62838 Other muscle spasm: Secondary | ICD-10-CM | POA: Diagnosis present

## 2018-03-07 DIAGNOSIS — R293 Abnormal posture: Secondary | ICD-10-CM | POA: Insufficient documentation

## 2018-03-07 DIAGNOSIS — M6281 Muscle weakness (generalized): Secondary | ICD-10-CM | POA: Diagnosis present

## 2018-03-07 NOTE — Therapy (Signed)
[place], [place], [address] Phone: [phone]   Fax:  [phone]  Physical Therapy Treatment  Patient Details  Name: Arthur [name] MRN: [id number] Date of Birth: [birth year] Referring Provider (PT):  Melvenia Beam, MD   Encounter Date: 03/07/2018  PT End of Session - 03/07/18 1407    Visit Number  2    Number of Visits  13    Date for PT Re-Evaluation  04/08/18    PT Start Time  7858    PT Stop Time  1457    PT Time Calculation (min)  50 min    Activity Tolerance  Patient tolerated treatment well    Behavior During Therapy  [facility] for tasks assessed/performed       Past Medical History:  Diagnosis Date  . Diabetes mellitus with renal complications ([place])   . ED (erectile dysfunction)   . Erectile dysfunction associated with type 2 diabetes mellitus ([place])   . Exotropia   . Hyperlipidemia   . Hypertension   . Thoracic back pain   . Type II diabetes mellitus with renal manifestations, uncontrolled ([place]) 06/12/2014    Past Surgical History:  Procedure Laterality Date  . HIP SURGERY    . SHOULDER SURGERY Left    bone spurs removed    There were no vitals filed for this visit.  Subjective Assessment - 03/07/18 1408    Subjective  "I've been doing the exercise, I am still having about the same about of pain"     Patient Stated Goals  to be pain free, to be able to standing/ read for longer periods of time.  spend more time with family.     Currently in Pain?  Yes    Pain Score  6     Pain Orientation  Right    Pain Descriptors / Indicators  Aching;Sore    Pain Type  Chronic pain    Pain Frequency  Constant    Aggravating Factors   prolonged standing, prolonged     Pain Relieving Factors  medciation                       [facility] Adult PT Treatment/Exercise - 03/07/18 0001      Neck Exercises: Seated   Neck Retraction  10 reps;5 secs   verbal cues for proper form   Other  Seated Exercise  upper cervical rotation 2 x 10 using 4 fingers on chest      Modalities   Modalities  Moist Heat      Moist Heat Therapy   Number Minutes Moist Heat  10 Minutes    Moist Heat Location  Cervical   in supine     Manual Therapy   Manual Therapy  Joint mobilization;Taping;Other (comment);Soft tissue mobilization    Manual therapy comments  skilled palpation and monitoring of pt throughout TPDN    Joint Mobilization  C3-C7, T1-T6 PA grade III    Soft tissue mobilization  IASTM along the R upper trap/ sub-occipitals    Other Manual Therapy  R upper trap inhibition taping      Neck Exercises: Stretches   Upper Trapezius Stretch  Right;2 reps;30 seconds    Levator Stretch  1 rep;Right;30 seconds       Trigger Point Dry Needling - 03/07/18 0001    Consent Given?  Yes    Education Handout Provided  Yes    Muscles Treated Head and Neck  Suboccipitals;Upper trapezius    Other Dry Needling  sub-occipitals    Upper Trapezius Response  Twitch reponse elicited;Palpable increased muscle length           PT Education - 03/07/18 1423    Education Details  muscle anatomy and referral patterns. What TPDN is, benefits and what to expect. reviewed previously provided HEP. began discussing pain education with tissues heal.     Person(s) Educated  Patient    Methods  Explanation;Verbal cues;Handout    Comprehension  Verbalized understanding;Verbal cues required       PT Short Term Goals - 02/25/18 0958      PT SHORT TERM GOAL #1   Title  pt to be I with inital HEP    Time  3    Period  Weeks    Status  New    Target Date  03/18/18      PT SHORT TERM GOAL #2   Title  pt to demo / verbalize proper posture and lifting mechanics to reduce and prevent neck pain/ spasm    Time  3    Period  Weeks    Status  New    Target Date  03/18/18      PT SHORT TERM GOAL #3   Title  reduce upper trap/ levator scapulae and surrounding musculature to decrease pain to </= 4/10 and  promote cervical ROM.     Time  3    Period  Weeks    Status  New    Target Date  03/18/18        PT Long Term Goals - 02/25/18 1037      PT LONG TERM GOAL #1   Title  pt to increase cervical moblity by >/= 10 degrees in all planes to promote functional ROM for ADLs     Time  6    Period  Weeks    Status  New    Target Date  04/08/18      PT LONG TERM GOAL #2   Title  pt to be able to sit/ stand or walk >/= 1 hour with </= 1/10 pain for functional endurance for potential work related activities    Time  6    Period  Weeks    Status  New    Target Date  04/08/18      PT LONG TERM GOAL #3   Title  increase FOTO score to </= 42% limited to demo improvement in function     Time  6    Period  Weeks    Status  New    Target Date  04/08/18      PT LONG TERM GOAL #4   Title  pt to be I with all HEP given as of last visit to maintain and progress current level of function     Time  6    Period  Weeks    Status  New    Target Date  04/08/18            Plan - 03/07/18 1453    Clinical Impression Statement  pt reports consistency with his HEP but notes simimlar symptoms in regarding stiffness/ pain. educated and consent was given for TPDN focusing on the R upper trap and sub-occipitals followed cervicothoracic mobs and IASTM Techniques. trialed inhibition taping and continued stretching. reviewed HEp and benefits of consistency.     PT Treatment/Interventions  ADLs/Self Care Home Management;Cryotherapy;Electrical Stimulation;Iontophoresis 4mg /ml Dexamethasone;Moist Heat;Traction;Therapeutic exercise;Therapeutic activities;Dry needling;Taping;Manual  techniques;Neuromuscular re-education;Patient/family education;Passive range of motion    PT Next Visit Plan  response to DN, , assess shoulder mobility/ strength, DN for upper trap/ levator scapuale, upper cervical mobility    PT Home Exercise Plan  chin tuck, upper trap stretch, levator scapulae stretching, Scapular retraction     Consulted and Agree with Plan of Care  Patient       Patient will benefit from skilled therapeutic intervention in order to improve the following deficits and impairments:  Pain, Impaired UE functional use, Increased fascial restricitons, Decreased strength, Postural dysfunction, Improper body mechanics, Decreased range of motion  Visit Diagnosis: Cervicalgia  Other muscle spasm  Muscle weakness (generalized)  Abnormal posture     Problem List Patient Active Problem List   Diagnosis Date Noted  . Chronic pain of left knee 12/07/2014  . Benign prostatic hyperplasia 09/05/2014  . Benign hypertensive renal disease   . Type II diabetes mellitus with renal manifestations, uncontrolled ([place]) 06/12/2014  . Hyperlipidemia 06/12/2014  . Vasculogenic erectile dysfunction 06/12/2014  . Exotropia, monocular 06/12/2014  . Erectile dysfunction associated with type 2 diabetes mellitus ([place])    Starr Lake PT, DPT, LAT, ATC  03/07/18  2:58 PM      [facility] [address] [place], [place], [address] Phone: [phone]   Fax:  [phone]  Name: Arthur [name] MRN: [id number] Date of Birth: Dec 09, 1965

## 2018-03-09 ENCOUNTER — Encounter: Payer: Self-pay | Admitting: Physical Therapy

## 2018-03-09 ENCOUNTER — Ambulatory Visit: Payer: Managed Care, Other (non HMO) | Admitting: Physical Therapy

## 2018-03-09 DIAGNOSIS — M62838 Other muscle spasm: Secondary | ICD-10-CM

## 2018-03-09 DIAGNOSIS — M542 Cervicalgia: Secondary | ICD-10-CM

## 2018-03-09 DIAGNOSIS — M6281 Muscle weakness (generalized): Secondary | ICD-10-CM

## 2018-03-09 DIAGNOSIS — R293 Abnormal posture: Secondary | ICD-10-CM

## 2018-03-09 NOTE — Therapy (Signed)
[place], [place], [address] Phone: [phone]   Fax:  [phone]  Physical Therapy Treatment  Patient Details  Name: Arthur [name] MRN: [id number] Date of Birth: 16-May-1965 Referring Provider (PT):  Melvenia Beam, MD   Encounter Date: 03/09/2018  PT End of Session - 03/09/18 1420    Visit Number  3    Number of Visits  13    Date for PT Re-Evaluation  04/08/18    PT Start Time  3154    PT Stop Time  1504    PT Time Calculation (min)  46 min    Activity Tolerance  Patient tolerated treatment well    Behavior During Therapy  [facility] for tasks assessed/performed       Past Medical History:  Diagnosis Date  . Diabetes mellitus with renal complications ([place])   . ED (erectile dysfunction)   . Erectile dysfunction associated with type 2 diabetes mellitus ([place])   . Exotropia   . Hyperlipidemia   . Hypertension   . Thoracic back pain   . Type II diabetes mellitus with renal manifestations, uncontrolled ([place]) 06/12/2014    Past Surgical History:  Procedure Laterality Date  . HIP SURGERY    . SHOULDER SURGERY Left    bone spurs removed    There were no vitals filed for this visit.  Subjective Assessment - 03/09/18 1420    Subjective  "the jury is out if the DN was helpfull, I am still alittle sore"     Currently in Pain?  Yes    Pain Score  5     Pain Location  Neck    Pain Orientation  Right    Pain Descriptors / Indicators  Aching;Sore    Pain Type  Chronic pain    Pain Onset  More than a month ago    Pain Frequency  Intermittent                       [facility] Adult PT Treatment/Exercise - 03/09/18 0001      Neck Exercises: Seated   Neck Retraction  10 reps;5 secs    Other Seated Exercise  upper cervical rotation 2 x 10 using 4 fingers on chest   cues for slow continous movement , and avoid hiking shoulder     Modalities   Modalities  Electrical Stimulation      Moist  Heat Therapy   Number Minutes Moist Heat  10 Minutes    Moist Heat Location  Cervical   and upper thoracic in supine     Electrical Stimulation   Electrical Stimulation Location  R posterior shoulder    Electrical Stimulation Action  IFC    Electrical Stimulation Parameters  L9 x 30min @ 100% scan    Electrical Stimulation Goals  Pain      Manual Therapy   Manual therapy comments  skilled palpation and monitoring of pt throughout TPDN    Joint Mobilization  C3-C7, T1-T6 PA grade III    Soft tissue mobilization  IASTM along the R upper trap/ sub-occipitals      Neck Exercises: Stretches   Upper Trapezius Stretch  2 reps;30 seconds       Trigger Point Dry Needling - 03/09/18 0001    Consent Given?  Yes    Education Handout Provided  Previously provided    Muscles Treated Head and Neck  Suboccipitals;Upper trapezius    Muscles Treated Upper  Quadrant  Infraspinatus;Teres minor    Other Dry Needling  sub-occipitals    Upper Trapezius Response  Twitch reponse elicited;Palpable increased muscle length    Infraspinatus Response  Twitch response elicited;Palpable increased muscle length    Teres minor Response  Twitch response elicited;Palpable increased muscle length           PT Education - 03/09/18 1456    Education Details  benefits of E-stim for pain modualtion, and benefits of continued stretching    Person(s) Educated  Patient    Methods  Explanation;Verbal cues    Comprehension  Verbalized understanding;Verbal cues required       PT Short Term Goals - 02/25/18 0958      PT SHORT TERM GOAL #1   Title  pt to be I with inital HEP    Time  3    Period  Weeks    Status  New    Target Date  03/18/18      PT SHORT TERM GOAL #2   Title  pt to demo / verbalize proper posture and lifting mechanics to reduce and prevent neck pain/ spasm    Time  3    Period  Weeks    Status  New    Target Date  03/18/18      PT SHORT TERM GOAL #3   Title  reduce upper trap/ levator  scapulae and surrounding musculature to decrease pain to </= 4/10 and promote cervical ROM.     Time  3    Period  Weeks    Status  New    Target Date  03/18/18        PT Long Term Goals - 02/25/18 1037      PT LONG TERM GOAL #1   Title  pt to increase cervical moblity by >/= 10 degrees in all planes to promote functional ROM for ADLs     Time  6    Period  Weeks    Status  New    Target Date  04/08/18      PT LONG TERM GOAL #2   Title  pt to be able to sit/ stand or walk >/= 1 hour with </= 1/10 pain for functional endurance for potential work related activities    Time  6    Period  Weeks    Status  New    Target Date  04/08/18      PT LONG TERM GOAL #3   Title  increase FOTO score to </= 42% limited to demo improvement in function     Time  6    Period  Weeks    Status  New    Target Date  04/08/18      PT LONG TERM GOAL #4   Title  pt to be I with all HEP given as of last visit to maintain and progress current level of function     Time  6    Period  Weeks    Status  New    Target Date  04/08/18            Plan - 03/09/18 1457    Clinical Impression Statement  continued TPDn focusing on R upper trap/ infraspinatus and teres minor followed with IASTM techiniques and cervicothoracic mobs. continued gentle stretching and ROM activities. utilized E-stim and MHP end of session to calm down pain/ soreness.     PT Treatment/Interventions  ADLs/Self Care Home Management;Cryotherapy;Electrical Stimulation;Iontophoresis 4mg /ml Dexamethasone;Moist Heat;Traction;Therapeutic exercise;Therapeutic  activities;Dry needling;Taping;Manual techniques;Neuromuscular re-education;Patient/family education;Passive range of motion    PT Next Visit Plan  response to DN, , assess shoulder mobility/ strength, DN for upper trap/ levator scapuale, upper cervical mobility    PT Home Exercise Plan  chin tuck, upper trap stretch, levator scapulae stretching, Scapular retraction    Consulted and  Agree with Plan of Care  Patient       Patient will benefit from skilled therapeutic intervention in order to improve the following deficits and impairments:  Pain, Impaired UE functional use, Increased fascial restricitons, Decreased strength, Postural dysfunction, Improper body mechanics, Decreased range of motion  Visit Diagnosis: Cervicalgia  Other muscle spasm  Muscle weakness (generalized)  Abnormal posture     Problem List Patient Active Problem List   Diagnosis Date Noted  . Chronic pain of left knee 12/07/2014  . Benign prostatic hyperplasia 09/05/2014  . Benign hypertensive renal disease   . Type II diabetes mellitus with renal manifestations, uncontrolled ([place]) 06/12/2014  . Hyperlipidemia 06/12/2014  . Vasculogenic erectile dysfunction 06/12/2014  . Exotropia, monocular 06/12/2014  . Erectile dysfunction associated with type 2 diabetes mellitus ([place])    Starr Lake PT, DPT, LAT, ATC  03/09/18  2:59 PM      [place] [address] [place], [place], [address] Phone: [phone]   Fax:  [phone]  Name: CACHE BILLS MRN: [id number] Date of Birth: 04-06-1965

## 2018-03-14 ENCOUNTER — Encounter: Payer: Self-pay | Admitting: Physical Therapy

## 2018-03-14 ENCOUNTER — Ambulatory Visit: Payer: Managed Care, Other (non HMO) | Admitting: Physical Therapy

## 2018-03-14 DIAGNOSIS — M542 Cervicalgia: Secondary | ICD-10-CM

## 2018-03-14 DIAGNOSIS — M62838 Other muscle spasm: Secondary | ICD-10-CM

## 2018-03-14 DIAGNOSIS — R293 Abnormal posture: Secondary | ICD-10-CM

## 2018-03-14 DIAGNOSIS — M6281 Muscle weakness (generalized): Secondary | ICD-10-CM

## 2018-03-14 NOTE — Therapy (Signed)
[place], [place], [address] Phone: [phone]   Fax:  [phone]  Physical Therapy Treatment  Patient Details  Name: Arthur [name] MRN: [id number] Date of Birth: 1965/05/01 Referring Provider (PT):  Melvenia Beam, MD   Encounter Date: 03/14/2018  PT End of Session - 03/14/18 1502    Visit Number  4    Number of Visits  13    Date for PT Re-Evaluation  04/08/18    PT Start Time  0762    PT Stop Time  1520   time spent dry needling and for heat, estim not included in direct minutes   PT Time Calculation (min)  63 min    Activity Tolerance  Patient limited by pain    Behavior During Therapy  [facility] for tasks assessed/performed       Past Medical History:  Diagnosis Date  . Diabetes mellitus with renal complications ([place])   . ED (erectile dysfunction)   . Erectile dysfunction associated with type 2 diabetes mellitus ([place])   . Exotropia   . Hyperlipidemia   . Hypertension   . Thoracic back pain   . Type II diabetes mellitus with renal manifestations, uncontrolled ([place]) 06/12/2014    Past Surgical History:  Procedure Laterality Date  . HIP SURGERY    . SHOULDER SURGERY Left    bone spurs removed    There were no vitals filed for this visit.  Subjective Assessment - 03/14/18 1417    Subjective  "Rough weekend" with pain including occipital region headache. Moderate pain this PM neck and right upper trap, posterior scapular region.    Patient Stated Goals  to be pain free, to be able to standing/ read for longer periods of time.  spend more time with family.          [facility] PT Assessment - 03/14/18 0001      ROM / Strength   AROM / PROM / Strength  Strength      AROM   Right Shoulder Flexion  80 Degrees    Right Shoulder ABduction  80 Degrees    Right Shoulder Internal Rotation  --   reach to hip crest   Right Shoulder External Rotation  40 Degrees   at 0 deg abd, difficulty reaching  neck with Rt. UE reach                  [facility] Adult PT Treatment/Exercise - 03/14/18 0001      Neck Exercises: Supine   Neck Retraction  20 reps      Moist Heat Therapy   Number Minutes Moist Heat  10 Minutes    Moist Heat Location  Cervical      Manual Therapy   Manual therapy comments  skilled palpation of trigger points    Joint Mobilization  C5-7 lateral glides, thoracic PAs attempted but held due to soreness    Soft tissue mobilization  STM right rhomboids and upper trap region    Other Manual Therapy  gentle suboccipital release and manual traction      Neck Exercises: Stretches   Upper Trapezius Stretch  Right;3 reps;20 seconds   supine manual stretch   Levator Stretch  Right;3 reps;20 seconds   supine manual stretch      Trigger Point Dry Needling - 03/14/18 0001    Consent Given?  Yes    Muscles Treated Head and Neck  Upper trapezius;Oblique capitus;Suboccipitals;Levator scapulae;Cervical multifidi  Dry Needling Comments  in prone, multifidi C5-7 on right, suboccipitals include OCI, 32 gauge needles used, 30 mm length excepting C5-6 multifidi with 50 mm needles (partially inserted)    Electrical Stimulation Performed with Dry Needling  Yes    E-stim with Dry Needling Details  TENS 100 pps x 10 min to right upper trapezius and levator           PT Education - 03/14/18 1502    Education Details  symptom etiology with mechanism of injury    Person(s) Educated  Patient    Methods  Explanation    Comprehension  Verbalized understanding       PT Short Term Goals - 03/14/18 1512      PT SHORT TERM GOAL #1   Title  pt to be I with inital HEP    Baseline  met for initial HEP, will update prn    Time  3    Period  Weeks    Status  New    Target Date  03/18/18      PT SHORT TERM GOAL #2   Title  pt to demo / verbalize proper posture and lifting mechanics to reduce and prevent neck pain/ spasm    Baseline  tendency forward head, limited ability to  for lifting due to pain, spasm    Time  3    Period  Weeks    Status  On-going      PT SHORT TERM GOAL #3   Title  reduce upper trap/ levator scapulae and surrounding musculature to decrease pain to </= 4/10 and promote cervical ROM.     Baseline  6/10 pain, limited ROM with spasm    Time  3    Period  Weeks    Status  On-going    Target Date  03/18/18        PT Long Term Goals - 02/25/18 1037      PT LONG TERM GOAL #1   Title  pt to increase cervical moblity by >/= 10 degrees in all planes to promote functional ROM for ADLs     Time  6    Period  Weeks    Status  New    Target Date  04/08/18      PT LONG TERM GOAL #2   Title  pt to be able to sit/ stand or walk >/= 1 hour with </= 1/10 pain for functional endurance for potential work related activities    Time  6    Period  Weeks    Status  New    Target Date  04/08/18      PT LONG TERM GOAL #3   Title  increase FOTO score to </= 42% limited to demo improvement in function     Time  6    Period  Weeks    Status  New    Target Date  04/08/18      PT LONG TERM GOAL #4   Title  pt to be I with all HEP given as of last visit to maintain and progress current level of function     Time  6    Period  Weeks    Status  New    Target Date  04/08/18            Plan - 03/14/18 1504    Clinical Impression Statement  Fair status with continued pain-checked right shoulder motion but (ROM) was grossly limited due pain and  limited ability to check strength due to discomfort. Still with diffuse pain, likely underlying myofascial etiology.     Stability/Clinical Decision Making  Evolving/Moderate complexity    Rehab Potential  Good    PT Frequency  2x / week    PT Duration  6 weeks    PT Next Visit Plan  response to DN with estim and gentle traction, DN for upper trap/ levator scapuale, upper cervical mobility, STM    PT Home Exercise Plan  chin tuck, upper trap stretch, levator scapulae stretching, Scapular retraction     Consulted and Agree with Plan of Care  Patient       Patient will benefit from skilled therapeutic intervention in order to improve the following deficits and impairments:  Pain, Impaired UE functional use, Increased fascial restricitons, Decreased strength, Postural dysfunction, Improper body mechanics, Decreased range of motion  Visit Diagnosis: Cervicalgia  Other muscle spasm  Muscle weakness (generalized)  Abnormal posture     Problem List Patient Active Problem List   Diagnosis Date Noted  . Chronic pain of left knee 12/07/2014  . Benign prostatic hyperplasia 09/05/2014  . Benign hypertensive renal disease   . Type II diabetes mellitus with renal manifestations, uncontrolled ([place]) 06/12/2014  . Hyperlipidemia 06/12/2014  . Vasculogenic erectile dysfunction 06/12/2014  . Exotropia, monocular 06/12/2014  . Erectile dysfunction associated with type 2 diabetes mellitus [facility])     Beaulah Dinning, PT, DPT 03/14/18 3:15 PM  [facility] [address] [place], [place], [address] Phone: [phone]   Fax:  [phone]  Name: Arthur [name] MRN: [id number] Date of Birth: 1965/12/04

## 2018-03-15 ENCOUNTER — Encounter: Payer: Self-pay | Admitting: Physical Therapy

## 2018-03-17 ENCOUNTER — Ambulatory Visit: Payer: Managed Care, Other (non HMO) | Admitting: Physical Therapy

## 2018-03-22 ENCOUNTER — Ambulatory Visit: Payer: Managed Care, Other (non HMO) | Admitting: Physical Therapy

## 2018-03-22 ENCOUNTER — Encounter: Payer: Self-pay | Admitting: Physical Therapy

## 2018-03-22 ENCOUNTER — Other Ambulatory Visit: Payer: Self-pay

## 2018-03-22 DIAGNOSIS — R293 Abnormal posture: Secondary | ICD-10-CM

## 2018-03-22 DIAGNOSIS — M62838 Other muscle spasm: Secondary | ICD-10-CM

## 2018-03-22 DIAGNOSIS — M6281 Muscle weakness (generalized): Secondary | ICD-10-CM

## 2018-03-22 DIAGNOSIS — M542 Cervicalgia: Secondary | ICD-10-CM

## 2018-03-22 NOTE — Therapy (Signed)
[place], [place], [address] Phone: [phone]   Fax:  [phone]  Physical Therapy Treatment  Patient Details  Name: Arthur [name] MRN: [id number] Date of Birth: 12-07-1965 Referring Provider (PT):  Melvenia Beam, MD   Encounter Date: 03/22/2018  PT End of Session - 03/22/18 1503    Visit Number  5    Number of Visits  13    Date for PT Re-Evaluation  04/08/18    PT Start Time  1501    PT Stop Time  1555    PT Time Calculation (min)  54 min    Activity Tolerance  Patient limited by pain    Behavior During Therapy  [facility] for tasks assessed/performed       Past Medical History:  Diagnosis Date  . Diabetes mellitus with renal complications ([place])   . ED (erectile dysfunction)   . Erectile dysfunction associated with type 2 diabetes mellitus ([place])   . Exotropia   . Hyperlipidemia   . Hypertension   . Thoracic back pain   . Type II diabetes mellitus with renal manifestations, uncontrolled ([place]) 06/12/2014    Past Surgical History:  Procedure Laterality Date  . HIP SURGERY    . SHOULDER SURGERY Left    bone spurs removed    There were no vitals filed for this visit.  Subjective Assessment - 03/22/18 1504    Subjective  "the Madaline [place] is still out I am not sure if I am making any difference or not"    Currently in Pain?  Yes    Pain Score  4     Pain Location  Neck    Pain Orientation  Right    Pain Type  Chronic pain    Aggravating Factors   laying down for long period of time                       [facility] Adult PT Treatment/Exercise - 03/22/18 0001      Self-Care   Self-Care  Posture    Posture  proper sitting / laying down posture using bolster under the knees and proper arms up with pillows and keeping the head in good positioning.       Moist Heat Therapy   Number Minutes Moist Heat  10 Minutes    Moist Heat Location  Cervical      Manual Therapy   Joint  Mobilization  C5-7 lateral glides, thoracic PAs attempted but held due to soreness    Soft tissue mobilization  STM right rhomboids and upper trap region    Other Manual Therapy  R upper trap inhibition taping      Neck Exercises: Stretches   Upper Trapezius Stretch  2 reps;30 seconds    Levator Stretch  2 reps;30 seconds       Trigger Point Dry Needling - 03/22/18 0001    Consent Given?  Yes    Education Handout Provided  Previously provided    E-stim with Dry Needling Details  TENS 100 pps x 10 min to right upper trapezius and levator    Other Dry Needling  sub-occipitals    Upper Trapezius Response  Twitch reponse elicited;Palpable increased muscle length           PT Education - 03/22/18 1549    Education Details  proper posture and positioning in laying to promote reduced muscle tension and spasm.     Person(s) Educated  Patient    Methods  Explanation;Verbal cues;Handout    Comprehension  Verbalized understanding;Verbal cues required       PT Short Term Goals - 03/14/18 1512      PT SHORT TERM GOAL #1   Title  pt to be I with inital HEP    Baseline  met for initial HEP, will update prn    Time  3    Period  Weeks    Status  New    Target Date  03/18/18      PT SHORT TERM GOAL #2   Title  pt to demo / verbalize proper posture and lifting mechanics to reduce and prevent neck pain/ spasm    Baseline  tendency forward head, limited ability to for lifting due to pain, spasm    Time  3    Period  Weeks    Status  On-going      PT SHORT TERM GOAL #3   Title  reduce upper trap/ levator scapulae and surrounding musculature to decrease pain to </= 4/10 and promote cervical ROM.     Baseline  6/10 pain, limited ROM with spasm    Time  3    Period  Weeks    Status  On-going    Target Date  03/18/18        PT Long Term Goals - 02/25/18 1037      PT LONG TERM GOAL #1   Title  pt to increase cervical moblity by >/= 10 degrees in all planes to promote functional  ROM for ADLs     Time  6    Period  Weeks    Status  New    Target Date  04/08/18      PT LONG TERM GOAL #2   Title  pt to be able to sit/ stand or walk >/= 1 hour with </= 1/10 pain for functional endurance for potential work related activities    Time  6    Period  Weeks    Status  New    Target Date  04/08/18      PT LONG TERM GOAL #3   Title  increase FOTO score to </= 42% limited to demo improvement in function     Time  6    Period  Weeks    Status  New    Target Date  04/08/18      PT LONG TERM GOAL #4   Title  pt to be I with all HEP given as of last visit to maintain and progress current level of function     Time  6    Period  Weeks    Status  New    Target Date  04/08/18            Plan - 03/22/18 1550    Clinical Impression Statement  pt reports 4/10 pain but continues to report feeling unsure if PT is benefitting. Continued TPDN followed with cervicothoracic mobs. focused on posturing insitting with emphasis on sleeping positioning. continued MHP end of session to calm down muscle soreness.    PT Treatment/Interventions  ADLs/Self Care Home Management;Cryotherapy;Electrical Stimulation;Iontophoresis '4mg'$ /ml Dexamethasone;Moist Heat;Traction;Therapeutic exercise;Therapeutic activities;Dry needling;Taping;Manual techniques;Neuromuscular re-education;Patient/family education;Passive range of motion    PT Next Visit Plan  response to DN with estim and gentle traction, DN for upper trap/ levator scapuale, upper cervical mobility, STM assess cervical ROM     PT Home Exercise Plan  chin tuck, upper trap stretch, levator scapulae  stretching, Scapular retraction    Consulted and Agree with Plan of Care  Patient       Patient will benefit from skilled therapeutic intervention in order to improve the following deficits and impairments:  Pain, Impaired UE functional use, Increased fascial restricitons, Decreased strength, Postural dysfunction, Improper body mechanics,  Decreased range of motion  Visit Diagnosis: Cervicalgia  Other muscle spasm  Abnormal posture  Muscle weakness (generalized)     Problem List Patient Active Problem List   Diagnosis Date Noted  . Chronic pain of left knee 12/07/2014  . Benign prostatic hyperplasia 09/05/2014  . Benign hypertensive renal disease   . Type II diabetes mellitus with renal manifestations, uncontrolled ([place]) 06/12/2014  . Hyperlipidemia 06/12/2014  . Vasculogenic erectile dysfunction 06/12/2014  . Exotropia, monocular 06/12/2014  . Erectile dysfunction associated with type 2 diabetes mellitus ([place])    Starr Lake PT, DPT, LAT, ATC  03/22/18  3:53 PM      [facility] [address] [place], [place], [address] Phone: [phone]   Fax:  [phone]  Name: Arthur [name] MRN: [id number] Date of Birth: 06-22-1965

## 2018-03-24 ENCOUNTER — Encounter: Payer: Self-pay | Admitting: Physical Therapy

## 2018-03-24 ENCOUNTER — Ambulatory Visit: Payer: Managed Care, Other (non HMO) | Admitting: Physical Therapy

## 2018-03-24 ENCOUNTER — Other Ambulatory Visit: Payer: Self-pay

## 2018-03-24 DIAGNOSIS — M542 Cervicalgia: Secondary | ICD-10-CM | POA: Diagnosis not present

## 2018-03-24 DIAGNOSIS — M6281 Muscle weakness (generalized): Secondary | ICD-10-CM

## 2018-03-24 DIAGNOSIS — R293 Abnormal posture: Secondary | ICD-10-CM

## 2018-03-24 DIAGNOSIS — M62838 Other muscle spasm: Secondary | ICD-10-CM

## 2018-03-24 NOTE — Therapy (Signed)
[place], [place], [address] Phone: [phone]   Fax:  [phone]  Physical Therapy Treatment  Patient Details  Name: Arthur [name] MRN: [id number] Date of Birth: 11-03-1965 Referring Provider (PT):  Melvenia Beam, MD   Encounter Date: 03/24/2018  PT End of Session - 03/24/18 1722    Visit Number  6    Number of Visits  13    Date for PT Re-Evaluation  04/08/18    PT Start Time  1502    PT Stop Time  1553    PT Time Calculation (min)  51 min    Activity Tolerance  Patient limited by pain    Behavior During Therapy  [facility] for tasks assessed/performed       Past Medical History:  Diagnosis Date  . Diabetes mellitus with renal complications ([place])   . ED (erectile dysfunction)   . Erectile dysfunction associated with type 2 diabetes mellitus ([place])   . Exotropia   . Hyperlipidemia   . Hypertension   . Thoracic back pain   . Type II diabetes mellitus with renal manifestations, uncontrolled ([place]) 06/12/2014    Past Surgical History:  Procedure Laterality Date  . HIP SURGERY    . SHOULDER SURGERY Left    bone spurs removed    There were no vitals filed for this visit.  Subjective Assessment - 03/24/18 1504    Subjective  Pt. reports overall noting mild improvement with therapy but continues with pain in regions including cervical, right upper trapezius/levator and periscapular region and right shoulder. Pt. reports plans on checking with MD re: referral for PT for shoulder as shoulder issues have been ongoing.                       [facility] Adult PT Treatment/Exercise - 03/24/18 0001      Neck Exercises: Theraband   Shoulder Extension  15 reps;Red    Rows  15 reps;Red      Neck Exercises: Supine   Neck Retraction  15 reps      Moist Heat Therapy   Number Minutes Moist Heat  10 Minutes    Moist Heat Location  Cervical   in supine     Manual Therapy   Joint Mobilization   lateral glides C5-7 region     Soft tissue mobilization  STM right upper trapezius and periscapular region    Other Manual Therapy  gentle suboccipital release and manual traction      Neck Exercises: Stretches   Upper Trapezius Stretch  Right;3 reps;30 seconds    Levator Stretch  Right;3 reps;30 seconds             PT Education - 03/24/18 1721    Education Details  POC, pain neuroscience education    Person(s) Educated  Patient    Methods  Explanation    Comprehension  Verbalized understanding       PT Short Term Goals - 03/14/18 1512      PT SHORT TERM GOAL #1   Title  pt to be I with inital HEP    Baseline  met for initial HEP, will update prn    Time  3    Period  Weeks    Status  New    Target Date  03/18/18      PT SHORT TERM GOAL #2   Title  pt to demo / verbalize proper posture and lifting mechanics to  reduce and prevent neck pain/ spasm    Baseline  tendency forward head, limited ability to for lifting due to pain, spasm    Time  3    Period  Weeks    Status  On-going      PT SHORT TERM GOAL #3   Title  reduce upper trap/ levator scapulae and surrounding musculature to decrease pain to </= 4/10 and promote cervical ROM.     Baseline  6/10 pain, limited ROM with spasm    Time  3    Period  Weeks    Status  On-going    Target Date  03/18/18        PT Long Term Goals - 02/25/18 1037      PT LONG TERM GOAL #1   Title  pt to increase cervical moblity by >/= 10 degrees in all planes to promote functional ROM for ADLs     Time  6    Period  Weeks    Status  New    Target Date  04/08/18      PT LONG TERM GOAL #2   Title  pt to be able to sit/ stand or walk >/= 1 hour with </= 1/10 pain for functional endurance for potential work related activities    Time  6    Period  Weeks    Status  New    Target Date  04/08/18      PT LONG TERM GOAL #3   Title  increase FOTO score to </= 42% limited to demo improvement in function     Time  6    Period   Weeks    Status  New    Target Date  04/08/18      PT LONG TERM GOAL #4   Title  pt to be I with all HEP given as of last visit to maintain and progress current level of function     Time  6    Period  Weeks    Status  New    Target Date  04/08/18            Plan - 03/24/18 1723    Clinical Impression Statement  Mild improvement with therapy for decrease in neck pain from more severe to more moderate pain level. Continues with neck stiffness and myofascial restrition as well as shoulder limitations with limited AROM.     Stability/Clinical Decision Making  Evolving/Moderate complexity    Clinical Decision Making  Moderate    Rehab Potential  Good    PT Frequency  2x / week    PT Duration  6 weeks    PT Treatment/Interventions  ADLs/Self Care Home Management;Cryotherapy;Electrical Stimulation;Iontophoresis 9m/ml Dexamethasone;Moist Heat;Traction;Therapeutic exercise;Therapeutic activities;Dry needling;Taping;Manual techniques;Neuromuscular re-education;Patient/family education;Passive range of motion    PT Next Visit Plan  continue exercise progression as tolerated for neck ROM, stretches and postural strengthening, further dry needling prn, continue manual therapy and modalities as needed    PT Home Exercise Plan  chin tuck, upper trap stretch, levator scapulae stretching, Scapular retraction    Consulted and Agree with Plan of Care  Patient       Patient will benefit from skilled therapeutic intervention in order to improve the following deficits and impairments:  Pain, Impaired UE functional use, Increased fascial restricitons, Decreased strength, Postural dysfunction, Improper body mechanics, Decreased range of motion  Visit Diagnosis: Cervicalgia  Other muscle spasm  Abnormal posture  Muscle weakness (generalized)     Problem  List Patient Active Problem List   Diagnosis Date Noted  . Chronic pain of left knee 12/07/2014  . Benign prostatic hyperplasia 09/05/2014   . Benign hypertensive renal disease   . Type II diabetes mellitus with renal manifestations, uncontrolled ([place]) 06/12/2014  . Hyperlipidemia 06/12/2014  . Vasculogenic erectile dysfunction 06/12/2014  . Exotropia, monocular 06/12/2014  . Erectile dysfunction associated with type 2 diabetes mellitus [facility])     Beaulah Dinning, PT, DPT 03/24/18 5:26 PM  [facility] [address] [place], [place], [address] Phone: [phone]   Fax:  [phone]  Name: Arthur [name] MRN: [id number] Date of Birth: May 12, 1965

## 2018-03-29 ENCOUNTER — Ambulatory Visit: Payer: Managed Care, Other (non HMO) | Admitting: Physical Therapy

## 2018-03-31 ENCOUNTER — Ambulatory Visit: Payer: Managed Care, Other (non HMO) | Admitting: Physical Therapy

## 2018-04-05 ENCOUNTER — Ambulatory Visit: Payer: Managed Care, Other (non HMO) | Admitting: Physical Therapy

## 2018-04-07 ENCOUNTER — Ambulatory Visit: Payer: Managed Care, Other (non HMO) | Admitting: Physical Therapy

## 2018-04-10 ENCOUNTER — Other Ambulatory Visit: Payer: Self-pay | Admitting: Family Medicine

## 2018-04-12 ENCOUNTER — Ambulatory Visit: Payer: Managed Care, Other (non HMO) | Admitting: Physical Therapy

## 2018-05-06 ENCOUNTER — Telehealth: Payer: Self-pay | Admitting: Physical Therapy

## 2018-05-06 NOTE — Telephone Encounter (Signed)
Spoke with pt and informed them unfortunately their insurance benefits do not cover Telehealth. He reports consistency with his HEP but continues to have pain and discomfort from day to day.  He reported he would like the clinic to reach out to him once we reopen and resume physical therapy.

## 2018-05-11 ENCOUNTER — Other Ambulatory Visit: Payer: Self-pay

## 2018-05-11 ENCOUNTER — Encounter: Payer: Self-pay | Admitting: Physical Therapy

## 2018-05-11 ENCOUNTER — Ambulatory Visit: Payer: Managed Care, Other (non HMO) | Attending: Neurology | Admitting: Physical Therapy

## 2018-05-11 DIAGNOSIS — M6281 Muscle weakness (generalized): Secondary | ICD-10-CM | POA: Diagnosis present

## 2018-05-11 DIAGNOSIS — R293 Abnormal posture: Secondary | ICD-10-CM | POA: Diagnosis present

## 2018-05-11 DIAGNOSIS — M62838 Other muscle spasm: Secondary | ICD-10-CM | POA: Insufficient documentation

## 2018-05-11 DIAGNOSIS — M542 Cervicalgia: Secondary | ICD-10-CM | POA: Insufficient documentation

## 2018-05-11 NOTE — Therapy (Signed)
[place], [place], [address] Phone: [phone]   Fax:  [phone]  Physical Therapy Treatment  Patient Details  Name: Arthur [name] MRN: [id number] Date of Birth: [birth year] Referring Provider (PT):  Melvenia Beam, MD   Encounter Date: 05/11/2018  PT End of Session - 05/11/18 1155    Visit Number  7    Number of Visits  13    Date for PT Re-Evaluation  05/11/18    PT Start Time  1740    PT Stop Time  1220   shortened session due to discharge   PT Time Calculation (min)  32 min    Activity Tolerance  Patient tolerated treatment well    Behavior During Therapy  [facility] for tasks assessed/performed       Past Medical History:  Diagnosis Date  . Diabetes mellitus with renal complications ([place])   . ED (erectile dysfunction)   . Erectile dysfunction associated with type 2 diabetes mellitus ([place])   . Exotropia   . Hyperlipidemia   . Hypertension   . Thoracic back pain   . Type II diabetes mellitus with renal manifestations, uncontrolled ([place]) 06/12/2014    Past Surgical History:  Procedure Laterality Date  . HIP SURGERY    . SHOULDER SURGERY Left    bone spurs removed    There were no vitals filed for this visit.  Subjective Assessment - 05/11/18 1149    Subjective  "I have still been trying to do projects around the house, my R arm is the sore and i've tried using my L arm more. When I did my exercises for the neck it did seem to make my shoulder more. I did use a R shoulder sling due to the pain, I didn't sleep very well"    Patient Stated Goals  to be pain free, to be able to standing/ read for longer periods of time.  spend more time with family.     Currently in Pain?  Yes    Pain Score  4     Pain Location  Neck    Pain Orientation  Right    Pain Descriptors / Indicators  Aching;Sore;Throbbing    Pain Type  Chronic pain    Pain Onset  More than a month ago    Pain Frequency  Constant     Aggravating Factors   working,     Pain Relieving Factors  pain medication, resting,     Pain Score  5    Pain Location  Shoulder    Pain Orientation  Right;Posterior    Pain Descriptors / Indicators  Aching;Sore;Sharp    Pain Type  Chronic pain    Pain Onset  More than a month ago    Pain Frequency  Constant    Aggravating Factors   lifting/ carrying moving around, standing/ walking    Pain Relieving Factors  medication         [facility] PT Assessment - 05/11/18 0001      Assessment   Medical Diagnosis   Neck pain, Cervical myofascial strain, initial encounter     Referring Provider (PT)   Melvenia Beam, MD    Hand Dominance  Right      Observation/Other Assessments   Focus on Therapeutic Outcomes (FOTO)   51% limited      AROM   Right Shoulder Flexion  105 Degrees    Right Shoulder ABduction  67 Degrees  Right Shoulder External Rotation  --   able to reach crown of head   Right Shoulder Horizontal ABduction  --   to illiac crest   Cervical Flexion  22   ERP reproducing concordant symptoms   Cervical Extension  23   ERP reproducing concordant symptoms   Cervical - Right Side Bend  16   ERP reproducing concordant symptoms   Cervical - Left Side Bend  12   ERP reproducing concordant symptoms   Cervical - Right Rotation  39    Cervical - Left Rotation  37                           PT Education - 05/11/18 1224    Education Details  reviewed HEP and importance of continued strengthening. dicussed Poc due to lack of functional progress and continued pain. reviewed posture and benefits of maintain good posture especially during lifting and transfers    Person(s) Educated  Patient    Methods  Explanation;Verbal cues    Comprehension  Verbalized understanding;Verbal cues required       PT Short Term Goals - 05/11/18 1225      PT SHORT TERM GOAL #1   Title  pt to be I with inital HEP    Time  3    Period  Weeks    Status  Achieved      PT  SHORT TERM GOAL #2   Title  pt to demo / verbalize proper posture and lifting mechanics to reduce and prevent neck pain/ spasm    Baseline  tendency forward head, limited ability to for lifting due to pain, spasm    Time  3    Period  Weeks    Status  Not Met      PT SHORT TERM GOAL #3   Title  reduce upper trap/ levator scapulae and surrounding musculature to decrease pain to </= 4/10 and promote cervical ROM.     Time  3    Period  Weeks    Status  Not Met        PT Long Term Goals - 05/11/18 1225      PT LONG TERM GOAL #1   Title  pt to increase cervical moblity by >/= 10 degrees in all planes to promote functional ROM for ADLs     Time  6    Period  Weeks    Status  Not Met      PT LONG TERM GOAL #2   Title  pt to be able to sit/ stand or walk >/= 1 hour with </= 1/10 pain for functional endurance for potential work related activities    Time  6    Period  Weeks    Status  Not Met      PT LONG TERM GOAL #3   Title  increase FOTO score to </= 42% limited to demo improvement in function     Time  6    Period  Weeks    Status  Not Met      PT LONG TERM GOAL #4   Title  pt to be I with all HEP given as of last visit to maintain and progress current level of function     Time  6    Period  Weeks    Status  Partially Met            Plan - 05/11/18 1222  Clinical Impression Statement  Mr. levene reports he has been consistent with his HEP and notes limited improvement. He demonstrates limited progress and infact is demonstrating regression in ROM in both the neck and shoulder in some planes. He has not made any progress toward his goals and on previous sessions demonstrated limited carryover regarding pain relief and improvement in function. Based on lack of funcitonal porgress and continued pain he would benefit from returning back to his MD for further assessment and will be discharged from PT today.     PT Treatment/Interventions  ADLs/Self Care Home  Management;Cryotherapy;Electrical Stimulation;Iontophoresis '4mg'$ /ml Dexamethasone;Moist Heat;Traction;Therapeutic exercise;Therapeutic activities;Dry needling;Taping;Manual techniques;Neuromuscular re-education;Patient/family education;Passive range of motion    PT Next Visit Plan  D/C    PT Home Exercise Plan  chin tuck, upper trap stretch, levator scapulae stretching, Scapular retraction    Consulted and Agree with Plan of Care  Patient       Patient will benefit from skilled therapeutic intervention in order to improve the following deficits and impairments:  Pain, Impaired UE functional use, Increased fascial restricitons, Decreased strength, Postural dysfunction, Improper body mechanics, Decreased range of motion  Visit Diagnosis: Other muscle spasm  Abnormal posture  Muscle weakness (generalized)  Cervicalgia     Problem List Patient Active Problem List   Diagnosis Date Noted  . Chronic pain of left knee 12/07/2014  . Benign prostatic hyperplasia 09/05/2014  . Benign hypertensive renal disease   . Type II diabetes mellitus with renal manifestations, uncontrolled ([place]) 06/12/2014  . Hyperlipidemia 06/12/2014  . Vasculogenic erectile dysfunction 06/12/2014  . Exotropia, monocular 06/12/2014  . Erectile dysfunction associated with type 2 diabetes mellitus ([place])    [place] PT, DPT, LAT, ATC  05/11/18  12:27 PM      [facility] [address], [place], [address] Phone: [phone]   Fax:  [phone]  Name: Arthur [name] MRN: [id number] Date of Birth: 11-Jul-1965

## 2018-06-21 ENCOUNTER — Telehealth: Payer: Self-pay | Admitting: Family Medicine

## 2018-06-21 NOTE — Telephone Encounter (Signed)
Copied from [place] [id number]. Topic: Referral - Request for Referral >> Jun 21, 2018 10:34 AM Nils Flack wrote: Has patient seen PCP for this complaint? Yes.   *If NO, is insurance requiring patient see PCP for this issue before PCP can refer them? Referral for which specialty: ortho  Preferred provider/office: [facility] - [place], [address] Reason for referral: pt needs shoulder surgery, says this office sent over ppw, he is not sure if it is referral or pre surgery clearance  Please call pt  [phone]

## 2018-06-21 NOTE — Telephone Encounter (Signed)
Appt scheduled for Friday, possible surgical clearance, new referral or [facility].

## 2018-06-21 NOTE — Telephone Encounter (Signed)
I'm not sure what this means. Please find out- if he needs surgical clearance, will need to be seen. He is also overdue for his follow up on his diabetes.

## 2018-06-24 ENCOUNTER — Encounter: Payer: Self-pay | Admitting: Family Medicine

## 2018-06-24 ENCOUNTER — Ambulatory Visit: Payer: Managed Care, Other (non HMO) | Admitting: Family Medicine

## 2018-06-24 ENCOUNTER — Other Ambulatory Visit: Payer: Self-pay

## 2018-06-24 VITALS — BP 131/79 | HR 74 | Temp 98.2°F | Ht 66.0 in | Wt 159.6 lb

## 2018-06-24 DIAGNOSIS — E782 Mixed hyperlipidemia: Secondary | ICD-10-CM

## 2018-06-24 DIAGNOSIS — E1165 Type 2 diabetes mellitus with hyperglycemia: Secondary | ICD-10-CM

## 2018-06-24 DIAGNOSIS — I129 Hypertensive chronic kidney disease with stage 1 through stage 4 chronic kidney disease, or unspecified chronic kidney disease: Secondary | ICD-10-CM

## 2018-06-24 DIAGNOSIS — IMO0002 Reserved for concepts with insufficient information to code with codable children: Secondary | ICD-10-CM

## 2018-06-24 DIAGNOSIS — N400 Enlarged prostate without lower urinary tract symptoms: Secondary | ICD-10-CM

## 2018-06-24 DIAGNOSIS — E1129 Type 2 diabetes mellitus with other diabetic kidney complication: Secondary | ICD-10-CM | POA: Diagnosis not present

## 2018-06-24 LAB — UA/M W/RFLX CULTURE, ROUTINE
Bilirubin, UA: NEGATIVE
Glucose, UA: NEGATIVE
Ketones, UA: NEGATIVE
Leukocytes,UA: NEGATIVE
Nitrite, UA: NEGATIVE
Protein,UA: NEGATIVE
RBC, UA: NEGATIVE
Specific Gravity, UA: 1.015 (ref 1.005–1.030)
Urobilinogen, Ur: 1 mg/dL (ref 0.2–1.0)
pH, UA: 6 (ref 5.0–7.5)

## 2018-06-24 LAB — BAYER DCA HB A1C WAIVED: HB A1C (BAYER DCA - WAIVED): 8.1 % — ABNORMAL HIGH (ref <7.0)

## 2018-06-24 LAB — MICROALBUMIN, URINE WAIVED
Creatinine, Urine Waived: 100 mg/dL (ref 10–300)
Microalb, Ur Waived: 80 mg/L — ABNORMAL HIGH (ref 0–19)

## 2018-06-24 MED ORDER — XULTOPHY 100-3.6 UNIT-MG/ML ~~LOC~~ SOPN: 50.0000 [IU] | PEN_INJECTOR | Freq: Every day | SUBCUTANEOUS | 1 refills | Status: DC

## 2018-06-24 MED ORDER — NOVOFINE PLUS 32G X 4 MM MISC: 4 refills | Status: DC

## 2018-06-24 MED ORDER — LISINOPRIL 10 MG PO TABS: 15.0000 mg | ORAL_TABLET | Freq: Every day | ORAL | 1 refills | Status: DC

## 2018-06-24 MED ORDER — METFORMIN HCL 1000 MG PO TABS: ORAL_TABLET | ORAL | 1 refills | Status: DC

## 2018-06-24 MED ORDER — ATORVASTATIN CALCIUM 40 MG PO TABS: 80.0000 mg | ORAL_TABLET | Freq: Every day | ORAL | 1 refills | Status: DC

## 2018-06-24 NOTE — Assessment & Plan Note (Signed)
Under good control on current regimen. Continue current regimen. Continue to monitor. Call with any concerns. Refills given. Labs drawn today.   
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
## 2018-06-24 NOTE — Assessment & Plan Note (Signed)
Doing better with A1c of 8.1- continue current regimen. Continue to monitor. Call with any concerns. Recheck 3 months.

## 2018-06-25 LAB — COMPREHENSIVE METABOLIC PANEL
ALT: 15 IU/L (ref 0–44)
AST: 16 IU/L (ref 0–40)
Albumin/Globulin Ratio: 1.9 (ref 1.2–2.2)
Albumin: 4 g/dL (ref 3.8–4.9)
Alkaline Phosphatase: 86 IU/L (ref 39–117)
BUN/Creatinine Ratio: 14 (ref 9–20)
BUN: 16 mg/dL (ref 6–24)
Bilirubin Total: <0.2 mg/dL (ref 0.0–1.2)
CO2: 22 mmol/L (ref 20–29)
Calcium: 9.5 mg/dL (ref 8.7–10.2)
Chloride: 106 mmol/L (ref 96–106)
Creatinine, Ser: 1.14 mg/dL (ref 0.76–1.27)
GFR calc Af Amer: 83 mL/min/{1.73_m2} (ref >59)
GFR calc non Af Amer: 72 mL/min/{1.73_m2} (ref >59)
Globulin, Total: 2.1 g/dL (ref 1.5–4.5)
Glucose: 185 mg/dL — ABNORMAL HIGH (ref 65–99)
Potassium: 4.9 mmol/L (ref 3.5–5.2)
Sodium: 141 mmol/L (ref 134–144)
Total Protein: 6.1 g/dL (ref 6.0–8.5)

## 2018-06-25 LAB — CBC WITH DIFFERENTIAL/PLATELET
Basophils Absolute: 0 10*3/uL (ref 0.0–0.2)
Basos: 0 %
EOS (ABSOLUTE): 0.3 10*3/uL (ref 0.0–0.4)
Eos: 4 %
Hematocrit: 40.1 % (ref 37.5–51.0)
Hemoglobin: 13.2 g/dL (ref 13.0–17.7)
Immature Grans (Abs): 0 10*3/uL (ref 0.0–0.1)
Immature Granulocytes: 0 %
Lymphocytes Absolute: 2.7 10*3/uL (ref 0.7–3.1)
Lymphs: 45 %
MCH: 27.1 pg (ref 26.6–33.0)
MCHC: 32.9 g/dL (ref 31.5–35.7)
MCV: 82 fL (ref 79–97)
Monocytes Absolute: 0.4 10*3/uL (ref 0.1–0.9)
Monocytes: 7 %
Neutrophils Absolute: 2.7 10*3/uL (ref 1.4–7.0)
Neutrophils: 44 %
Platelets: 343 10*3/uL (ref 150–450)
RBC: 4.87 x10E6/uL (ref 4.14–5.80)
RDW: 13.5 % (ref 11.6–15.4)
WBC: 6.1 10*3/uL (ref 3.4–10.8)

## 2018-06-25 LAB — PSA: Prostate Specific Ag, Serum: 0.8 ng/mL (ref 0.0–4.0)

## 2018-06-25 LAB — LIPID PANEL W/O CHOL/HDL RATIO
Cholesterol, Total: 131 mg/dL (ref 100–199)
HDL: 39 mg/dL — ABNORMAL LOW (ref >39)
LDL Calculated: 66 mg/dL (ref 0–99)
Triglycerides: 128 mg/dL (ref 0–149)
VLDL Cholesterol Cal: 26 mg/dL (ref 5–40)

## 2018-06-25 LAB — TSH: TSH: 4.47 u[IU]/mL (ref 0.450–4.500)

## 2018-08-01 ENCOUNTER — Encounter: Payer: Self-pay | Admitting: Family Medicine

## 2018-08-01 ENCOUNTER — Other Ambulatory Visit: Payer: Self-pay

## 2018-08-01 ENCOUNTER — Ambulatory Visit: Payer: Managed Care, Other (non HMO) | Admitting: Family Medicine

## 2018-08-01 VITALS — BP 116/77 | HR 86 | Temp 98.8°F | Ht 66.0 in | Wt 160.0 lb

## 2018-08-01 DIAGNOSIS — N529 Male erectile dysfunction, unspecified: Secondary | ICD-10-CM

## 2018-08-01 DIAGNOSIS — E1129 Type 2 diabetes mellitus with other diabetic kidney complication: Secondary | ICD-10-CM

## 2018-08-01 DIAGNOSIS — R0981 Nasal congestion: Secondary | ICD-10-CM

## 2018-08-01 DIAGNOSIS — Z01818 Encounter for other preprocedural examination: Secondary | ICD-10-CM | POA: Diagnosis not present

## 2018-08-01 DIAGNOSIS — E1165 Type 2 diabetes mellitus with hyperglycemia: Secondary | ICD-10-CM

## 2018-08-01 DIAGNOSIS — IMO0002 Reserved for concepts with insufficient information to code with codable children: Secondary | ICD-10-CM

## 2018-08-01 LAB — BAYER DCA HB A1C WAIVED: HB A1C (BAYER DCA - WAIVED): 8.3 % — ABNORMAL HIGH (ref <7.0)

## 2018-08-01 MED ORDER — FLUTICASONE PROPIONATE 50 MCG/ACT NA SUSP: 2.0000 | Freq: Every day | NASAL | 6 refills | Status: DC

## 2018-08-01 MED ORDER — MONTELUKAST SODIUM 10 MG PO TABS: 10.0000 mg | ORAL_TABLET | Freq: Every day | ORAL | 3 refills | Status: DC

## 2018-08-01 MED ORDER — SILDENAFIL CITRATE 100 MG PO TABS: 50.0000 mg | ORAL_TABLET | Freq: Every day | ORAL | 11 refills | Status: DC | PRN

## 2018-08-01 MED ORDER — EMPAGLIFLOZIN 25 MG PO TABS: 25.0000 mg | ORAL_TABLET | Freq: Every day | ORAL | 3 refills | Status: DC
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
## 2018-08-01 NOTE — Assessment & Plan Note (Signed)
A1c up to 8.3- will start jardiance. Needs to be 7.5 or below for shoulder surgery. Will recheck 1 month and do clearance if below 7.5.

## 2018-09-01 ENCOUNTER — Ambulatory Visit (INDEPENDENT_AMBULATORY_CARE_PROVIDER_SITE_OTHER): Payer: Managed Care, Other (non HMO) | Admitting: Family Medicine

## 2018-09-01 ENCOUNTER — Other Ambulatory Visit: Payer: Self-pay

## 2018-09-01 ENCOUNTER — Encounter: Payer: Self-pay | Admitting: Family Medicine

## 2018-09-01 VITALS — BP 116/75 | HR 76 | Temp 98.7°F

## 2018-09-01 DIAGNOSIS — Z01818 Encounter for other preprocedural examination: Secondary | ICD-10-CM | POA: Diagnosis not present

## 2018-09-01 DIAGNOSIS — E1129 Type 2 diabetes mellitus with other diabetic kidney complication: Secondary | ICD-10-CM | POA: Diagnosis not present

## 2018-09-01 DIAGNOSIS — Z23 Encounter for immunization: Secondary | ICD-10-CM | POA: Diagnosis not present

## 2018-09-01 DIAGNOSIS — E1165 Type 2 diabetes mellitus with hyperglycemia: Secondary | ICD-10-CM | POA: Diagnosis not present

## 2018-09-01 DIAGNOSIS — IMO0002 Reserved for concepts with insufficient information to code with codable children: Secondary | ICD-10-CM

## 2018-09-01 LAB — COAGUCHEK XS/INR WAIVED
INR: 1 (ref 0.9–1.1)
Prothrombin Time: 11.8 s

## 2018-09-01 LAB — BAYER DCA HB A1C WAIVED: HB A1C (BAYER DCA - WAIVED): 7.3 % — ABNORMAL HIGH (ref <7.0)

## 2018-09-01 NOTE — Assessment & Plan Note (Signed)
Doing great with A1c of 7.3! Continue current regimen and recheck 3 months. Call with any concerns.

## 2018-09-01 NOTE — Progress Notes (Signed)
BP 116/75   Pulse 76   Temp 98.7 F (37.1 C)   SpO2 99%    Subjective:    Patient ID: Arthur [name], male    DOB: 1965-07-20, 53 y.o.   MRN: [id number]  HPI: Arthur [name] is a 53 y.o. male  Chief Complaint  Patient presents with  . Diabetes   Has had surgery before. Has been under general anesthesia. He has never had any problems with anesthesia. No issues with extubation. No issues with post-op N/V. He has had problems with nausea with opiates. No family history of issues with anesthesia. No history or family history No SOB. No CP. Otherwise doing well. No other concerns or complaints at this time.   DIABETES Hypoglycemic episodes:no Polydipsia/polyuria: no Visual disturbance: no Chest pain: no Paresthesias: no Glucose Monitoring: yes  Accucheck frequency: Daily Taking Insulin?: no Blood Pressure Monitoring: not checking Retinal Examination: Not up to Date Foot Exam: Up to Date Diabetic Education: Not Completed Pneumovax: Up to Date Influenza: Up to Date Aspirin: yes   Active Ambulatory Problems    Diagnosis Date Noted  . Type II diabetes mellitus with renal manifestations, uncontrolled ([place]) 06/12/2014  . Hyperlipidemia 06/12/2014  . Vasculogenic erectile dysfunction 06/12/2014  . Exotropia, monocular 06/12/2014  . Erectile dysfunction associated with type 2 diabetes mellitus ([place])   . Benign hypertensive renal disease   . Benign prostatic hyperplasia 09/05/2014  . Chronic pain of left knee 12/07/2014   Resolved Ambulatory Problems    Diagnosis Date Noted  . Benign arteriolar nephrosclerosis 06/12/2014  . Uncontrolled type 2 DM with microalbuminuria or microproteinuria 06/12/2014  . Diabetes mellitus with renal complications ([place])   . ED (erectile dysfunction)   . Exotropia   . Thoracic back pain   . Preventative health care 09/05/2014  . Prostate cancer screening 09/05/2014  . Medication monitoring encounter 12/07/2014   Past Medical History:   Diagnosis Date  . Hypertension    Past Surgical History:  Procedure Laterality Date  . HIP SURGERY    . SHOULDER SURGERY Left    bone spurs removed   Outpatient Encounter Medications as of 09/01/2018  Medication Sig  . atorvastatin (LIPITOR) 40 MG tablet Take 2 tablets (80 mg total) by mouth daily.  . empagliflozin (JARDIANCE) 25 MG TABS tablet Take 25 mg by mouth daily.  . fluticasone (FLONASE) 50 MCG/ACT nasal spray Place 2 sprays into both nostrils daily.  Marland Kitchen glucose blood test strip 1 each by Other route as needed for other. One touch ultra test strips. Use as instructed.  . Insulin Degludec-Liraglutide (XULTOPHY) 100-3.6 UNIT-MG/ML SOPN Inject 50 Units into the skin at bedtime.  . Insulin Pen Needle (NOVOFINE PLUS) 32G X 4 MM MISC Use one needle daily with insulin  . lisinopril (ZESTRIL) 10 MG tablet Take 1.5 tablets (15 mg total) by mouth daily.  . metFORMIN (GLUCOPHAGE) 1000 MG tablet TAKE 1 TABLET BY MOUTH TWICE DAILY  . montelukast (SINGULAIR) 10 MG tablet Take 1 tablet (10 mg total) by mouth at bedtime.  . sildenafil (VIAGRA) 100 MG tablet Take 0.5-1 tablets (50-100 mg total) by mouth daily as needed for erectile dysfunction.   No facility-administered encounter medications on file as of 09/01/2018.    Allergies  Allergen Reactions  . Codeine   . Hydrocodone Nausea Only  . Oxycodone Nausea Only   Social History   Socioeconomic History  . Marital status: Married    Spouse name: Not on file  . Number of children:  Not on file  . Years of education: Not on file  . Highest education level: Not on file  Occupational History  . Not on file  Social Needs  . Financial resource strain: Not on file  . Food insecurity    Worry: Not on file    Inability: Not on file  . Transportation needs    Medical: Not on file    Non-medical: Not on file  Tobacco Use  . Smoking status: Former Smoker    Packs/day: 1.00    Years: 20.00    Pack years: 20.00    Types: Cigarettes     Quit date: 01/05/1986    Years since quitting: 32.6  . Smokeless tobacco: Never Used  Substance and Sexual Activity  . Alcohol use: No  . Drug use: No  . Sexual activity: Not on file  Lifestyle  . Physical activity    Days per week: Not on file    Minutes per session: Not on file  . Stress: Not on file  Relationships  . Social Herbalist on phone: Not on file    Gets together: Not on file    Attends religious service: Not on file    Active member of club or organization: Not on file    Attends meetings of clubs or organizations: Not on file    Relationship status: Not on file  Other Topics Concern  . Not on file  Social History Narrative  . Not on file   Family History  Problem Relation Age of Onset  . Diabetes Father   . Lung disease Mother   . Diabetes Sister   . Diabetes Maternal Aunt      Review of Systems  Constitutional: Negative.   Respiratory: Negative.   Cardiovascular: Negative.   Gastrointestinal: Negative.   Musculoskeletal: Negative.   Psychiatric/Behavioral: Negative.     Per HPI unless specifically indicated above     Objective:    BP 116/75   Pulse 76   Temp 98.7 F (37.1 C)   SpO2 99%   Wt Readings from Last 3 Encounters:  08/01/18 160 lb (72.6 kg)  06/24/18 159 lb 9.6 oz (72.4 kg)  02/17/18 159 lb 9.6 oz (72.4 kg)    Physical Exam Vitals signs and nursing note reviewed.  Constitutional:      General: He is not in acute distress.    Appearance: Normal appearance. He is not ill-appearing, toxic-appearing or diaphoretic.  HENT:     Head: Normocephalic and atraumatic.     Right Ear: External ear normal.     Left Ear: External ear normal.     Nose: Nose normal.     Mouth/Throat:     Mouth: Mucous membranes are moist.     Pharynx: Oropharynx is clear.  Eyes:     General: No scleral icterus.       Right eye: No discharge.        Left eye: No discharge.     Extraocular Movements: Extraocular movements intact.      Conjunctiva/sclera: Conjunctivae normal.     Pupils: Pupils are equal, round, and reactive to light.  Neck:     Musculoskeletal: Normal range of motion and neck supple.  Cardiovascular:     Rate and Rhythm: Normal rate and regular rhythm.     Pulses: Normal pulses.     Heart sounds: Normal heart sounds. No murmur. No friction rub. No gallop.   Pulmonary:     Effort:  Pulmonary effort is normal. No respiratory distress.     Breath sounds: Normal breath sounds. No stridor. No wheezing, rhonchi or rales.  Chest:     Chest wall: No tenderness.  Musculoskeletal: Normal range of motion.  Skin:    General: Skin is warm and dry.     Capillary Refill: Capillary refill takes less than 2 seconds.     Coloration: Skin is not jaundiced or pale.     Findings: No bruising, erythema, lesion or rash.  Neurological:     General: No focal deficit present.     Mental Status: He is alert and oriented to person, place, and time. Mental status is at baseline.  Psychiatric:        Mood and Affect: Mood normal.        Behavior: Behavior normal.        Thought Content: Thought content normal.        Judgment: Judgment normal.     Results for orders placed or performed in visit on 09/01/18  Bayer DCA Hb A1c Waived  Result Value Ref Range   HB A1C (BAYER DCA - WAIVED) 7.3 (H) <7.0 %  CoaguChek XS/INR Waived  Result Value Ref Range   INR 1.0 0.9 - 1.1   Prothrombin Time 11.8 sec      Assessment & Plan:   Problem List Items Addressed This Visit      Endocrine   Type II diabetes mellitus with renal manifestations, uncontrolled ([place]) - Primary    Doing great with A1c of 7.3! Continue current regimen and recheck 3 months. Call with any concerns.       Relevant Orders   Bayer DCA Hb A1c Waived (Completed)    Other Visit Diagnoses    Preop exam for internal medicine       A1c 7.3. EKG normal. Checking labs, assuming they are normal, cleared for surgery.    Relevant Orders   CBC with  Differential/Platelet   Comprehensive metabolic panel   CoaguChek XS/INR Waived (Completed)   EKG 12-Lead (Completed)   Needs flu shot       Flu shot given today.   Relevant Orders   Flu Vaccine QUAD 6+ mos PF IM (Fluarix Quad PF) (Completed)       Follow up plan: Return in about 3 months (around 12/02/2018) for follow up DM.

## 2018-09-02 LAB — COMPREHENSIVE METABOLIC PANEL
ALT: 15 IU/L (ref 0–44)
AST: 23 IU/L (ref 0–40)
Albumin/Globulin Ratio: 1.9 (ref 1.2–2.2)
Albumin: 4.2 g/dL (ref 3.8–4.9)
Alkaline Phosphatase: 91 IU/L (ref 39–117)
BUN/Creatinine Ratio: 9 (ref 9–20)
BUN: 11 mg/dL (ref 6–24)
Bilirubin Total: 0.3 mg/dL (ref 0.0–1.2)
CO2: 23 mmol/L (ref 20–29)
Calcium: 10 mg/dL (ref 8.7–10.2)
Chloride: 105 mmol/L (ref 96–106)
Creatinine, Ser: 1.2 mg/dL (ref 0.76–1.27)
GFR calc Af Amer: 78 mL/min/{1.73_m2} (ref >59)
GFR calc non Af Amer: 68 mL/min/{1.73_m2} (ref >59)
Globulin, Total: 2.2 g/dL (ref 1.5–4.5)
Glucose: 62 mg/dL — ABNORMAL LOW (ref 65–99)
Potassium: 5.3 mmol/L — ABNORMAL HIGH (ref 3.5–5.2)
Sodium: 142 mmol/L (ref 134–144)
Total Protein: 6.4 g/dL (ref 6.0–8.5)

## 2018-09-02 LAB — CBC WITH DIFFERENTIAL/PLATELET
Basophils Absolute: 0 10*3/uL (ref 0.0–0.2)
Basos: 0 %
EOS (ABSOLUTE): 0.2 10*3/uL (ref 0.0–0.4)
Eos: 4 %
Hematocrit: 39.1 % (ref 37.5–51.0)
Hemoglobin: 12.5 g/dL — ABNORMAL LOW (ref 13.0–17.7)
Immature Grans (Abs): 0 10*3/uL (ref 0.0–0.1)
Immature Granulocytes: 0 %
Lymphocytes Absolute: 2 10*3/uL (ref 0.7–3.1)
Lymphs: 38 %
MCH: 26.5 pg — ABNORMAL LOW (ref 26.6–33.0)
MCHC: 32 g/dL (ref 31.5–35.7)
MCV: 83 fL (ref 79–97)
Monocytes Absolute: 0.6 10*3/uL (ref 0.1–0.9)
Monocytes: 11 %
Neutrophils Absolute: 2.4 10*3/uL (ref 1.4–7.0)
Neutrophils: 47 %
Platelets: 323 10*3/uL (ref 150–450)
RBC: 4.71 x10E6/uL (ref 4.14–5.80)
RDW: 14 % (ref 11.6–15.4)
WBC: 5.3 10*3/uL (ref 3.4–10.8)

## 2018-09-27 ENCOUNTER — Ambulatory Visit: Payer: Managed Care, Other (non HMO) | Admitting: Family Medicine

## 2018-10-28 ENCOUNTER — Other Ambulatory Visit: Payer: Self-pay

## 2018-10-28 ENCOUNTER — Encounter: Payer: Self-pay | Admitting: Family Medicine

## 2018-10-28 ENCOUNTER — Ambulatory Visit (INDEPENDENT_AMBULATORY_CARE_PROVIDER_SITE_OTHER): Payer: Managed Care, Other (non HMO) | Admitting: Family Medicine

## 2018-10-28 VITALS — BP 144/85 | HR 83 | Temp 97.8°F

## 2018-10-28 DIAGNOSIS — I809 Phlebitis and thrombophlebitis of unspecified site: Secondary | ICD-10-CM

## 2018-10-28 NOTE — Progress Notes (Signed)
BP (!) 144/85   Pulse 83   Temp 97.8 F (36.6 C)   SpO2 99%    Subjective:    Patient ID: Arthur [name], male    DOB: 1965-05-28, 53 y.o.   MRN: [id number]  HPI: Arthur [name] is a 53 y.o. male  Chief Complaint  Patient presents with  . Arm Pain    Patient states that his son was trimming a tree about a week ago and the pole fell and hit is left arm, was sore but work up today with pain and what feels like a know in his vein   Patient here today with left forearm tenderness and pain after a large tree branch fell directly onto it about a week ago. Was sore from the start, but now forming a firm knot on forearm and states his whole vein is hard and tender near site of impact. Denies swelling, redness, heat, numbness, tingling. Not trying anything OTC for relief.   Relevant past medical, surgical, family and social history reviewed and updated as indicated. Interim medical history since our last visit reviewed. Allergies and medications reviewed and updated.  Review of Systems  Per HPI unless specifically indicated above     Objective:    BP (!) 144/85   Pulse 83   Temp 97.8 F (36.6 C)   SpO2 99%   Wt Readings from Last 3 Encounters:  08/01/18 160 lb (72.6 kg)  06/24/18 159 lb 9.6 oz (72.4 kg)  02/17/18 159 lb 9.6 oz (72.4 kg)    Physical Exam Vitals signs and nursing note reviewed.  Constitutional:      Appearance: Normal appearance.  HENT:     Head: Atraumatic.  Eyes:     Extraocular Movements: Extraocular movements intact.     Conjunctiva/sclera: Conjunctivae normal.  Neck:     Musculoskeletal: Normal range of motion and neck supple.  Cardiovascular:     Rate and Rhythm: Normal rate and regular rhythm.     Pulses: Normal pulses.  Pulmonary:     Effort: Pulmonary effort is normal.     Breath sounds: Normal breath sounds.  Musculoskeletal: Normal range of motion.        General: No swelling.  Skin:    General: Skin is warm and dry.     Findings: No  erythema.     Comments: Firm knot palpable at site of impact left forearm, ttp Vein running through this area diffusely firm and inflamed, mildly ttp   Neurological:     General: No focal deficit present.     Mental Status: He is oriented to person, place, and time.  Psychiatric:        Mood and Affect: Mood normal.        Thought Content: Thought content normal.        Judgment: Judgment normal.     Results for orders placed or performed in visit on 09/01/18  Bayer DCA Hb A1c Waived  Result Value Ref Range   HB A1C (BAYER DCA - WAIVED) 7.3 (H) <7.0 %  CBC with Differential/Platelet  Result Value Ref Range   WBC 5.3 3.4 - 10.8 x10E3/uL   RBC 4.71 4.14 - 5.80 x10E6/uL   Hemoglobin 12.5 (L) 13.0 - 17.7 g/dL   Hematocrit 39.1 37.5 - 51.0 %   MCV 83 79 - 97 fL   MCH 26.5 (L) 26.6 - 33.0 pg   MCHC 32.0 31.5 - 35.7 g/dL   RDW 14.0 11.6 - 15.4 %  Platelets 323 150 - 450 x10E3/uL   Neutrophils 47 Not Estab. %   Lymphs 38 Not Estab. %   Monocytes 11 Not Estab. %   Eos 4 Not Estab. %   Basos 0 Not Estab. %   Neutrophils Absolute 2.4 1.4 - 7.0 x10E3/uL   Lymphocytes Absolute 2.0 0.7 - 3.1 x10E3/uL   Monocytes Absolute 0.6 0.1 - 0.9 x10E3/uL   EOS (ABSOLUTE) 0.2 0.0 - 0.4 x10E3/uL   Basophils Absolute 0.0 0.0 - 0.2 x10E3/uL   Immature Granulocytes 0 Not Estab. %   Immature Grans (Abs) 0.0 0.0 - 0.1 x10E3/uL  Comprehensive metabolic panel  Result Value Ref Range   Glucose 62 (L) 65 - 99 mg/dL   BUN 11 6 - 24 mg/dL   Creatinine, Ser 1.20 0.76 - 1.27 mg/dL   GFR calc non Af Amer 68 >59 mL/min/1.73   GFR calc Af Amer 78 >59 mL/min/1.73   BUN/Creatinine Ratio 9 9 - 20   Sodium 142 134 - 144 mmol/L   Potassium 5.3 (H) 3.5 - 5.2 mmol/L   Chloride 105 96 - 106 mmol/L   CO2 23 20 - 29 mmol/L   Calcium 10.0 8.7 - 10.2 mg/dL   Total Protein 6.4 6.0 - 8.5 g/dL   Albumin 4.2 3.8 - 4.9 g/dL   Globulin, Total 2.2 1.5 - 4.5 g/dL   Albumin/Globulin Ratio 1.9 1.2 - 2.2   Bilirubin Total  0.3 0.0 - 1.2 mg/dL   Alkaline Phosphatase 91 39 - 117 IU/L   AST 23 0 - 40 IU/L   ALT 15 0 - 44 IU/L  CoaguChek XS/INR Waived  Result Value Ref Range   INR 1.0 0.9 - 1.1   Prothrombin Time 11.8 sec      Assessment & Plan:   Problem List Items Addressed This Visit    None    Visit Diagnoses    Superficial phlebitis    -  Primary   From direct impact, ice, epsom salt soaks reviewed. Strict return precautions given for worsening sxs       Follow up plan: Return if symptoms worsen or fail to improve.

## 2018-12-06 ENCOUNTER — Ambulatory Visit (INDEPENDENT_AMBULATORY_CARE_PROVIDER_SITE_OTHER): Payer: Managed Care, Other (non HMO) | Admitting: Family Medicine

## 2018-12-06 ENCOUNTER — Other Ambulatory Visit: Payer: Self-pay

## 2018-12-06 ENCOUNTER — Encounter: Payer: Self-pay | Admitting: Family Medicine

## 2018-12-06 VITALS — BP 135/81 | HR 75

## 2018-12-06 DIAGNOSIS — I129 Hypertensive chronic kidney disease with stage 1 through stage 4 chronic kidney disease, or unspecified chronic kidney disease: Secondary | ICD-10-CM

## 2018-12-06 DIAGNOSIS — E782 Mixed hyperlipidemia: Secondary | ICD-10-CM | POA: Diagnosis not present

## 2018-12-06 DIAGNOSIS — R0981 Nasal congestion: Secondary | ICD-10-CM

## 2018-12-06 DIAGNOSIS — N400 Enlarged prostate without lower urinary tract symptoms: Secondary | ICD-10-CM | POA: Diagnosis not present

## 2018-12-06 DIAGNOSIS — E1129 Type 2 diabetes mellitus with other diabetic kidney complication: Secondary | ICD-10-CM | POA: Diagnosis not present

## 2018-12-06 DIAGNOSIS — Z1211 Encounter for screening for malignant neoplasm of colon: Secondary | ICD-10-CM

## 2018-12-06 DIAGNOSIS — IMO0002 Reserved for concepts with insufficient information to code with codable children: Secondary | ICD-10-CM

## 2018-12-06 DIAGNOSIS — E1165 Type 2 diabetes mellitus with hyperglycemia: Secondary | ICD-10-CM

## 2018-12-06 MED ORDER — METFORMIN HCL 1000 MG PO TABS: ORAL_TABLET | ORAL | 1 refills | Status: DC

## 2018-12-06 MED ORDER — LISINOPRIL 10 MG PO TABS: 15.0000 mg | ORAL_TABLET | Freq: Every day | ORAL | 1 refills | Status: DC

## 2018-12-06 MED ORDER — XULTOPHY 100-3.6 UNIT-MG/ML ~~LOC~~ SOPN: 50.0000 [IU] | PEN_INJECTOR | Freq: Every day | SUBCUTANEOUS | 1 refills | Status: DC

## 2018-12-06 MED ORDER — EMPAGLIFLOZIN 25 MG PO TABS: 25.0000 mg | ORAL_TABLET | Freq: Every day | ORAL | 1 refills | Status: DC

## 2018-12-06 MED ORDER — ATORVASTATIN CALCIUM 40 MG PO TABS: 80.0000 mg | ORAL_TABLET | Freq: Every day | ORAL | 1 refills | Status: DC

## 2018-12-06 MED ORDER — FLUTICASONE PROPIONATE 50 MCG/ACT NA SUSP: 2.0000 | Freq: Every day | NASAL | 6 refills | Status: DC

## 2018-12-06 MED ORDER — MONTELUKAST SODIUM 10 MG PO TABS: 10.0000 mg | ORAL_TABLET | Freq: Every day | ORAL | 1 refills | Status: DC

## 2018-12-06 NOTE — Assessment & Plan Note (Signed)
Doing well. Will get labs drawn ASAP. Continue current regimen. Continue to monitor. Refills given today. Call with any concerns.

## 2018-12-06 NOTE — Progress Notes (Signed)
BP 135/81   Pulse 75    Subjective:    Patient ID: Arthur [name], male    DOB: [birth year], 53 y.o.   MRN: [id number]  HPI: Arthur [name] is a 53 y.o. male  Chief Complaint  Patient presents with  . Diabetes  . Sinus Problem   DIABETES Hypoglycemic episodes:no Polydipsia/polyuria: no Visual disturbance: no Chest pain: no Paresthesias: no Glucose Monitoring: yes  Accucheck frequency: Daily  Fasting glucose: 80s-90s Taking Insulin?: yes Blood Pressure Monitoring: not checking Retinal Examination: Not up to Date Foot Exam: Up to Date Diabetic Education: Completed Pneumovax: Up to Date Influenza: Up to Date Aspirin: no   HYPERTENSION / [place] Satisfied with current treatment? yes Duration of hypertension: chronic BP monitoring frequency: not checking BP medication side effects: no Past BP meds: lisinopril Duration of hyperlipidemia: chronic Cholesterol medication side effects: no Cholesterol supplements: none Past cholesterol medications: atorvastain (lipitor) Medication compliance: excellent compliance Aspirin: no Recent stressors: no Recurrent headaches: no Visual changes: no Palpitations: no Dyspnea: no Chest pain: no Lower extremity edema: no Dizzy/lightheaded: no  BPH BPH status: controlled Satisfied with current treatment?: yes Medication side effects: no Medication compliance: excellent compliance Duration: chronic Nocturia: 1-2x per night Urinary frequency:no Incomplete voiding: no Urgency: no Weak urinary stream: no Straining to start stream: no Dysuria: no Onset: gradual Severity: mild  Relevant past medical, surgical, family and social history reviewed and updated as indicated. Interim medical history since our last visit reviewed. Allergies and medications reviewed and updated.  Review of Systems  Constitutional: Negative.   HENT: Positive for congestion. Negative for dental problem, drooling, ear discharge, ear pain,  facial swelling, hearing loss, mouth sores, nosebleeds, postnasal drip, rhinorrhea, sinus pressure, sinus pain, sneezing, sore throat, tinnitus, trouble swallowing and voice change.   Respiratory: Negative.   Cardiovascular: Negative.   Musculoskeletal: Negative.   Neurological: Negative.   Psychiatric/Behavioral: Negative.     Per HPI unless specifically indicated above     Objective:    BP 135/81   Pulse 75   Wt Readings from Last 3 Encounters:  08/01/18 160 lb (72.6 kg)  06/24/18 159 lb 9.6 oz (72.4 kg)  02/17/18 159 lb 9.6 oz (72.4 kg)    Physical Exam Vitals signs and nursing note reviewed.  Constitutional:      General: He is not in acute distress.    Appearance: Normal appearance. He is not ill-appearing, toxic-appearing or diaphoretic.  HENT:     Head: Normocephalic and atraumatic.     Right Ear: External ear normal.     Left Ear: External ear normal.     Nose: Nose normal.     Mouth/Throat:     Mouth: Mucous membranes are moist.     Pharynx: Oropharynx is clear.  Eyes:     General: No scleral icterus.       Right eye: No discharge.        Left eye: No discharge.     Conjunctiva/sclera: Conjunctivae normal.     Pupils: Pupils are equal, round, and reactive to light.  Neck:     Musculoskeletal: Normal range of motion.  Pulmonary:     Effort: Pulmonary effort is normal. No respiratory distress.     Comments: Speaking in full sentences Musculoskeletal: Normal range of motion.  Skin:    Coloration: Skin is not jaundiced or pale.     Findings: No bruising, erythema, lesion or rash.  Neurological:     Mental Status: He is alert and  oriented to person, place, and time. Mental status is at baseline.  Psychiatric:        Mood and Affect: Mood normal.        Behavior: Behavior normal.        Thought Content: Thought content normal.        Judgment: Judgment normal.     Results for orders placed or performed in visit on 09/01/18  Bayer DCA Hb A1c Waived   Result Value Ref Range   HB A1C (BAYER DCA - WAIVED) 7.3 (H) <7.0 %  CBC with Differential/Platelet  Result Value Ref Range   WBC 5.3 3.4 - 10.8 x10E3/uL   RBC 4.71 4.14 - 5.80 x10E6/uL   Hemoglobin 12.5 (L) 13.0 - 17.7 g/dL   Hematocrit 39.1 37.5 - 51.0 %   MCV 83 79 - 97 fL   MCH 26.5 (L) 26.6 - 33.0 pg   MCHC 32.0 31.5 - 35.7 g/dL   RDW 14.0 11.6 - 15.4 %   Platelets 323 150 - 450 x10E3/uL   Neutrophils 47 Not Estab. %   Lymphs 38 Not Estab. %   Monocytes 11 Not Estab. %   Eos 4 Not Estab. %   Basos 0 Not Estab. %   Neutrophils Absolute 2.4 1.4 - 7.0 x10E3/uL   Lymphocytes Absolute 2.0 0.7 - 3.1 x10E3/uL   Monocytes Absolute 0.6 0.1 - 0.9 x10E3/uL   EOS (ABSOLUTE) 0.2 0.0 - 0.4 x10E3/uL   Basophils Absolute 0.0 0.0 - 0.2 x10E3/uL   Immature Granulocytes 0 Not Estab. %   Immature Grans (Abs) 0.0 0.0 - 0.1 x10E3/uL  Comprehensive metabolic panel  Result Value Ref Range   Glucose 62 (L) 65 - 99 mg/dL   BUN 11 6 - 24 mg/dL   Creatinine, Ser 1.20 0.76 - 1.27 mg/dL   GFR calc non Af Amer 68 >59 mL/min/1.73   GFR calc Af Amer 78 >59 mL/min/1.73   BUN/Creatinine Ratio 9 9 - 20   Sodium 142 134 - 144 mmol/L   Potassium 5.3 (H) 3.5 - 5.2 mmol/L   Chloride 105 96 - 106 mmol/L   CO2 23 20 - 29 mmol/L   Calcium 10.0 8.7 - 10.2 mg/dL   Total Protein 6.4 6.0 - 8.5 g/dL   Albumin 4.2 3.8 - 4.9 g/dL   Globulin, Total 2.2 1.5 - 4.5 g/dL   Albumin/Globulin Ratio 1.9 1.2 - 2.2   Bilirubin Total 0.3 0.0 - 1.2 mg/dL   Alkaline Phosphatase 91 39 - 117 IU/L   AST 23 0 - 40 IU/L   ALT 15 0 - 44 IU/L  CoaguChek XS/INR Waived  Result Value Ref Range   INR 1.0 0.9 - 1.1   Prothrombin Time 11.8 sec      Assessment & Plan:   Problem List Items Addressed This Visit      Endocrine   Type II diabetes mellitus with renal manifestations, uncontrolled ([place]) - Primary    Doing well. Will get labs drawn ASAP. Continue current regimen. Continue to monitor. Refills given today. Call with any  concerns.       Relevant Medications   lisinopril (ZESTRIL) 10 MG tablet   Insulin Degludec-Liraglutide (XULTOPHY) 100-3.6 UNIT-MG/ML SOPN   atorvastatin (LIPITOR) 40 MG tablet   empagliflozin (JARDIANCE) 25 MG TABS tablet   metFORMIN (GLUCOPHAGE) 1000 MG tablet   Other Relevant Orders   CBC with Differential OUT   Bayer DCA Hb A1c Waived   Comp Met (CMET)   Microalbumin, Urine  Waived     Genitourinary   Benign hypertensive renal disease    Under good control on current regimen. Continue current regimen. Continue to monitor. Call with any concerns. Refills given. Labs to be drawn ASAP.       Relevant Orders   CBC with Differential OUT   Comp Met (CMET)   Microalbumin, Urine Waived   Benign prostatic hyperplasia    Under good control on current regimen. Continue current regimen. Continue to monitor. Call with any concerns. Refills given. Labs to be drawn ASAP.       Relevant Orders   CBC with Differential OUT   Comp Met (CMET)   PSA     Other   Hyperlipidemia    Under good control on current regimen. Continue current regimen. Continue to monitor. Call with any concerns. Refills given. Labs to be drawn ASAP.       Relevant Medications   lisinopril (ZESTRIL) 10 MG tablet   atorvastatin (LIPITOR) 40 MG tablet   Other Relevant Orders   CBC with Differential OUT   Comp Met (CMET)   Lipid Panel w/o Chol/HDL Ratio OUT    Other Visit Diagnoses    Chronic nasal congestion       Will restart singulair and continue flonase. Call with any concerns or if not getting better.   Screening for colon cancer       Referral to GI made today.   Relevant Orders   Ambulatory referral to Gastroenterology       Follow up plan: Return in about 3 months (around 03/06/2019).    . This visit was completed via [employer] due to the restrictions of the COVID-19 pandemic. All issues as above were discussed and addressed. Physical exam was done as above through visual confirmation on  [employer]. If it was felt that the patient should be evaluated in the office, they were directed there. The patient verbally consented to this visit. . Location of the patient: home . Location of the provider: work . Those involved with this call:  . Provider: Park Liter, DO . CMA: Tiffany Reel, CMA . Front Desk/Registration: Don Perking  . Time spent on call: 25 minutes with patient face to face via video conference. More than 50% of this time was spent in counseling and coordination of care. 40 minutes total spent in review of patient's record and preparation of their chart.

## 2018-12-06 NOTE — Assessment & Plan Note (Signed)
Under good control on current regimen. Continue current regimen. Continue to monitor. Call with any concerns. Refills given. Labs to be drawn ASAP.   

## 2018-12-08 ENCOUNTER — Telehealth: Payer: Self-pay

## 2018-12-08 NOTE — Telephone Encounter (Signed)
PA for [employer] initiated and submitted via Cover My Meds. Key: [id number]

## 2018-12-09 NOTE — Telephone Encounter (Signed)
PA approved.

## 2018-12-14 ENCOUNTER — Other Ambulatory Visit: Payer: Managed Care, Other (non HMO)

## 2018-12-20 ENCOUNTER — Ambulatory Visit (INDEPENDENT_AMBULATORY_CARE_PROVIDER_SITE_OTHER): Payer: Managed Care, Other (non HMO) | Admitting: Unknown Physician Specialty

## 2018-12-20 ENCOUNTER — Other Ambulatory Visit: Payer: Self-pay

## 2018-12-20 ENCOUNTER — Other Ambulatory Visit: Payer: Managed Care, Other (non HMO)

## 2018-12-20 ENCOUNTER — Telehealth: Payer: Self-pay

## 2018-12-20 ENCOUNTER — Encounter: Payer: Self-pay | Admitting: *Deleted

## 2018-12-20 ENCOUNTER — Encounter: Payer: Self-pay | Admitting: Unknown Physician Specialty

## 2018-12-20 VITALS — BP 115/73 | HR 90 | Temp 98.6°F | Ht 67.0 in | Wt 150.0 lb

## 2018-12-20 DIAGNOSIS — Z1211 Encounter for screening for malignant neoplasm of colon: Secondary | ICD-10-CM

## 2018-12-20 DIAGNOSIS — M7712 Lateral epicondylitis, left elbow: Secondary | ICD-10-CM

## 2018-12-20 DIAGNOSIS — E1129 Type 2 diabetes mellitus with other diabetic kidney complication: Secondary | ICD-10-CM

## 2018-12-20 DIAGNOSIS — N400 Enlarged prostate without lower urinary tract symptoms: Secondary | ICD-10-CM

## 2018-12-20 DIAGNOSIS — I129 Hypertensive chronic kidney disease with stage 1 through stage 4 chronic kidney disease, or unspecified chronic kidney disease: Secondary | ICD-10-CM

## 2018-12-20 DIAGNOSIS — E782 Mixed hyperlipidemia: Secondary | ICD-10-CM

## 2018-12-20 DIAGNOSIS — IMO0002 Reserved for concepts with insufficient information to code with codable children: Secondary | ICD-10-CM

## 2018-12-20 LAB — MICROALBUMIN, URINE WAIVED
Creatinine, Urine Waived: 200 mg/dL (ref 10–300)
Microalb, Ur Waived: 80 mg/L — ABNORMAL HIGH (ref 0–19)

## 2018-12-20 LAB — BAYER DCA HB A1C WAIVED: HB A1C (BAYER DCA - WAIVED): 7.3 % — ABNORMAL HIGH (ref <7.0)

## 2018-12-20 MED ORDER — NA SULFATE-K SULFATE-MG SULF 17.5-3.13-1.6 GM/177ML PO SOLN: 354.0000 mL | Freq: Once | ORAL | 0 refills | Status: AC

## 2018-12-20 NOTE — Patient Instructions (Signed)
Tennis Elbow    Tennis elbow (lateral epicondylitis) is inflammation of tendons in your outer forearm, near your elbow. Tendons are tissues that connect muscle to bone. When you have tennis elbow, inflammation affects the tendons that you use to bend your wrist and move your hand up. Inflammation occurs in the lower part of the upper arm bone (humerus), where the tendons connect to the bone (lateral epicondyle).  Tennis elbow often affects people who play tennis, but anyone may get the condition from repeatedly extending the wrist or turning the forearm.  What are the causes?  This condition is usually caused by repeatedly extending the wrist, turning the forearm, and using the hands. It can result from sports or work that requires repetitive forearm movements. In some cases, it may be caused by a sudden injury.  What increases the risk?  You are more likely to develop tennis elbow if you play tennis or another racket sport. You also have a higher risk if you frequently use your hands for work. Besides people who play tennis, others at greater risk include:  Musicians.  Carpenters, painters, and plumbers.  Cooks.  Cashiers.  People who work in factories.  Construction workers.  Butchers.  People who use computers.  What are the signs or symptoms?  Symptoms of this condition include:  Pain and tenderness in the forearm and the outer part of the elbow. Pain may be felt only when using the arm, or it may be there all the time.  A burning feeling that starts in the elbow and spreads down the forearm.  A weak grip in the hand.  How is this diagnosed?  This condition may be diagnosed based on:  Your symptoms and medical history.  A physical exam.  X-rays.  MRI.  How is this treated?  Resting and icing your arm is often the first treatment. Your health care provider may also recommend:  Medicines to reduce pain and inflammation. These may be in the form of a pill, topical gels, or shots of a steroid medicine  (cortisone).  An elbow strap to reduce stress on the area.  Physical therapy. This may include massage or exercises.  An elbow brace to restrict the movements that cause symptoms.  If these treatments do not help relieve your symptoms, your health care provider may recommend surgery to remove damaged muscle and reattach healthy muscle to bone.  Follow these instructions at home:  Activity  Rest your elbow and wrist and avoid activities that cause symptoms, as told by your health care provider.  Do physical therapy exercises as instructed.  If you lift an object, lift it with your palm facing up. This reduces stress on your elbow.  Lifestyle  If your tennis elbow is caused by sports, check your equipment and make sure that:  You are using it correctly.  It is the best fit for you.  If your tennis elbow is caused by work or computer use, take frequent breaks to stretch your arm. Talk with your manager about ways to manage your condition at work.  If you have a brace:  Wear the brace or strap as told by your health care provider. Remove it only as told by your health care provider.  Loosen the brace if your fingers tingle, become numb, or turn cold and blue.  Keep the brace clean.  If the brace is not waterproof, ask if you may remove it for bathing. If you must keep the brace on while bathing:    bath or a shower. General instructions   If directed, put ice on the painful area: ? Put ice in a plastic bag. ? Place a towel between your skin and the bag. ? Leave the ice on for 20 minutes, 2-3 times a day.  Take over-the-counter and prescription medicines only as told by your health care provider.  Keep all follow-up visits as told by your health care provider. This is important. Contact a health care provider if:  You have pain that gets worse or does not get better with  treatment.  You have numbness or weakness in your forearm, hand, or fingers. Summary  Tennis elbow (lateral epicondylitis) is inflammation of tendons in your outer forearm, near your elbow.  Common symptoms include pain and tenderness in your forearm and the outer part of your elbow.  This condition is usually caused by repeatedly extending your wrist, turning your forearm, and using your hands.  The first treatment is often resting and icing your arm to relieve symptoms. Further treatment may include taking medicine, getting physical therapy, wearing a brace or strap, or having surgery. This information is not intended to replace advice given to you by your health care provider. Make sure you discuss any questions you have with your health care provider. Document Released: 12/22/2004 Document Revised: 09/17/2017 Document Reviewed: 10/06/2016 Elsevier Patient Education  2020 [employer] Ask your health care provider which exercises are safe for you. Do exercises exactly as told by your health care provider and adjust them as directed. It is normal to feel mild stretching, pulling, tightness, or discomfort as you do these exercises. Stop right away if you feel sudden pain or your pain gets worse. Do not begin these exercises until told by your health care provider. Stretching and range-of-motion exercises These exercises warm up your muscles and joints and improve the movement and flexibility of your elbow. These exercises also help to relieve pain, numbness, and tingling. Wrist flexion, assisted  1. Straighten your left / right elbow in front of you with your palm facing down toward the floor. ? If told by your health care provider, bend your left / right elbow to a 90-degree angle (right angle) at your side. 2. With your other hand, gently push over the back of your left / right hand so your fingers point toward the floor (flexion). Stop when you feel a gentle stretch  on the back of your forearm. 3. Hold this position for __________ seconds. Repeat __________ times. Complete this exercise __________ times a day. Wrist extension, assisted  1. Straighten your left / right elbow in front of you with your palm facing up toward the ceiling. ? If told by your health care provider, bend your left / right elbow to a 90-degree angle (right angle) at your side. 2. With your other hand, gently pull your left / right hand and fingers toward the floor (extension). Stop when you feel a gentle stretch on the palm side of your forearm. 3. Hold this position for __________ seconds. Repeat __________ times. Complete this exercise __________ times a day. Assisted forearm rotation, supination 1. Sit or stand with your left / right elbow bent to a 90-degree angle (right angle) at your side. 2. Using your uninjured hand, turn (rotate) your left / right palm up toward the ceiling (supination) until you feel a gentle stretch along the inside of your forearm. 3. Hold this position for __________ seconds. Repeat __________ times. Complete this exercise __________  times a day. Assisted forearm rotation, pronation 1. Sit or stand with your left / right elbow bent to a 90-degree angle (right angle) at your side. 2. Using your uninjured hand, rotate your left / right palm down toward the floor (pronation) until you feel a gentle stretch along the outside of your forearm. 3. Hold this position for __________ seconds. Repeat __________ times. Complete this exercise __________ times a day. Strengthening exercises These exercises build strength and endurance in your forearm and elbow. Endurance is the ability to use your muscles for a long time, even after they get tired. Radial deviation  1. Stand with a __________ weight or a hammer in your left / right hand. Or, sit while holding a rubber exercise band or tubing, with your left / right forearm supported on a table or countertop. ? If  you are standing, position your forearm so that your thumb is facing forward. If you are sitting, position your forearm so that the thumb is facing the ceiling. This is the neutral position. 2. Raise your hand upward in front of you so your thumb moves toward the ceiling (radial deviation), or pull up on the rubber tubing. Keep your forearm and elbow still while you move your wrist only. 3. Hold this position for __________ seconds. 4. Slowly return to the starting position. Repeat __________ times. Complete this exercise __________ times a day. Wrist extension, eccentric 1. Sit with your left / right forearm palm-down and supported on a table or other surface. Let your left / right wrist extend over the edge of the surface. 2. Hold a __________ weight or a piece of exercise band or tubing in your left / right hand. ? If using a rubber exercise band or tubing, hold the other end of the tubing with your other hand. 3. Use your uninjured hand to move your left / right hand up toward the ceiling. 4. Take your uninjured hand away and slowly return to the starting position using only your left / right hand. Lowering your arm under tension is called eccentric extension. Repeat __________ times. Complete this exercise __________ times a day. Wrist extension Do not do this exercise if it causes pain at the outside of your elbow. Only do this exercise once instructed by your health care provider. 1. Sit with your left / right forearm supported on a table or other surface and your palm turned down toward the floor. Let your left / right wrist extend over the edge of the surface. 2. Hold a __________ weight or a piece of rubber exercise band or tubing. ? If you are using a rubber exercise band or tubing, hold the band or tubing in place with your other hand to provide resistance. 3. Slowly bend your wrist so your hand moves up toward the ceiling (extension). Move only your wrist, keeping your forearm and elbow  still. 4. Hold this position for __________ seconds. 5. Slowly return to the starting position. Repeat __________ times. Complete this exercise __________ times a day. Forearm rotation, supination To do this exercise, you will need a lightweight hammer or rubber mallet. 1. Sit with your left / right forearm supported on a table or other surface. Bend your elbow to a 90-degree angle (right angle). Position your forearm so that your palm is facing down toward the floor, with your hand resting over the edge of the table. 2. Hold a hammer in your left / right hand. ? To make this exercise easier, hold the hammer  near the head of the hammer. ? To make this exercise harder, hold the hammer near the end of the handle. 3. Without moving your wrist or elbow, slowly rotate your forearm so your palm faces up toward the ceiling (supination). 4. Hold this position for __________ seconds. 5. Slowly return to the starting position. Repeat __________ times. Complete this exercise __________ times a day. Shoulder blade squeeze 1. Sit in a stable chair or stand with good posture. If you are sitting down, do not let your back touch the back of the chair. 2. Your arms should be at your sides with your elbows bent to a 90-degree angle (right angle). Position your forearms so that your thumbs are facing the ceiling (neutral position). 3. Without lifting your shoulders up, squeeze your shoulder blades tightly together. 4. Hold this position for __________ seconds. 5. Slowly release and return to the starting position. Repeat __________ times. Complete this exercise __________ times a day. This information is not intended to replace advice given to you by your health care provider. Make sure you discuss any questions you have with your health care provider. Document Released: 12/22/2004 Document Revised: 04/14/2018 Document Reviewed: 02/15/2018 Elsevier Patient Education  2020 [employer].

## 2018-12-20 NOTE — Telephone Encounter (Signed)
Gastroenterology Pre-Procedure Review    PATIENT REVIEW QUESTIONS: The patient responded to the following health history questions as indicated:    1. Are you having any GI issues? no 2. Do you have a personal history of Polyps? yes (at 73) 3. Do you have a family history of Colon Cancer or Polyps? no 4. Diabetes Mellitus? yes (Type 2) 5. Joint replacements in the past 12 months?no 6. Major health problems in the past 3 months?no 7. Any artificial heart valves, MVP, or defibrillator?no    MEDICATIONS & ALLERGIES:    Patient reports the following regarding taking any anticoagulation/antiplatelet therapy:   Plavix, Coumadin, Eliquis, Xarelto, Lovenox, Pradaxa, Brilinta, or Effient? no Aspirin? no  Patient confirms/reports the following medications:  Current Outpatient Medications  Medication Sig Dispense Refill  . atorvastatin (LIPITOR) 40 MG tablet Take 2 tablets (80 mg total) by mouth daily. 180 tablet 1  . empagliflozin (JARDIANCE) 25 MG TABS tablet Take 25 mg by mouth daily. 90 tablet 1  . fluticasone (FLONASE) 50 MCG/ACT nasal spray Place 2 sprays into both nostrils daily. 16 g 6  . glucose blood test strip 1 each by Other route as needed for other. One touch ultra test strips. Use as instructed. 100 each 12  . Insulin Degludec-Liraglutide (XULTOPHY) 100-3.6 UNIT-MG/ML SOPN Inject 50 Units into the skin at bedtime. 45 mL 1  . Insulin Pen Needle (NOVOFINE PLUS) 32G X 4 MM MISC Use one needle daily with insulin 100 each 4  . lisinopril (ZESTRIL) 10 MG tablet Take 1.5 tablets (15 mg total) by mouth daily. 135 tablet 1  . metFORMIN (GLUCOPHAGE) 1000 MG tablet TAKE 1 TABLET BY MOUTH TWICE DAILY 180 tablet 1  . montelukast (SINGULAIR) 10 MG tablet Take 1 tablet (10 mg total) by mouth at bedtime. 90 tablet 1  . ondansetron (ZOFRAN) 4 MG tablet ondansetron HCl 4 mg tablet    . oxyCODONE-acetaminophen (PERCOCET/ROXICET) 5-325 MG tablet Take 1 tablet by mouth every 6 (six) hours.    .  sildenafil (VIAGRA) 100 MG tablet Take 0.5-1 tablets (50-100 mg total) by mouth daily as needed for erectile dysfunction. 5 tablet 11   No current facility-administered medications for this visit.    Patient confirms/reports the following allergies:  Allergies  Allergen Reactions  . Codeine   . Hydrocodone Nausea Only  . Oxycodone Nausea Only    No orders of the defined types were placed in this encounter.   AUTHORIZATION INFORMATION Primary Insurance: 1D#: Group #:  Secondary Insurance: 1D#: Group #:  SCHEDULE INFORMATION: Date: 01/12/2019 Time: Location:[facility]

## 2018-12-20 NOTE — Progress Notes (Signed)
BP 115/73 (BP Location: Left Arm, Patient Position: Sitting, Cuff Size: Normal)   Pulse 90   Temp 98.6 F (37 C) (Oral)   Ht 5\' 7"  (1.702 m)   Wt 150 lb (68 kg)   SpO2 96%   BMI 23.49 kg/m    Subjective:    Patient ID: Arthur [name], male    DOB: 06/17/1965, 53 y.o.   MRN: [id number]  HPI: Arthur [name] is a 53 y.o. male  Chief Complaint  Patient presents with  . Arm Pain    Limb fell and hit arm appx 1 month ago. Patient states a vein in his arm is large and feels like a rope. Area is swollen and painful.    Pt with continued forearm pain following the injury above.  States making a fist is more more painful and continued movement.  No change in color or temperature.    Relevant past medical, surgical, family and social history reviewed and updated as indicated. Interim medical history since our last visit reviewed. Allergies and medications reviewed and updated.  Review of Systems  Per HPI unless specifically indicated above     Objective:    BP 115/73 (BP Location: Left Arm, Patient Position: Sitting, Cuff Size: Normal)   Pulse 90   Temp 98.6 F (37 C) (Oral)   Ht 5\' 7"  (1.702 m)   Wt 150 lb (68 kg)   SpO2 96%   BMI 23.49 kg/m   Wt Readings from Last 3 Encounters:  12/20/18 150 lb (68 kg)  08/01/18 160 lb (72.6 kg)  06/24/18 159 lb 9.6 oz (72.4 kg)    Physical Exam Constitutional:      General: He is not in acute distress.    Appearance: Normal appearance. He is well-developed.  HENT:     Head: Normocephalic and atraumatic.  Eyes:     General: Lids are normal. No scleral icterus.       Right eye: No discharge.        Left eye: No discharge.     Conjunctiva/sclera: Conjunctivae normal.  Cardiovascular:     Rate and Rhythm: Normal rate.  Pulmonary:     Effort: Pulmonary effort is normal.  Abdominal:     Palpations: There is no hepatomegaly or splenomegaly.  Musculoskeletal:        General: Normal range of motion.     Left elbow: No swelling,  deformity, effusion or lacerations. Normal range of motion. Tenderness present in lateral epicondyle.  Skin:    Coloration: Skin is not pale.     Findings: No rash.  Neurological:     Mental Status: He is alert and oriented to person, place, and time.  Psychiatric:        Behavior: Behavior normal.        Thought Content: Thought content normal.        Judgment: Judgment normal.     Results for orders placed or performed in visit on 09/01/18  Bayer DCA Hb A1c Waived  Result Value Ref Range   HB A1C (BAYER DCA - WAIVED) 7.3 (H) <7.0 %  CBC with Differential/Platelet  Result Value Ref Range   WBC 5.3 3.4 - 10.8 x10E3/uL   RBC 4.71 4.14 - 5.80 x10E6/uL   Hemoglobin 12.5 (L) 13.0 - 17.7 g/dL   Hematocrit 39.1 37.5 - 51.0 %   MCV 83 79 - 97 fL   MCH 26.5 (L) 26.6 - 33.0 pg   MCHC 32.0 31.5 - 35.7  g/dL   RDW 14.0 11.6 - 15.4 %   Platelets 323 150 - 450 x10E3/uL   Neutrophils 47 Not Estab. %   Lymphs 38 Not Estab. %   Monocytes 11 Not Estab. %   Eos 4 Not Estab. %   Basos 0 Not Estab. %   Neutrophils Absolute 2.4 1.4 - 7.0 x10E3/uL   Lymphocytes Absolute 2.0 0.7 - 3.1 x10E3/uL   Monocytes Absolute 0.6 0.1 - 0.9 x10E3/uL   EOS (ABSOLUTE) 0.2 0.0 - 0.4 x10E3/uL   Basophils Absolute 0.0 0.0 - 0.2 x10E3/uL   Immature Granulocytes 0 Not Estab. %   Immature Grans (Abs) 0.0 0.0 - 0.1 x10E3/uL  Comprehensive metabolic panel  Result Value Ref Range   Glucose 62 (L) 65 - 99 mg/dL   BUN 11 6 - 24 mg/dL   Creatinine, Ser 1.20 0.76 - 1.27 mg/dL   GFR calc non Af Amer 68 >59 mL/min/1.73   GFR calc Af Amer 78 >59 mL/min/1.73   BUN/Creatinine Ratio 9 9 - 20   Sodium 142 134 - 144 mmol/L   Potassium 5.3 (H) 3.5 - 5.2 mmol/L   Chloride 105 96 - 106 mmol/L   CO2 23 20 - 29 mmol/L   Calcium 10.0 8.7 - 10.2 mg/dL   Total Protein 6.4 6.0 - 8.5 g/dL   Albumin 4.2 3.8 - 4.9 g/dL   Globulin, Total 2.2 1.5 - 4.5 g/dL   Albumin/Globulin Ratio 1.9 1.2 - 2.2   Bilirubin Total 0.3 0.0 - 1.2 mg/dL    Alkaline Phosphatase 91 39 - 117 IU/L   AST 23 0 - 40 IU/L   ALT 15 0 - 44 IU/L  CoaguChek XS/INR Waived  Result Value Ref Range   INR 1.0 0.9 - 1.1   Prothrombin Time 11.8 sec      Assessment & Plan:   Problem List Items Addressed This Visit    None    Visit Diagnoses    Lateral epicondylitis of left elbow    -  Primary   Exercises given.  Tennis elbow brace. OTC NSAIDs.  Consider topical NSAIDs if continued problem       Follow up plan: Return if symptoms worsen or fail to improve.

## 2018-12-21 LAB — CBC WITH DIFFERENTIAL/PLATELET
Basophils Absolute: 0 10*3/uL (ref 0.0–0.2)
Basos: 1 %
EOS (ABSOLUTE): 0 10*3/uL (ref 0.0–0.4)
Eos: 1 %
Hematocrit: 38.4 % (ref 37.5–51.0)
Hemoglobin: 12.8 g/dL — ABNORMAL LOW (ref 13.0–17.7)
Immature Grans (Abs): 0 10*3/uL (ref 0.0–0.1)
Immature Granulocytes: 0 %
Lymphocytes Absolute: 1.4 10*3/uL (ref 0.7–3.1)
Lymphs: 33 %
MCH: 26.2 pg — ABNORMAL LOW (ref 26.6–33.0)
MCHC: 33.3 g/dL (ref 31.5–35.7)
MCV: 79 fL (ref 79–97)
Monocytes Absolute: 0.8 10*3/uL (ref 0.1–0.9)
Monocytes: 18 %
Neutrophils Absolute: 2 10*3/uL (ref 1.4–7.0)
Neutrophils: 47 %
Platelets: 270 10*3/uL (ref 150–450)
RBC: 4.89 x10E6/uL (ref 4.14–5.80)
RDW: 15.6 % — ABNORMAL HIGH (ref 11.6–15.4)
WBC: 4.3 10*3/uL (ref 3.4–10.8)

## 2018-12-21 LAB — COMPREHENSIVE METABOLIC PANEL
ALT: 20 IU/L (ref 0–44)
AST: 22 IU/L (ref 0–40)
Albumin/Globulin Ratio: 2 (ref 1.2–2.2)
Albumin: 4.3 g/dL (ref 3.8–4.9)
Alkaline Phosphatase: 99 IU/L (ref 39–117)
BUN/Creatinine Ratio: 11 (ref 9–20)
BUN: 17 mg/dL (ref 6–24)
Bilirubin Total: <0.2 mg/dL (ref 0.0–1.2)
CO2: 21 mmol/L (ref 20–29)
Calcium: 9 mg/dL (ref 8.7–10.2)
Chloride: 105 mmol/L (ref 96–106)
Creatinine, Ser: 1.53 mg/dL — ABNORMAL HIGH (ref 0.76–1.27)
GFR calc Af Amer: 58 mL/min/{1.73_m2} — ABNORMAL LOW (ref >59)
GFR calc non Af Amer: 50 mL/min/{1.73_m2} — ABNORMAL LOW (ref >59)
Globulin, Total: 2.1 g/dL (ref 1.5–4.5)
Glucose: 127 mg/dL — ABNORMAL HIGH (ref 65–99)
Potassium: 4.6 mmol/L (ref 3.5–5.2)
Sodium: 141 mmol/L (ref 134–144)
Total Protein: 6.4 g/dL (ref 6.0–8.5)

## 2018-12-21 LAB — LIPID PANEL W/O CHOL/HDL RATIO
Cholesterol, Total: 129 mg/dL (ref 100–199)
HDL: 36 mg/dL — ABNORMAL LOW (ref >39)
LDL Chol Calc (NIH): 59 mg/dL (ref 0–99)
Triglycerides: 204 mg/dL — ABNORMAL HIGH (ref 0–149)
VLDL Cholesterol Cal: 34 mg/dL (ref 5–40)

## 2018-12-21 LAB — PSA: Prostate Specific Ag, Serum: 0.8 ng/mL (ref 0.0–4.0)

## 2018-12-22 ENCOUNTER — Ambulatory Visit: Payer: Self-pay

## 2018-12-22 NOTE — Telephone Encounter (Signed)
Pt. Reports he started feeling bad yesterday. States he was tested for CVOID 19 today at [facility]. Has chills, body aches, chest feels raw when coughing, diarrhea. Virtual appointment made for tomorrow. Instructed to take Tylenol for body aches and stay hydrated. Instructed if his symptoms worsened and had difficulty breathing, go to ED. Verbalizes understanding.  Answer Assessment - Initial Assessment Questions 1. COVID-19 DIAGNOSIS: "Who made your Coronavirus (COVID-19) diagnosis?" "Was it confirmed by a positive lab test?" If not diagnosed by a HCP, ask "Are there lots of cases (community spread) where you live?" (See public health department website, if unsure)     Tested at [facility] 2. COVID-19 EXPOSURE: "Was there any known exposure to COVID before the symptoms began?" CDC Definition of close contact: within 6 feet (2 meters) for a total of 15 minutes or more over a 24-hour period.      No 3. ONSET: "When did the COVID-19 symptoms start?"      Started yesterday 4. WORST SYMPTOM: "What is your worst symptom?" (e.g., cough, fever, shortness of breath, muscle aches)     Chest feels "raw" 5. COUGH: "Do you have a cough?" If so, ask: "How bad is the cough?"       Yes - Mild 6. FEVER: "Do you have a fever?" If so, ask: "What is your temperature, how was it measured, and when did it start?"     No 7. RESPIRATORY STATUS: "Describe your breathing?" (e.g., shortness of breath, wheezing, unable to speak)      Cough makes chest feel tight 8. BETTER-SAME-WORSE: "Are you getting better, staying the same or getting worse compared to yesterday?"  If getting worse, ask, "In what way?"     Better 9. HIGH RISK DISEASE: "Do you have any chronic medical problems?" (e.g., asthma, heart or lung disease, weak immune system, obesity, etc.)     Diabetic 10. PREGNANCY: "Is there any chance you are pregnant?" "When was your last menstrual period?"       n/a 11. OTHER SYMPTOMS: "Do you have any other  symptoms?"  (e.g., chills, fatigue, headache, loss of smell or taste, muscle pain, sore throat; new loss of smell or taste especially support the diagnosis of COVID-19)       Chills, diarrhea, body aches, runny nose, cough  Protocols used: CORONAVIRUS (COVID-19) DIAGNOSED OR SUSPECTED-A-AH

## 2018-12-23 ENCOUNTER — Telehealth: Payer: Self-pay | Admitting: Family Medicine

## 2018-12-23 ENCOUNTER — Other Ambulatory Visit: Payer: Self-pay

## 2018-12-23 ENCOUNTER — Encounter: Payer: Self-pay | Admitting: Family Medicine

## 2018-12-23 ENCOUNTER — Ambulatory Visit (INDEPENDENT_AMBULATORY_CARE_PROVIDER_SITE_OTHER): Payer: Managed Care, Other (non HMO) | Admitting: Family Medicine

## 2018-12-23 DIAGNOSIS — Z20828 Contact with and (suspected) exposure to other viral communicable diseases: Secondary | ICD-10-CM

## 2018-12-23 DIAGNOSIS — Z20822 Contact with and (suspected) exposure to covid-19: Secondary | ICD-10-CM

## 2018-12-23 MED ORDER — BENZONATATE 100 MG PO CAPS: 100.0000 mg | ORAL_CAPSULE | Freq: Two times a day (BID) | ORAL | 0 refills | Status: DC | PRN

## 2018-12-23 NOTE — Telephone Encounter (Signed)
Copied from [place] [phone]. Topic: General - Other >> Dec 22, 2018 10:47 AM Lennox Solders wrote: Reason for CRM: pt is calling to let dr Wynetta Emery know he took covid 19 test and he is a dm and was told to contact his doctor. Pt is having runny nose and had chills and diarrhea last night. Pt is having some chest discomfort when he cough no sob.  Call was transferred to nurse triage

## 2018-12-23 NOTE — Telephone Encounter (Signed)
Patient seen this AM.

## 2018-12-23 NOTE — Progress Notes (Signed)
There were no vitals taken for this visit.   Subjective:    Patient ID: Arthur [name], male    DOB: 1965-03-02, 53 y.o.   MRN: [id number]  HPI: Arthur [name] is a 53 y.o. male  Chief Complaint  Patient presents with  . Chills    Patient got COVID test yesterday.   . Cough  . Generalized Body Aches  . Diarrhea   UPPER RESPIRATORY TRACT INFECTION Duration: 12/20/18 Worst symptom: chills  Fever: yes- low grade Cough: yes Shortness of breath: yes Wheezing: no Chest pain: no Chest tightness: yes Chest congestion: yes Nasal congestion: yes Runny nose: yes Post nasal drip: yes Sneezing: no Sore throat: yes Swollen glands: no Sinus pressure: yes Headache: yes Face pain: no Toothache: no Ear pain: no  Ear pressure: no  Eyes red/itching:no Eye drainage/crusting: no  Vomiting: no  Diarrhea: yes Rash: no Fatigue: yes Sick contacts: no Strep contacts: no  Context: better Recurrent sinusitis: no Relief with OTC cold/cough medications: no  Treatments attempted: tylenol, nyquil,    Relevant past medical, surgical, family and social history reviewed and updated as indicated. Interim medical history since our last visit reviewed. Allergies and medications reviewed and updated.  Review of Systems  Constitutional: Negative.   HENT: Positive for congestion, postnasal drip and rhinorrhea. Negative for dental problem, drooling, ear discharge, ear pain, facial swelling, hearing loss, mouth sores, nosebleeds, sinus pressure, sinus pain, sneezing, sore throat, tinnitus, trouble swallowing and voice change.   Respiratory: Positive for chest tightness. Negative for apnea, cough, choking, shortness of breath, wheezing and stridor.   Cardiovascular: Negative.   Musculoskeletal: Positive for myalgias. Negative for arthralgias, back pain, gait problem, joint swelling, neck pain and neck stiffness.  Skin: Negative.   Neurological: Negative.   Psychiatric/Behavioral: Negative.      Per HPI unless specifically indicated above     Objective:    There were no vitals taken for this visit.  Wt Readings from Last 3 Encounters:  12/20/18 150 lb (68 kg)  08/01/18 160 lb (72.6 kg)  06/24/18 159 lb 9.6 oz (72.4 kg)    Physical Exam Vitals and nursing note reviewed.  Constitutional:      General: He is not in acute distress.    Appearance: Normal appearance. He is not ill-appearing, toxic-appearing or diaphoretic.  HENT:     Head: Normocephalic and atraumatic.     Right Ear: External ear normal.     Left Ear: External ear normal.     Nose: Nose normal.     Mouth/Throat:     Mouth: Mucous membranes are moist.     Pharynx: Oropharynx is clear.  Eyes:     General: No scleral icterus.       Right eye: No discharge.        Left eye: No discharge.     Conjunctiva/sclera: Conjunctivae normal.     Pupils: Pupils are equal, round, and reactive to light.  Pulmonary:     Effort: Pulmonary effort is normal. No respiratory distress.     Comments: Speaking in full sentences Musculoskeletal:        General: Normal range of motion.     Cervical back: Normal range of motion.  Skin:    Coloration: Skin is not jaundiced or pale.     Findings: No bruising, erythema, lesion or rash.  Neurological:     Mental Status: He is alert and oriented to person, place, and time. Mental status is at baseline.  Psychiatric:        Mood and Affect: Mood normal.        Behavior: Behavior normal.        Thought Content: Thought content normal.        Judgment: Judgment normal.     Results for orders placed or performed in visit on 12/20/18  Microalbumin, Urine Waived  Result Value Ref Range   Microalb, Ur Waived 80 (H) 0 - 19 mg/L   Creatinine, Urine Waived 200 10 - 300 mg/dL   Microalb/Creat Ratio 30-300 (H) <30 mg/g  Bayer DCA Hb A1c Waived  Result Value Ref Range   HB A1C (BAYER DCA - WAIVED) 7.3 (H) <7.0 %  CBC with Differential OUT  Result Value Ref Range   WBC 4.3 3.4 -  10.8 x10E3/uL   RBC 4.89 4.14 - 5.80 x10E6/uL   Hemoglobin 12.8 (L) 13.0 - 17.7 g/dL   Hematocrit 38.4 37.5 - 51.0 %   MCV 79 79 - 97 fL   MCH 26.2 (L) 26.6 - 33.0 pg   MCHC 33.3 31.5 - 35.7 g/dL   RDW 15.6 (H) 11.6 - 15.4 %   Platelets 270 150 - 450 x10E3/uL   Neutrophils 47 Not Estab. %   Lymphs 33 Not Estab. %   Monocytes 18 Not Estab. %   Eos 1 Not Estab. %   Basos 1 Not Estab. %   Neutrophils Absolute 2.0 1.4 - 7.0 x10E3/uL   Lymphocytes Absolute 1.4 0.7 - 3.1 x10E3/uL   Monocytes Absolute 0.8 0.1 - 0.9 x10E3/uL   EOS (ABSOLUTE) 0.0 0.0 - 0.4 x10E3/uL   Basophils Absolute 0.0 0.0 - 0.2 x10E3/uL   Immature Granulocytes 0 Not Estab. %   Immature Grans (Abs) 0.0 0.0 - 0.1 x10E3/uL  PSA  Result Value Ref Range   Prostate Specific Ag, Serum 0.8 0.0 - 4.0 ng/mL  Lipid Panel w/o Chol/HDL Ratio OUT  Result Value Ref Range   Cholesterol, Total 129 100 - 199 mg/dL   Triglycerides 204 (H) 0 - 149 mg/dL   HDL 36 (L) >39 mg/dL   VLDL Cholesterol Cal 34 5 - 40 mg/dL   LDL Chol Calc (NIH) 59 0 - 99 mg/dL  Comp Met (CMET)  Result Value Ref Range   Glucose 127 (H) 65 - 99 mg/dL   BUN 17 6 - 24 mg/dL   Creatinine, Ser 1.53 (H) 0.76 - 1.27 mg/dL   GFR calc non Af Amer 50 (L) >59 mL/min/1.73   GFR calc Af Amer 58 (L) >59 mL/min/1.73   BUN/Creatinine Ratio 11 9 - 20   Sodium 141 134 - 144 mmol/L   Potassium 4.6 3.5 - 5.2 mmol/L   Chloride 105 96 - 106 mmol/L   CO2 21 20 - 29 mmol/L   Calcium 9.0 8.7 - 10.2 mg/dL   Total Protein 6.4 6.0 - 8.5 g/dL   Albumin 4.3 3.8 - 4.9 g/dL   Globulin, Total 2.1 1.5 - 4.5 g/dL   Albumin/Globulin Ratio 2.0 1.2 - 2.2   Bilirubin Total <0.2 0.0 - 1.2 mg/dL   Alkaline Phosphatase 99 39 - 117 IU/L   AST 22 0 - 40 IU/L   ALT 20 0 - 44 IU/L      Assessment & Plan:   Problem List Items Addressed This Visit    None    Visit Diagnoses    Suspected COVID-19 virus infection    -  Primary   Mild symotoms now. Tessalon for cough. Self-quarantine  until  results back. Call with any concerns. Continue to monitor.    Relevant Orders   Temperature monitoring       Follow up plan: Return if symptoms worsen or fail to improve.   . This visit was completed via Doximity due to the restrictions of the COVID-19 pandemic. All issues as above were discussed and addressed. Physical exam was done as above through visual confirmation on Doximity. If it was felt that the patient should be evaluated in the office, they were directed there. The patient verbally consented to this visit. . Location of the patient: home . Location of the provider: work . Those involved with this call:  . Provider: Park Liter, DO . CMA: Gerda Diss, CMA . Front Desk/Registration: Don Perking  . Time spent on call: 15 minutes with patient face to face via video conference. More than 50% of this time was spent in counseling and coordination of care. 23 minutes total spent in review of patient's record and preparation of their chart.

## 2018-12-29 ENCOUNTER — Other Ambulatory Visit: Payer: Self-pay | Admitting: Family Medicine

## 2018-12-29 NOTE — Telephone Encounter (Signed)
6 months called in on 12/1

## 2018-12-29 NOTE — Telephone Encounter (Signed)
Patient notified about Rx refill request. Pt stated he did not request the refill. While on the phone, patient stated his cough has not gotten better. Patient is asking for something stronger for his cough. Please advise

## 2018-12-29 NOTE — Telephone Encounter (Signed)
Forwarding medication refill requests to PCP for review. 

## 2019-01-03 ENCOUNTER — Telehealth: Payer: Self-pay | Admitting: Family Medicine

## 2019-01-03 NOTE — Telephone Encounter (Signed)
Copied from [place] [id number]. Topic: Quick Communication - Rx Refill/Question >> Jan 03, 2019 12:08 PM Erick Blinks wrote: [phone]  Pt called requesting stronger cough medicine than what has been prescribed, he is covid 19 + Please advise

## 2019-01-03 NOTE — Telephone Encounter (Signed)
Pt, understood and verbalized understanding made appointment for 01/05/2019 [facility]

## 2019-01-03 NOTE — Telephone Encounter (Signed)
Please schedule VIRTUAL appointment with whoever is first available

## 2019-01-05 ENCOUNTER — Ambulatory Visit (INDEPENDENT_AMBULATORY_CARE_PROVIDER_SITE_OTHER): Payer: Managed Care, Other (non HMO) | Admitting: Family Medicine

## 2019-01-05 ENCOUNTER — Encounter: Payer: Self-pay | Admitting: Family Medicine

## 2019-01-05 ENCOUNTER — Other Ambulatory Visit: Payer: Self-pay

## 2019-01-05 VITALS — BP 128/87 | HR 76 | Ht 67.0 in | Wt 150.0 lb

## 2019-01-05 DIAGNOSIS — R059 Cough, unspecified: Secondary | ICD-10-CM

## 2019-01-05 DIAGNOSIS — R05 Cough: Secondary | ICD-10-CM

## 2019-01-05 DIAGNOSIS — I129 Hypertensive chronic kidney disease with stage 1 through stage 4 chronic kidney disease, or unspecified chronic kidney disease: Secondary | ICD-10-CM | POA: Diagnosis not present

## 2019-01-05 MED ORDER — HYDROCOD POLST-CPM POLST ER 10-8 MG/5ML PO SUER: 5.0000 mL | Freq: Every evening | ORAL | 0 refills | Status: DC | PRN

## 2019-01-05 MED ORDER — LOSARTAN POTASSIUM 25 MG PO TABS: 25.0000 mg | ORAL_TABLET | Freq: Every day | ORAL | 1 refills | Status: DC

## 2019-01-05 NOTE — Assessment & Plan Note (Signed)
Given persistence and duration, will d/c lisinopril and start low dose losartan in case ACEI cough though sxs are more consistent with sinus/post nasal drainage cough. Continue close home monitoring and f/u in 2 weeks in person for recheck

## 2019-01-05 NOTE — Progress Notes (Signed)
BP 128/87   Pulse 76   Ht '5\' 7"'$  (1.702 m)   Wt 150 lb (68 kg)   BMI 23.49 kg/m    Subjective:    Patient ID: Arthur [name], male    DOB: 1965-06-17, 53 y.o.   MRN: [id number]  HPI: Arthur [name] is a 52 y.o. male  Chief Complaint  Patient presents with  . Follow-up    patient wants stronger medication for cough  . Cough    . This visit was completed via WebEx due to the restrictions of the COVID-19 pandemic. All issues as above were discussed and addressed. Physical exam was done as above through visual confirmation on WebEx. If it was felt that the patient should be evaluated in the office, they were directed there. The patient verbally consented to this visit. . Location of the patient: home . Location of the provider: work . Those involved with this call:  . Provider: Merrie Roof, PA-C . CMA: Lesle Chris, [place] . Front Desk/Registration: Jill Side  . Time spent on call: 15 minutes with patient face to face via video conference. More than 50% of this time was spent in counseling and coordination of care. 5 minutes total spent in review of patient's record and preparation of their chart. I verified patient identity using two factors (patient name and date of birth). Patient consents verbally to being seen via telemedicine visit today.   Patient presenting today with persistent cough that has been ongoing for almost 2 years. Recent dx of COVID 19, finished quarantine earlier this week and feeling much better overall. Does not feel his cough was truly related to the COVID infection. Has been trying numerous allergy medications including flonase and singulair without much relief. Tessalon perles not helping either. States the cough is daily, worst at night but can happen all day. Dry, no wheezing or SOB.   Relevant past medical, surgical, family and social history reviewed and updated as indicated. Interim medical history since our last visit reviewed. Allergies and  medications reviewed and updated.  Review of Systems  Per HPI unless specifically indicated above     Objective:    BP 128/87   Pulse 76   Ht '5\' 7"'$  (1.702 m)   Wt 150 lb (68 kg)   BMI 23.49 kg/m   Wt Readings from Last 3 Encounters:  01/05/19 150 lb (68 kg)  12/20/18 150 lb (68 kg)  08/01/18 160 lb (72.6 kg)    Physical Exam Vitals and nursing note reviewed.  Constitutional:      General: He is not in acute distress.    Appearance: Normal appearance.  HENT:     Head: Atraumatic.     Right Ear: External ear normal.     Left Ear: External ear normal.     Nose: Nose normal. No congestion.     Mouth/Throat:     Mouth: Mucous membranes are moist.     Pharynx: Oropharynx is clear. Posterior oropharyngeal erythema present.  Eyes:     Extraocular Movements: Extraocular movements intact.     Conjunctiva/sclera: Conjunctivae normal.  Cardiovascular:     Rate and Rhythm: Normal rate and regular rhythm.  Pulmonary:     Effort: Pulmonary effort is normal. No respiratory distress.  Musculoskeletal:        General: Normal range of motion.     Cervical back: Normal range of motion.  Skin:    General: Skin is dry.     Findings: No erythema  or rash.  Neurological:     Mental Status: He is oriented to person, place, and time.  Psychiatric:        Mood and Affect: Mood normal.        Thought Content: Thought content normal.        Judgment: Judgment normal.     Results for orders placed or performed in visit on 12/20/18  Microalbumin, Urine Waived  Result Value Ref Range   Microalb, Ur Waived 80 (H) 0 - 19 mg/L   Creatinine, Urine Waived 200 10 - 300 mg/dL   Microalb/Creat Ratio 30-300 (H) <30 mg/g  Bayer DCA Hb A1c Waived  Result Value Ref Range   HB A1C (BAYER DCA - WAIVED) 7.3 (H) <7.0 %  CBC with Differential OUT  Result Value Ref Range   WBC 4.3 3.4 - 10.8 x10E3/uL   RBC 4.89 4.14 - 5.80 x10E6/uL   Hemoglobin 12.8 (L) 13.0 - 17.7 g/dL   Hematocrit 38.4 37.5 -  51.0 %   MCV 79 79 - 97 fL   MCH 26.2 (L) 26.6 - 33.0 pg   MCHC 33.3 31.5 - 35.7 g/dL   RDW 15.6 (H) 11.6 - 15.4 %   Platelets 270 150 - 450 x10E3/uL   Neutrophils 47 Not Estab. %   Lymphs 33 Not Estab. %   Monocytes 18 Not Estab. %   Eos 1 Not Estab. %   Basos 1 Not Estab. %   Neutrophils Absolute 2.0 1.4 - 7.0 x10E3/uL   Lymphocytes Absolute 1.4 0.7 - 3.1 x10E3/uL   Monocytes Absolute 0.8 0.1 - 0.9 x10E3/uL   EOS (ABSOLUTE) 0.0 0.0 - 0.4 x10E3/uL   Basophils Absolute 0.0 0.0 - 0.2 x10E3/uL   Immature Granulocytes 0 Not Estab. %   Immature Grans (Abs) 0.0 0.0 - 0.1 x10E3/uL  PSA  Result Value Ref Range   Prostate Specific Ag, Serum 0.8 0.0 - 4.0 ng/mL  Lipid Panel w/o Chol/HDL Ratio OUT  Result Value Ref Range   Cholesterol, Total 129 100 - 199 mg/dL   Triglycerides 204 (H) 0 - 149 mg/dL   HDL 36 (L) >39 mg/dL   VLDL Cholesterol Cal 34 5 - 40 mg/dL   LDL Chol Calc (NIH) 59 0 - 99 mg/dL  Comp Met (CMET)  Result Value Ref Range   Glucose 127 (H) 65 - 99 mg/dL   BUN 17 6 - 24 mg/dL   Creatinine, Ser 1.53 (H) 0.76 - 1.27 mg/dL   GFR calc non Af Amer 50 (L) >59 mL/min/1.73   GFR calc Af Amer 58 (L) >59 mL/min/1.73   BUN/Creatinine Ratio 11 9 - 20   Sodium 141 134 - 144 mmol/L   Potassium 4.6 3.5 - 5.2 mmol/L   Chloride 105 96 - 106 mmol/L   CO2 21 20 - 29 mmol/L   Calcium 9.0 8.7 - 10.2 mg/dL   Total Protein 6.4 6.0 - 8.5 g/dL   Albumin 4.3 3.8 - 4.9 g/dL   Globulin, Total 2.1 1.5 - 4.5 g/dL   Albumin/Globulin Ratio 2.0 1.2 - 2.2   Bilirubin Total <0.2 0.0 - 1.2 mg/dL   Alkaline Phosphatase 99 39 - 117 IU/L   AST 22 0 - 40 IU/L   ALT 20 0 - 44 IU/L      Assessment & Plan:   Problem List Items Addressed This Visit      Genitourinary   Benign hypertensive renal disease - Primary    Given persistence and duration, will  d/c lisinopril and start low dose losartan in case ACEI cough though sxs are more consistent with sinus/post nasal drainage cough. Continue close home  monitoring and f/u in 2 weeks in person for recheck       Other Visit Diagnoses    Cough       More likely allergy related, will add zyrtec to current regimen and increase flonase to BID but will also come off ACEI to r/o ACEI cough. Tussionex QHS prn       Follow up plan: Return in about 2 weeks (around 01/19/2019) for Cough, BP f/u.

## 2019-01-10 ENCOUNTER — Other Ambulatory Visit
Admission: RE | Admit: 2019-01-10 | Discharge: 2019-01-10 | Disposition: A | Discharge status: Home / Self Care | Payer: Managed Care, Other (non HMO) | Source: Ambulatory Visit | Attending: Gastroenterology | Admitting: Gastroenterology

## 2019-01-10 ENCOUNTER — Other Ambulatory Visit: Payer: Self-pay

## 2019-01-10 DIAGNOSIS — U071 COVID-19: Secondary | ICD-10-CM | POA: Insufficient documentation

## 2019-01-10 DIAGNOSIS — Z01812 Encounter for preprocedural laboratory examination: Secondary | ICD-10-CM | POA: Diagnosis present

## 2019-01-11 ENCOUNTER — Other Ambulatory Visit: Payer: Self-pay

## 2019-01-11 ENCOUNTER — Telehealth: Payer: Self-pay

## 2019-01-11 DIAGNOSIS — Z1211 Encounter for screening for malignant neoplasm of colon: Secondary | ICD-10-CM

## 2019-01-11 LAB — SARS CORONAVIRUS 2 (TAT 6-24 HRS): SARS Coronavirus 2: POSITIVE — AB

## 2019-01-11 MED ORDER — NA SULFATE-K SULFATE-MG SULF 17.5-3.13-1.6 GM/177ML PO SOLN: 354.0000 mL | Freq: Once | ORAL | 0 refills | Status: AC

## 2019-01-11 NOTE — Telephone Encounter (Signed)
Already informed patient. Patient was positive back in early December. Patient emailed me the results. Moved patient to 02/07/2019. Informed patient to contact pcp to inform them that he is still positive

## 2019-01-11 NOTE — Telephone Encounter (Signed)
-----   Message from Virgel Manifold, MD sent at 01/11/2019 10:40 AM EST ----- Caryl Pina please let the patient know, his Covid testing was positive.  He should call his primary care provider for further steps.  I am also CCing this result to them.  Please reschedule his procedure per protocol

## 2019-01-11 NOTE — Telephone Encounter (Signed)
Patient procedure was moved to 02/06/2018 since he was positive back in December for [place] and rested this week and is still positive . Patient emailed me his result and I faxed them to [facility] in [facility]

## 2019-01-12 ENCOUNTER — Ambulatory Visit
Admission: RE | Admit: 2019-01-12 | Payer: Managed Care, Other (non HMO) | Source: Home / Self Care | Admitting: Gastroenterology

## 2019-01-12 ENCOUNTER — Encounter: Admission: RE | Payer: Self-pay | Source: Home / Self Care

## 2019-01-12 SURGERY — COLONOSCOPY WITH PROPOFOL
Anesthesia: General

## 2019-01-17 ENCOUNTER — Telehealth: Payer: Self-pay

## 2019-01-17 NOTE — Telephone Encounter (Signed)
Patient was scheduled on 02/06/2018 with Vicente Males for a procedure moved patient to 01/25/2019 with Dr. Marius Ditch. Patient was informed and verbalized understanding

## 2019-01-23 ENCOUNTER — Other Ambulatory Visit: Admission: RE | Admit: 2019-01-23 | Payer: Managed Care, Other (non HMO) | Source: Ambulatory Visit

## 2019-01-24 ENCOUNTER — Telehealth (INDEPENDENT_AMBULATORY_CARE_PROVIDER_SITE_OTHER): Payer: Managed Care, Other (non HMO) | Admitting: Family Medicine

## 2019-01-24 ENCOUNTER — Encounter: Payer: Self-pay | Admitting: Family Medicine

## 2019-01-24 ENCOUNTER — Other Ambulatory Visit: Payer: Self-pay

## 2019-01-24 VITALS — BP 142/88 | Ht 67.0 in | Wt 150.0 lb

## 2019-01-24 DIAGNOSIS — R05 Cough: Secondary | ICD-10-CM

## 2019-01-24 DIAGNOSIS — R053 Chronic cough: Secondary | ICD-10-CM

## 2019-01-24 DIAGNOSIS — I129 Hypertensive chronic kidney disease with stage 1 through stage 4 chronic kidney disease, or unspecified chronic kidney disease: Secondary | ICD-10-CM | POA: Diagnosis not present

## 2019-01-24 MED ORDER — LOSARTAN POTASSIUM 25 MG PO TABS: 25.0000 mg | ORAL_TABLET | Freq: Every day | ORAL | 1 refills | Status: DC

## 2019-01-24 NOTE — Progress Notes (Signed)
BP (!) 142/88   Ht 5\' 7"  (1.702 m)   Wt 150 lb (68 kg)   BMI 23.49 kg/m    Subjective:    Patient ID: Arthur [name], male    DOB: 1965-09-15, 54 y.o.   MRN: [id number]  HPI: Arthur [name] is a 54 y.o. male  Chief Complaint  Patient presents with  . Cough  . Hypertension    . This visit was completed via WebEx due to the restrictions of the COVID-19 pandemic. All issues as above were discussed and addressed. Physical exam was done as above through visual confirmation on WebEx. If it was felt that the patient should be evaluated in the office, they were directed there. The patient verbally consented to this visit. . Location of the patient: home . Location of the provider: home . Those involved with this call:  . Provider: Merrie Roof, PA-C . CMA: Lesle Chris, [place] . Front Desk/Registration: Jill Side  . Time spent on call: 15 minutes with patient face to face via video conference. More than 50% of this time was spent in counseling and coordination of care. 5 minutes total spent in review of patient's record and preparation of their chart. I verified patient identity using two factors (patient name and date of birth). Patient consents verbally to being seen via telemedicine visit today.   Started losartan instead of lisinopril last time due to chronic cough ongoing for over a year despite good allergy and reflux regimen. BPs have been running around 120-130s/80s typically at home and states chronic cough has dissipated for the first time in about a year. Tolerating the medication well and pleased with how he's feeling now. Denies CP, SOB, HAs, dizziness, persistent cough, SOB, wheezing.   Relevant past medical, surgical, family and social history reviewed and updated as indicated. Interim medical history since our last visit reviewed. Allergies and medications reviewed and updated.  Review of Systems  Per HPI unless specifically indicated above     Objective:    BP  (!) 142/88   Ht 5\' 7"  (1.702 m)   Wt 150 lb (68 kg)   BMI 23.49 kg/m   Wt Readings from Last 3 Encounters:  01/24/19 150 lb (68 kg)  01/05/19 150 lb (68 kg)  12/20/18 150 lb (68 kg)    Physical Exam Vitals and nursing note reviewed.  Constitutional:      General: He is not in acute distress.    Appearance: Normal appearance.  HENT:     Head: Atraumatic.     Right Ear: External ear normal.     Left Ear: External ear normal.     Nose: Nose normal. No congestion.     Mouth/Throat:     Mouth: Mucous membranes are moist.     Pharynx: Oropharynx is clear.  Eyes:     Extraocular Movements: Extraocular movements intact.     Conjunctiva/sclera: Conjunctivae normal.  Pulmonary:     Effort: Pulmonary effort is normal. No respiratory distress.  Musculoskeletal:        General: Normal range of motion.     Cervical back: Normal range of motion.  Skin:    General: Skin is dry.     Findings: No erythema or rash.  Neurological:     Mental Status: He is oriented to person, place, and time.  Psychiatric:        Mood and Affect: Mood normal.        Thought Content: Thought content normal.  Judgment: Judgment normal.     Results for orders placed or performed during the hospital encounter of 01/10/19  SARS CORONAVIRUS 2 (TAT 6-24 HRS) Nasopharyngeal Nasopharyngeal Swab   Specimen: Nasopharyngeal Swab  Result Value Ref Range   SARS Coronavirus 2 POSITIVE (A) NEGATIVE      Assessment & Plan:   Problem List Items Addressed This Visit      Genitourinary   Benign hypertensive renal disease - Primary    Bp mildly elevated today but he states typically WNL when checked at home. Continue close home monitoring and call with persistently abnormal readings. Continue current regimen - may need to increase losartan later on       Other Visit Diagnoses    Chronic cough       Appears improved/resolved with switch off ACEI but also feel allergies contribute so continue good allergy  regimen       Follow up plan: Return in about 6 months (around 07/24/2019) for 6 month f/u.

## 2019-01-24 NOTE — Assessment & Plan Note (Signed)
Bp mildly elevated today but he states typically WNL when checked at home. Continue close home monitoring and call with persistently abnormal readings. Continue current regimen - may need to increase losartan later on

## 2019-01-25 ENCOUNTER — Ambulatory Visit
Admission: RE | Admit: 2019-01-25 | Discharge: 2019-01-25 | Disposition: A | Discharge status: Home / Self Care | Payer: Managed Care, Other (non HMO) | Attending: Gastroenterology | Admitting: Gastroenterology

## 2019-01-25 ENCOUNTER — Ambulatory Visit: Payer: Managed Care, Other (non HMO) | Admitting: Anesthesiology

## 2019-01-25 ENCOUNTER — Encounter
Admission: RE | Disposition: A | Discharge status: Home / Self Care | Payer: Self-pay | Source: Home / Self Care | Attending: Gastroenterology

## 2019-01-25 ENCOUNTER — Encounter: Payer: Self-pay | Admitting: Gastroenterology

## 2019-01-25 DIAGNOSIS — K648 Other hemorrhoids: Secondary | ICD-10-CM | POA: Insufficient documentation

## 2019-01-25 DIAGNOSIS — E119 Type 2 diabetes mellitus without complications: Secondary | ICD-10-CM | POA: Diagnosis not present

## 2019-01-25 DIAGNOSIS — Z885 Allergy status to narcotic agent status: Secondary | ICD-10-CM | POA: Diagnosis not present

## 2019-01-25 DIAGNOSIS — Z86010 Personal history of colonic polyps: Secondary | ICD-10-CM | POA: Insufficient documentation

## 2019-01-25 DIAGNOSIS — Z1211 Encounter for screening for malignant neoplasm of colon: Secondary | ICD-10-CM

## 2019-01-25 DIAGNOSIS — D123 Benign neoplasm of transverse colon: Secondary | ICD-10-CM | POA: Diagnosis not present

## 2019-01-25 DIAGNOSIS — M549 Dorsalgia, unspecified: Secondary | ICD-10-CM | POA: Insufficient documentation

## 2019-01-25 DIAGNOSIS — E785 Hyperlipidemia, unspecified: Secondary | ICD-10-CM | POA: Insufficient documentation

## 2019-01-25 DIAGNOSIS — K635 Polyp of colon: Secondary | ICD-10-CM | POA: Diagnosis not present

## 2019-01-25 DIAGNOSIS — K644 Residual hemorrhoidal skin tags: Secondary | ICD-10-CM | POA: Diagnosis not present

## 2019-01-25 DIAGNOSIS — Z833 Family history of diabetes mellitus: Secondary | ICD-10-CM | POA: Diagnosis not present

## 2019-01-25 DIAGNOSIS — Z79899 Other long term (current) drug therapy: Secondary | ICD-10-CM | POA: Diagnosis not present

## 2019-01-25 DIAGNOSIS — I10 Essential (primary) hypertension: Secondary | ICD-10-CM | POA: Insufficient documentation

## 2019-01-25 DIAGNOSIS — Z87891 Personal history of nicotine dependence: Secondary | ICD-10-CM | POA: Diagnosis not present

## 2019-01-25 DIAGNOSIS — Z7984 Long term (current) use of oral hypoglycemic drugs: Secondary | ICD-10-CM | POA: Diagnosis not present

## 2019-01-25 DIAGNOSIS — Z836 Family history of other diseases of the respiratory system: Secondary | ICD-10-CM | POA: Insufficient documentation

## 2019-01-25 HISTORY — PX: COLONOSCOPY WITH PROPOFOL: SHX5780

## 2019-01-25 LAB — GLUCOSE, CAPILLARY: Glucose-Capillary: 88 mg/dL (ref 70–99)

## 2019-01-25 SURGERY — COLONOSCOPY WITH PROPOFOL
Anesthesia: General

## 2019-01-25 MED ORDER — SODIUM CHLORIDE 0.9 % IV SOLN: INTRAVENOUS | Status: DC

## 2019-01-25 MED ORDER — LIDOCAINE HCL (CARDIAC) PF 100 MG/5ML IV SOSY
PREFILLED_SYRINGE | INTRAVENOUS | Status: DC | PRN
  Administered 2019-01-25: 100 mg via INTRATRACHEAL

## 2019-01-25 MED ORDER — PROPOFOL 10 MG/ML IV BOLUS
INTRAVENOUS | Status: DC | PRN
  Administered 2019-01-25 (×2): 20 mg via INTRAVENOUS
  Administered 2019-01-25: 60 mg via INTRAVENOUS

## 2019-01-25 MED ORDER — PROPOFOL 500 MG/50ML IV EMUL
INTRAVENOUS | Status: DC | PRN
  Administered 2019-01-25: 165 ug/kg/min via INTRAVENOUS

## 2019-01-25 MED ORDER — PHENYLEPHRINE HCL (PRESSORS) 10 MG/ML IV SOLN
INTRAVENOUS | Status: DC | PRN
  Administered 2019-01-25 (×2): 100 ug via INTRAVENOUS

## 2019-01-25 NOTE — Op Note (Signed)
[facility] Gastroenterology Patient Name: Arthur [name] Procedure Date: 01/25/2019 9:46 AM MRN: [id number] Account #: [id number] Date of Birth: 10/12/1965 Admit Type: Outpatient Age: 54 Room: [facility] ENDO ROOM 2 Gender: Male Note Status: Finalized Procedure:             Colonoscopy Indications:           High risk colon cancer surveillance: Personal history                         of colonic polyps, Last colonoscopy: November 2007 Providers:             Lin Landsman MD, MD Referring MD:          Valerie Roys (Referring MD) Medicines:             Monitored Anesthesia Care Complications:         No immediate complications. Estimated blood loss: None. Procedure:             Pre-Anesthesia Assessment:                        - Prior to the procedure, a History and Physical was                         performed, and patient medications and allergies were                         reviewed. The patient is competent. The risks and                         benefits of the procedure and the sedation options and                         risks were discussed with the patient. All questions                         were answered and informed consent was obtained.                         Patient identification and proposed procedure were                         verified by the physician, the nurse, the                         anesthesiologist, the anesthetist and the technician                         in the pre-procedure area in the procedure room in the                         endoscopy suite. Mental Status Examination: alert and                         oriented. Airway Examination: normal oropharyngeal                         airway and neck mobility. Respiratory Examination:  clear to auscultation. CV Examination: normal.                         Prophylactic Antibiotics: The patient does not require                         prophylactic antibiotics.  Prior Anticoagulants: The                         patient has taken no previous anticoagulant or                         antiplatelet agents. ASA Grade Assessment: III - A                         patient with severe systemic disease. After reviewing                         the risks and benefits, the patient was deemed in                         satisfactory condition to undergo the procedure. The                         anesthesia plan was to use monitored anesthesia care                         (MAC). Immediately prior to administration of                         medications, the patient was re-assessed for adequacy                         to receive sedatives. The heart rate, respiratory                         rate, oxygen saturations, blood pressure, adequacy of                         pulmonary ventilation, and response to care were                         monitored throughout the procedure. The physical                         status of the patient was re-assessed after the                         procedure.                        After obtaining informed consent, the colonoscope was                         passed under direct vision. Throughout the procedure,                         the patient's blood pressure, pulse, and oxygen  saturations were monitored continuously. The                         Colonoscope was introduced through the anus and                         advanced to the the terminal ileum, with                         identification of the appendiceal orifice and IC                         valve. The colonoscopy was performed without                         difficulty. The patient tolerated the procedure well.                         The quality of the bowel preparation was evaluated                         using the BBPS [facility] Bowel Preparation Scale) with                         scores of: Right Colon = 3, Transverse Colon = 3 and                          Left Colon = 3 (entire mucosa seen well with no                         residual staining, small fragments of stool or opaque                         liquid). The total BBPS score equals 9. Findings:      The perianal and digital rectal examinations were normal. Pertinent       negatives include normal sphincter tone and no palpable rectal lesions.      The terminal ileum appeared normal.      A diminutive polyp was found in the transverse colon. The polyp was       sessile. The polyp was removed with a jumbo cold forceps. Resection and       retrieval were complete.      Non-bleeding external and internal hemorrhoids were found during       retroflexion. The hemorrhoids were large.      The exam was otherwise without abnormality. Impression:            - The examined portion of the ileum was normal.                        - One diminutive polyp in the transverse colon,                         removed with a jumbo cold forceps. Resected and                         retrieved.                        -  Non-bleeding external and internal hemorrhoids.                        - The examination was otherwise normal. Recommendation:        - Discharge patient to home (with escort).                        - Resume previous diet today.                        - Continue present medications.                        - Await pathology results.                        - Repeat colonoscopy in 7 years for surveillance. Procedure Code(s):     --- Professional ---                        [phone], Colonoscopy, flexible; with biopsy, single or                         multiple Diagnosis Code(s):     --- Professional ---                        Z86.010, Personal history of colonic polyps                        K64.8, Other hemorrhoids                        K63.5, Polyp of colon CPT copyright 2019 American Medical Association. All rights reserved. The codes documented in this report are preliminary  and upon coder review may  be revised to meet current compliance requirements. Dr. Ulyess Mort Lin Landsman MD, MD 01/25/2019 10:19:56 AM This report has been signed electronically. Number of Addenda: 0 Note Initiated On: 01/25/2019 9:46 AM Scope Withdrawal Time: 0 hours 14 minutes 24 seconds  Total Procedure Duration: 0 hours 18 minutes 47 seconds  Estimated Blood Loss:  Estimated blood loss: none.      [facility]

## 2019-01-25 NOTE — Anesthesia Preprocedure Evaluation (Signed)
Anesthesia Evaluation  Patient identified by MRN, date of birth, ID band Patient awake    Reviewed: Allergy & Precautions, NPO status , Patient's Chart, lab work & pertinent test results  History of Anesthesia Complications Negative for: history of anesthetic complications  Airway Mallampati: II       Dental   Pulmonary neg sleep apnea, neg COPD, Not current smoker, former smoker,           Cardiovascular hypertension, Pt. on medications (-) Past MI and (-) CHF (-) dysrhythmias (-) Valvular Problems/Murmurs     Neuro/Psych neg Seizures    GI/Hepatic Neg liver ROS, neg GERD  ,  Endo/Other  diabetes, Type 2, Oral Hypoglycemic Agents, Insulin Dependent  Renal/GU Renal InsufficiencyRenal disease     Musculoskeletal   Abdominal   Peds  Hematology   Anesthesia Other Findings   Reproductive/Obstetrics                             Anesthesia Physical Anesthesia Plan  ASA: III  Anesthesia Plan: General   Post-op Pain Management:    Induction: Intravenous  PONV Risk Score and Plan: 2 and Propofol infusion and TIVA  Airway Management Planned: Nasal Cannula  Additional Equipment:   Intra-op Plan:   Post-operative Plan:   Informed Consent: I have reviewed the patients History and Physical, chart, labs and discussed the procedure including the risks, benefits and alternatives for the proposed anesthesia with the patient or authorized representative who has indicated his/her understanding and acceptance.       Plan Discussed with:   Anesthesia Plan Comments:         Anesthesia Quick Evaluation

## 2019-01-25 NOTE — H&P (Signed)
Cephas Darby, MD [address]  [place]  [place], [place] [address]  Main: [phone]  Fax: [phone] Pager: [phone]  Primary Care Physician:  Valerie Roys, DO Primary Gastroenterologist:  Dr. Cephas Darby  Pre-Procedure History & Physical: HPI:  Arthur [name] is a 54 y.o. male is here for an colonoscopy.   Past Medical History:  Diagnosis Date  . Diabetes mellitus with renal complications ([place])   . ED (erectile dysfunction)   . Erectile dysfunction associated with type 2 diabetes mellitus ([place])   . Exotropia   . Hyperlipidemia   . Hypertension   . Thoracic back pain   . Type II diabetes mellitus with renal manifestations, uncontrolled ([place]) 06/12/2014    Past Surgical History:  Procedure Laterality Date  . HIP SURGERY    . SHOULDER SURGERY Left    bone spurs removed    Prior to Admission medications   Medication Sig Start Date End Date Taking? Authorizing Provider  atorvastatin (LIPITOR) 40 MG tablet Take 2 tablets (80 mg total) by mouth daily. 12/06/18  Yes Johnson, Megan P, DO  benzonatate (TESSALON) 100 MG capsule Take 1 capsule (100 mg total) by mouth 2 (two) times daily as needed for cough. 12/23/18  Yes Johnson, Megan P, DO  chlorpheniramine-HYDROcodone (TUSSIONEX PENNKINETIC ER) 10-8 MG/5ML SUER Take 5 mLs by mouth at bedtime as needed. 01/05/19  Yes Volney American, PA-C  empagliflozin (JARDIANCE) 25 MG TABS tablet Take 25 mg by mouth daily. 12/06/18  Yes Johnson, Megan P, DO  fluticasone (FLONASE) 50 MCG/ACT nasal spray Place 2 sprays into both nostrils daily. 12/06/18  Yes Johnson, Megan P, DO  Insulin Degludec-Liraglutide (XULTOPHY) 100-3.6 UNIT-MG/ML SOPN Inject 50 Units into the skin at bedtime. 12/06/18  Yes Johnson, Megan P, DO  losartan (COZAAR) 25 MG tablet Take 1 tablet (25 mg total) by mouth daily. 01/24/19  Yes Volney American, PA-C  metFORMIN (GLUCOPHAGE) 1000 MG tablet TAKE 1 TABLET BY MOUTH TWICE DAILY 12/06/18  Yes  Johnson, Megan P, DO  methocarbamol (ROBAXIN) 500 MG tablet Take by mouth 2 (two) times daily.  01/18/19  Yes [provider]  montelukast (SINGULAIR) 10 MG tablet Take 1 tablet (10 mg total) by mouth at bedtime. 12/06/18  Yes Johnson, Megan P, DO  glucose blood test strip 1 each by Other route as needed for other. One touch ultra test strips. Use as instructed. 04/16/17   Johnson, Megan P, DO  Insulin Pen Needle (NOVOFINE PLUS) 32G X 4 MM MISC Use one needle daily with insulin 06/24/18   Johnson, Megan P, DO  sildenafil (VIAGRA) 100 MG tablet Take 0.5-1 tablets (50-100 mg total) by mouth daily as needed for erectile dysfunction. 08/01/18   Park Liter P, DO    Allergies as of 01/11/2019 - Review Complete 01/05/2019  Allergen Reaction Noted  . Codeine  11/03/2017  . Hydrocodone Nausea Only 05/28/2015  . Oxycodone Nausea Only 05/28/2015    Family History  Problem Relation Age of Onset  . Diabetes Father   . Lung disease Mother   . Diabetes Sister   . Diabetes Maternal Aunt     Social History   Socioeconomic History  . Marital status: Married    Spouse name: Not on file  . Number of children: Not on file  . Years of education: Not on file  . Highest education level: Not on file  Occupational History  . Not on file  Tobacco Use  . Smoking status: Former  Smoker    Packs/day: 1.00    Years: 20.00    Pack years: 20.00    Types: Cigarettes    Quit date: 01/05/1986    Years since quitting: 33.0  . Smokeless tobacco: Never Used  Substance and Sexual Activity  . Alcohol use: No  . Drug use: No  . Sexual activity: Not on file  Other Topics Concern  . Not on file  Social History Narrative  . Not on file   Social Determinants of Health   Financial Resource Strain:   . Difficulty of Paying Living Expenses: Not on file  Food Insecurity:   . Worried About Charity fundraiser in the Last Year: Not on file  . Ran Out of Food in the Last Year: Not on file  Transportation  Needs:   . Lack of Transportation (Medical): Not on file  . Lack of Transportation (Non-Medical): Not on file  Physical Activity:   . Days of Exercise per Week: Not on file  . Minutes of Exercise per Session: Not on file  Stress:   . Feeling of Stress : Not on file  Social Connections:   . Frequency of Communication with Friends and Family: Not on file  . Frequency of Social Gatherings with Friends and Family: Not on file  . Attends Religious Services: Not on file  . Active Member of Clubs or Organizations: Not on file  . Attends Archivist Meetings: Not on file  . Marital Status: Not on file  Intimate Partner Violence:   . Fear of Current or Ex-Partner: Not on file  . Emotionally Abused: Not on file  . Physically Abused: Not on file  . Sexually Abused: Not on file    Review of Systems: See HPI, otherwise negative ROS  Physical Exam: BP (!) 130/101   Pulse 81   Temp (!) 97.5 F (36.4 C) (Temporal)   Resp 14   Ht 5\' 7"  (1.702 m)   Wt 68 kg   SpO2 100%   BMI 23.48 kg/m  General:   Alert,  pleasant and cooperative in NAD Head:  Normocephalic and atraumatic. Neck:  Supple; no masses or thyromegaly. Lungs:  Clear throughout to auscultation.    Heart:  Regular rate and rhythm. Abdomen:  Soft, nontender and nondistended. Normal bowel sounds, without guarding, and without rebound.   Neurologic:  Alert and  oriented x4;  grossly normal neurologically.  Impression/Plan: JAECE HEREDIA is here for an colonoscopy to be performed for colon cancer screening  Risks, benefits, limitations, and alternatives regarding  colonoscopy have been reviewed with the patient.  Questions have been answered.  All parties agreeable.   Sherri Sear, MD  01/25/2019, 9:27 AM

## 2019-01-25 NOTE — Anesthesia Postprocedure Evaluation (Signed)
Anesthesia Post Note  Patient: Arthur [name]  Procedure(s) Performed: COLONOSCOPY WITH PROPOFOL (N/A )  Patient location during evaluation: Endoscopy Anesthesia Type: General Level of consciousness: awake and alert Pain management: pain level controlled Vital Signs Assessment: post-procedure vital signs reviewed and stable Respiratory status: spontaneous breathing and respiratory function stable Cardiovascular status: stable Anesthetic complications: no     Last Vitals:  Vitals:   01/25/19 0914 01/25/19 1021  BP: (!) 130/101   Pulse: 81   Resp: 14   Temp: (!) 36.4 C (!) 36.3 C  SpO2: 100%     Last Pain:  Vitals:   01/25/19 1021  TempSrc: Temporal  PainSc: Asleep                 KEPHART,WILLIAM K

## 2019-01-25 NOTE — Transfer of Care (Signed)
Immediate Anesthesia Transfer of Care Note  Patient: Arthur [name]  Procedure(s) Performed: COLONOSCOPY WITH PROPOFOL (N/A )  Patient Location: Endoscopy Unit  Anesthesia Type:General  Level of Consciousness: drowsy, patient cooperative and responds to stimulation  Airway & Oxygen Therapy: Patient Spontanous Breathing and Patient connected to face mask oxygen  Post-op Assessment: Report given to RN and Post -op Vital signs reviewed and stable  Post vital signs: Reviewed and stable  Last Vitals:  Vitals Value Taken Time  BP    Temp    Pulse 86 01/25/19 1023  Resp 20 01/25/19 1023  SpO2 100 % 01/25/19 1023  Vitals shown include unvalidated device data.  Last Pain:  Vitals:   01/25/19 1021  TempSrc: Temporal  PainSc: Asleep         Complications: No apparent anesthesia complications

## 2019-01-26 ENCOUNTER — Encounter: Payer: Self-pay | Admitting: *Deleted

## 2019-01-26 ENCOUNTER — Encounter: Payer: Self-pay | Admitting: Gastroenterology

## 2019-01-26 LAB — SURGICAL PATHOLOGY

## 2019-01-29 ENCOUNTER — Observation Stay
Admission: EM | Admit: 2019-01-29 | Discharge: 2019-01-30 | Disposition: A | Discharge status: Home / Self Care | Payer: Managed Care, Other (non HMO) | Attending: Internal Medicine | Admitting: Internal Medicine

## 2019-01-29 ENCOUNTER — Emergency Department: Payer: Managed Care, Other (non HMO)

## 2019-01-29 ENCOUNTER — Other Ambulatory Visit: Payer: Self-pay

## 2019-01-29 ENCOUNTER — Emergency Department (HOSPITAL_BASED_OUTPATIENT_CLINIC_OR_DEPARTMENT_OTHER)
Admit: 2019-01-29 | Discharge: 2019-01-29 | Disposition: A | Discharge status: Home / Self Care | Payer: Managed Care, Other (non HMO) | Attending: Internal Medicine | Admitting: Internal Medicine

## 2019-01-29 DIAGNOSIS — E119 Type 2 diabetes mellitus without complications: Secondary | ICD-10-CM

## 2019-01-29 DIAGNOSIS — E1129 Type 2 diabetes mellitus with other diabetic kidney complication: Secondary | ICD-10-CM | POA: Diagnosis not present

## 2019-01-29 DIAGNOSIS — I700 Atherosclerosis of aorta: Secondary | ICD-10-CM | POA: Insufficient documentation

## 2019-01-29 DIAGNOSIS — Z885 Allergy status to narcotic agent status: Secondary | ICD-10-CM | POA: Diagnosis not present

## 2019-01-29 DIAGNOSIS — I10 Essential (primary) hypertension: Secondary | ICD-10-CM | POA: Diagnosis not present

## 2019-01-29 DIAGNOSIS — I119 Hypertensive heart disease without heart failure: Secondary | ICD-10-CM | POA: Insufficient documentation

## 2019-01-29 DIAGNOSIS — Z7982 Long term (current) use of aspirin: Secondary | ICD-10-CM | POA: Diagnosis not present

## 2019-01-29 DIAGNOSIS — E876 Hypokalemia: Secondary | ICD-10-CM | POA: Insufficient documentation

## 2019-01-29 DIAGNOSIS — R079 Chest pain, unspecified: Secondary | ICD-10-CM

## 2019-01-29 DIAGNOSIS — R0789 Other chest pain: Secondary | ICD-10-CM | POA: Diagnosis present

## 2019-01-29 DIAGNOSIS — E1169 Type 2 diabetes mellitus with other specified complication: Secondary | ICD-10-CM | POA: Insufficient documentation

## 2019-01-29 DIAGNOSIS — U071 COVID-19: Secondary | ICD-10-CM | POA: Diagnosis present

## 2019-01-29 DIAGNOSIS — N289 Disorder of kidney and ureter, unspecified: Secondary | ICD-10-CM | POA: Insufficient documentation

## 2019-01-29 DIAGNOSIS — R002 Palpitations: Secondary | ICD-10-CM | POA: Insufficient documentation

## 2019-01-29 DIAGNOSIS — R000 Tachycardia, unspecified: Secondary | ICD-10-CM | POA: Diagnosis not present

## 2019-01-29 DIAGNOSIS — R110 Nausea: Secondary | ICD-10-CM | POA: Insufficient documentation

## 2019-01-29 DIAGNOSIS — Z8616 Personal history of COVID-19: Secondary | ICD-10-CM | POA: Diagnosis not present

## 2019-01-29 DIAGNOSIS — Z87891 Personal history of nicotine dependence: Secondary | ICD-10-CM | POA: Diagnosis not present

## 2019-01-29 DIAGNOSIS — R778 Other specified abnormalities of plasma proteins: Secondary | ICD-10-CM | POA: Diagnosis not present

## 2019-01-29 DIAGNOSIS — K56609 Unspecified intestinal obstruction, unspecified as to partial versus complete obstruction: Secondary | ICD-10-CM

## 2019-01-29 DIAGNOSIS — Z794 Long term (current) use of insulin: Secondary | ICD-10-CM | POA: Insufficient documentation

## 2019-01-29 DIAGNOSIS — Z79899 Other long term (current) drug therapy: Secondary | ICD-10-CM | POA: Diagnosis not present

## 2019-01-29 DIAGNOSIS — E785 Hyperlipidemia, unspecified: Secondary | ICD-10-CM | POA: Insufficient documentation

## 2019-01-29 HISTORY — DX: Personal history of nicotine dependence: Z87.891

## 2019-01-29 LAB — GLUCOSE, CAPILLARY
Glucose-Capillary: 88 mg/dL (ref 70–99)
Glucose-Capillary: 94 mg/dL (ref 70–99)
Glucose-Capillary: 125 mg/dL — ABNORMAL HIGH (ref 70–99)
Glucose-Capillary: 106 mg/dL — ABNORMAL HIGH (ref 70–99)
Glucose-Capillary: 139 mg/dL — ABNORMAL HIGH (ref 70–99)

## 2019-01-29 LAB — HEPATIC FUNCTION PANEL
ALT: 22 U/L (ref 0–44)
AST: 22 U/L (ref 15–41)
Albumin: 4.4 g/dL (ref 3.5–5.0)
Alkaline Phosphatase: 95 U/L (ref 38–126)
Bilirubin, Direct: <0.1 mg/dL (ref 0.0–0.2)
Total Bilirubin: 0.5 mg/dL (ref 0.3–1.2)
Total Protein: 7.8 g/dL (ref 6.5–8.1)

## 2019-01-29 LAB — TROPONIN I (HIGH SENSITIVITY)
Troponin I (High Sensitivity): 15 ng/L (ref <18)
Troponin I (High Sensitivity): 44 ng/L — ABNORMAL HIGH (ref <18)
Troponin I (High Sensitivity): 45 ng/L — ABNORMAL HIGH (ref <18)
Troponin I (High Sensitivity): 43 ng/L — ABNORMAL HIGH (ref <18)
Troponin I (High Sensitivity): 39 ng/L — ABNORMAL HIGH (ref <18)

## 2019-01-29 LAB — PROTIME-INR
INR: 0.9 (ref 0.8–1.2)
Prothrombin Time: 12.2 seconds (ref 11.4–15.2)

## 2019-01-29 LAB — HEMOGLOBIN A1C
Hgb A1c MFr Bld: 7.7 % — ABNORMAL HIGH (ref 4.8–5.6)
Mean Plasma Glucose: 174.29 mg/dL

## 2019-01-29 LAB — CBC
HCT: 43.1 % (ref 39.0–52.0)
Hemoglobin: 14.4 g/dL (ref 13.0–17.0)
MCH: 26.5 pg (ref 26.0–34.0)
MCHC: 33.4 g/dL (ref 30.0–36.0)
MCV: 79.2 fL — ABNORMAL LOW (ref 80.0–100.0)
Platelets: 327 10*3/uL (ref 150–400)
RBC: 5.44 MIL/uL (ref 4.22–5.81)
RDW: 14.6 % (ref 11.5–15.5)
WBC: 11.3 10*3/uL — ABNORMAL HIGH (ref 4.0–10.5)
nRBC: 0 % (ref 0.0–0.2)

## 2019-01-29 LAB — FIBRIN DERIVATIVES D-DIMER (ARMC ONLY): Fibrin derivatives D-dimer (ARMC): 693.37 ng/mL (FEU) — ABNORMAL HIGH (ref 0.00–499.00)

## 2019-01-29 LAB — BASIC METABOLIC PANEL
Anion gap: 13 (ref 5–15)
BUN: 13 mg/dL (ref 6–20)
CO2: 27 mmol/L (ref 22–32)
Calcium: 10.4 mg/dL — ABNORMAL HIGH (ref 8.9–10.3)
Chloride: 102 mmol/L (ref 98–111)
Creatinine, Ser: 1.19 mg/dL (ref 0.61–1.24)
GFR calc Af Amer: >60 mL/min (ref >60)
GFR calc non Af Amer: >60 mL/min (ref >60)
Glucose, Bld: 82 mg/dL (ref 70–99)
Potassium: 3.4 mmol/L — ABNORMAL LOW (ref 3.5–5.1)
Sodium: 142 mmol/L (ref 135–145)

## 2019-01-29 LAB — ECHOCARDIOGRAM COMPLETE
Height: 67 in
Weight: 2460.8 oz

## 2019-01-29 LAB — HIV ANTIBODY (ROUTINE TESTING W REFLEX): HIV Screen 4th Generation wRfx: NONREACTIVE

## 2019-01-29 LAB — LIPASE, BLOOD: Lipase: 43 U/L (ref 11–51)

## 2019-01-29 LAB — APTT: aPTT: 26 seconds (ref 24–36)

## 2019-01-29 LAB — HEPARIN LEVEL (UNFRACTIONATED)
Heparin Unfractionated: 0.38 IU/mL (ref 0.30–0.70)
Heparin Unfractionated: 0.27 IU/mL — ABNORMAL LOW (ref 0.30–0.70)

## 2019-01-29 MED ORDER — FAMOTIDINE 20 MG PO TABS
20.0000 mg | ORAL_TABLET | Freq: Once | ORAL | Status: AC
  Administered 2019-01-29: 20 mg via ORAL
  Filled 2019-01-29: qty 1

## 2019-01-29 MED ORDER — NITROGLYCERIN 0.4 MG SL SUBL: 0.4000 mg | SUBLINGUAL_TABLET | SUBLINGUAL | Status: DC | PRN

## 2019-01-29 MED ORDER — MONTELUKAST SODIUM 10 MG PO TABS
10.0000 mg | ORAL_TABLET | Freq: Every day | ORAL | Status: DC
  Administered 2019-01-29: 10 mg via ORAL
  Filled 2019-01-29 (×2): qty 1

## 2019-01-29 MED ORDER — ALUM & MAG HYDROXIDE-SIMETH 200-200-20 MG/5ML PO SUSP
15.0000 mL | Freq: Once | ORAL | Status: AC
  Administered 2019-01-29: 15 mL via ORAL
  Filled 2019-01-29: qty 30

## 2019-01-29 MED ORDER — SUCRALFATE 1 G PO TABS
1.0000 g | ORAL_TABLET | Freq: Three times a day (TID) | ORAL | Status: DC
  Administered 2019-01-29 – 2019-01-30 (×5): 1 g via ORAL
  Filled 2019-01-29 (×5): qty 1

## 2019-01-29 MED ORDER — ACETAMINOPHEN 325 MG PO TABS
650.0000 mg | ORAL_TABLET | Freq: Four times a day (QID) | ORAL | Status: DC | PRN
  Administered 2019-01-29 – 2019-01-30 (×2): 650 mg via ORAL
  Filled 2019-01-29 (×2): qty 2

## 2019-01-29 MED ORDER — POTASSIUM CHLORIDE 10 MEQ/100ML IV SOLN
10.0000 meq | Freq: Once | INTRAVENOUS | Status: AC
  Administered 2019-01-29: 10 meq via INTRAVENOUS
  Filled 2019-01-29: qty 100

## 2019-01-29 MED ORDER — ONDANSETRON HCL 4 MG/2ML IJ SOLN
4.0000 mg | Freq: Once | INTRAMUSCULAR | Status: AC
  Administered 2019-01-29: 4 mg via INTRAVENOUS
  Filled 2019-01-29: qty 2

## 2019-01-29 MED ORDER — HEPARIN (PORCINE) 25000 UT/250ML-% IV SOLN
1000.0000 [IU]/h | INTRAVENOUS | Status: DC
  Administered 2019-01-29: 800 [IU]/h via INTRAVENOUS
  Administered 2019-01-30: 1000 [IU]/h via INTRAVENOUS
  Filled 2019-01-29: qty 250

## 2019-01-29 MED ORDER — LOSARTAN POTASSIUM 25 MG PO TABS
25.0000 mg | ORAL_TABLET | Freq: Every day | ORAL | Status: DC
  Administered 2019-01-29: 25 mg via ORAL
  Filled 2019-01-29 (×2): qty 1

## 2019-01-29 MED ORDER — ASPIRIN EC 81 MG PO TBEC
81.0000 mg | DELAYED_RELEASE_TABLET | Freq: Every day | ORAL | Status: DC
  Administered 2019-01-29 – 2019-01-30 (×2): 81 mg via ORAL
  Filled 2019-01-29 (×2): qty 1

## 2019-01-29 MED ORDER — IOHEXOL 350 MG/ML SOLN
75.0000 mL | Freq: Once | INTRAVENOUS | Status: AC | PRN
  Administered 2019-01-29: 75 mL via INTRAVENOUS

## 2019-01-29 MED ORDER — ATORVASTATIN CALCIUM 80 MG PO TABS
80.0000 mg | ORAL_TABLET | Freq: Every day | ORAL | Status: DC
  Administered 2019-01-29: 80 mg via ORAL
  Filled 2019-01-29: qty 1

## 2019-01-29 MED ORDER — ATORVASTATIN CALCIUM 20 MG PO TABS: 80.0000 mg | ORAL_TABLET | Freq: Every day | ORAL | Status: DC

## 2019-01-29 MED ORDER — ASPIRIN 81 MG PO CHEW
324.0000 mg | CHEWABLE_TABLET | Freq: Once | ORAL | Status: AC
  Administered 2019-01-29: 324 mg via ORAL
  Filled 2019-01-29: qty 4

## 2019-01-29 MED ORDER — HYDRALAZINE HCL 20 MG/ML IJ SOLN: 5.0000 mg | Freq: Four times a day (QID) | INTRAMUSCULAR | Status: DC | PRN

## 2019-01-29 MED ORDER — FLUTICASONE PROPIONATE 50 MCG/ACT NA SUSP
2.0000 | Freq: Every day | NASAL | Status: DC
  Filled 2019-01-29: qty 16

## 2019-01-29 MED ORDER — BENZONATATE 100 MG PO CAPS: 100.0000 mg | ORAL_CAPSULE | Freq: Two times a day (BID) | ORAL | Status: DC | PRN

## 2019-01-29 MED ORDER — HEPARIN BOLUS VIA INFUSION
4000.0000 [IU] | Freq: Once | INTRAVENOUS | Status: AC
  Administered 2019-01-29: 4000 [IU] via INTRAVENOUS
  Filled 2019-01-29: qty 4000

## 2019-01-29 MED ORDER — INSULIN ASPART 100 UNIT/ML ~~LOC~~ SOLN: 0.0000 [IU] | Freq: Every day | SUBCUTANEOUS | Status: DC

## 2019-01-29 MED ORDER — PANTOPRAZOLE SODIUM 40 MG IV SOLR
40.0000 mg | Freq: Two times a day (BID) | INTRAVENOUS | Status: DC
  Administered 2019-01-29 – 2019-01-30 (×3): 40 mg via INTRAVENOUS
  Filled 2019-01-29 (×3): qty 40

## 2019-01-29 MED ORDER — INSULIN ASPART 100 UNIT/ML ~~LOC~~ SOLN
0.0000 [IU] | Freq: Three times a day (TID) | SUBCUTANEOUS | Status: DC
  Administered 2019-01-29: 2 [IU] via SUBCUTANEOUS
  Administered 2019-01-30: 3 [IU] via SUBCUTANEOUS
  Administered 2019-01-30: 2 [IU] via SUBCUTANEOUS
  Filled 2019-01-29 (×3): qty 1

## 2019-01-29 NOTE — Consult Note (Signed)
ANTICOAGULATION CONSULT NOTE - Initial Consult  Pharmacy Consult for Heparin Infusion  Indication: chest pain/ACS  Allergies  Allergen Reactions  . Codeine Nausea Only  . Hydrocodone Nausea Only  . Oxycodone Nausea Only    Patient Measurements: Height: 5\' 7"  (170.2 cm) Weight: 153 lb 12.8 oz (69.8 kg) IBW/kg (Calculated) : 66.1  Vital Signs: Temp: 97.5 F (36.4 C) (01/24 1553) Temp Source: Oral (01/24 1553) BP: 118/84 (01/24 1553) Pulse Rate: 95 (01/24 1553)  Labs: Recent Labs    01/29/19 0328 01/29/19 0328 01/29/19 0545 01/29/19 0545 01/29/19 0919 01/29/19 1141 01/29/19 1318 01/29/19 1627  HGB 14.4  --   --   --   --   --   --   --   HCT 43.1  --   --   --   --   --   --   --   PLT 327  --   --   --   --   --   --   --   APTT  --   --  26  --   --   --   --   --   LABPROT  --   --  12.2  --   --   --   --   --   INR  --   --  0.9  --   --   --   --   --   HEPARINUNFRC  --   --   --   --   --   --   --  0.38  CREATININE 1.19  --   --   --   --   --   --   --   TROPONINIHS 15   < > 44*   < > 45* 43* 39*  --    < > = values in this interval not displayed.    Estimated Creatinine Clearance: 65.6 mL/min (by C-G formula based on SCr of 1.19 mg/dL).   Medical History: Past Medical History:  Diagnosis Date  . Diabetes mellitus with renal complications ([place])   . ED (erectile dysfunction)   . Erectile dysfunction associated with type 2 diabetes mellitus ([place])   . Exotropia   . History of tobacco abuse   . Hyperlipidemia   . Hypertension   . Thoracic back pain   . Type II diabetes mellitus with renal manifestations, uncontrolled ([place]) 06/12/2014    Assessment: Pharmacy consulted for heparin infusion dosing and monitoring for 54 yo male for Chest Pain/NSTEMI.   Troponin I (HS): 15>> 44   Goal of Therapy:  Heparin level 0.3-0.7 units/ml Monitor platelets by anticoagulation protocol: Yes   Plan:  Baseline labs ordered Give 4000 units bolus x 1 Start  heparin infusion at 800 units/hr Check anti-Xa level in 6 hours and daily while on heparin Continue to monitor H&H and platelets  1/24: HL @ 1627 = 0.38 Will continue this pt on current rate and draw confirmation level on 1/24 @ 2230.   Orene Desanctis, PharmD Clinical Pharmacist 01/29/2019 5:33 PM

## 2019-01-29 NOTE — ED Triage Notes (Signed)
Pt arrives POV w cc of chest pain 5/10 was 9/10 earlier around 9pm. Describes as sore. Pt took some peptobismol to relieve nausea. Pt denies shob or radiation of pain.  Pt called EMS earlier, HR was 180s per pt. Pt decided to ride w wife to ED.  Pt states he checked sugar and it was 70.

## 2019-01-29 NOTE — ED Notes (Signed)
Dr. Jacqualine Code made aware of pt Arthur [name] troponin

## 2019-01-29 NOTE — ED Notes (Signed)
US at bedside

## 2019-01-29 NOTE — Consult Note (Signed)
ANTICOAGULATION CONSULT NOTE - Initial Consult  Pharmacy Consult for Heparin Infusion  Indication: chest pain/ACS  Allergies  Allergen Reactions  . Codeine Nausea Only  . Hydrocodone Nausea Only  . Oxycodone Nausea Only    Patient Measurements: Height: 5\' 7"  (170.2 cm) Weight: 153 lb 12.8 oz (69.8 kg) IBW/kg (Calculated) : 66.1  Vital Signs: Temp: 98.7 F (37.1 C) (01/24 2053) Temp Source: Oral (01/24 2053) BP: 114/81 (01/24 2053) Pulse Rate: 95 (01/24 2053)  Labs: Recent Labs    01/29/19 0328 01/29/19 0328 01/29/19 0545 01/29/19 0545 01/29/19 0919 01/29/19 1141 01/29/19 1318 01/29/19 1627 01/29/19 2230  HGB 14.4  --   --   --   --   --   --   --   --   HCT 43.1  --   --   --   --   --   --   --   --   PLT 327  --   --   --   --   --   --   --   --   APTT  --   --  26  --   --   --   --   --   --   LABPROT  --   --  12.2  --   --   --   --   --   --   INR  --   --  0.9  --   --   --   --   --   --   HEPARINUNFRC  --   --   --   --   --   --   --  0.38 0.27*  CREATININE 1.19  --   --   --   --   --   --   --   --   TROPONINIHS 15   < > 44*   < > 45* 43* 39*  --   --    < > = values in this interval not displayed.    Estimated Creatinine Clearance: 65.6 mL/min (by C-G formula based on SCr of 1.19 mg/dL).   Medical History: Past Medical History:  Diagnosis Date  . Diabetes mellitus with renal complications ([place])   . ED (erectile dysfunction)   . Erectile dysfunction associated with type 2 diabetes mellitus ([place])   . Exotropia   . History of tobacco abuse   . Hyperlipidemia   . Hypertension   . Thoracic back pain   . Type II diabetes mellitus with renal manifestations, uncontrolled ([place]) 06/12/2014    Assessment: Pharmacy consulted for heparin infusion dosing and monitoring for 54 yo male for Chest Pain/NSTEMI.   Troponin I (HS): 15>> 44   Goal of Therapy:  Heparin level 0.3-0.7 units/ml Monitor platelets by anticoagulation protocol: Yes   Plan:   Baseline labs ordered Give 4000 units bolus x 1 Start heparin infusion at 800 units/hr Check anti-Xa level in 6 hours and daily while on heparin Continue to monitor H&H and platelets  1/24: HL @ 1627 = 0.38 Will continue this pt on current rate and draw confirmation level on 1/24 @ 2230.  1/24 @ 2230 HL = 0.27, subtherapeutic.  Will increase Heparin to 1000 units/hr and recheck HL in 6 hrs  Ena Dawley, PharmD Clinical Pharmacist 01/29/2019 11:32 PM

## 2019-01-29 NOTE — H&P (Addendum)
History and Physical    Arthur [name] [id number] DOB: [birth year] DOA: 01/29/2019   PCP: Valerie Roys, DO   Outpatient Specialists: Dr. Marius Ditch GI.  Patient coming from: Home  Chief Complaint: Chest pain  HPI: Arthur [name] is a 54 y.o. male with medical history significant of diabetes type 2, hypertension, dyslipidemia presenting with chest discomfort that started last night. Pt states he forgot to take his metformin and home meds. Pt took his meds after sinner at [employer]. Then he got home he atr a snack at 11pm. Pt decided to take =his insulin and went down stairs and felt funny like his sugar was low he was having palpitation so he went to check his sugar which was 70 . Then his heart started to racing and nausea and then he called ems, by the time of EMS arrival he was started to feeling better, and ate PBJ sandwich and pt was having chest tightness come to hospital by private car and wife dropped him off.Pt has TIMI score of 3 @ 13 % risk of MI. Right now no chest pain / and since last nights episode of chest pressure , it is resolved and he feels normal.  Currently pt is chest pain free and feels like he is back to normal. Pt does reports using goody powders for headaches and his rt shoulder arthritis s/p surgery and says he feels better with GI cocktail given in ed with Mylanta and Maalox, so UGI eval is consideration on outpatient basis .   ED Course:  Blood pressure (!) 138/100, pulse 99, temperature 98.2 F (36.8 C), temperature source Oral, resp. rate 20, height 5\' 7"  (1.702 m), weight 68 kg, SpO2 96 %. Vitals showed blood pressure elevation, patient is afebrile alert awake gives his history.  Metabolic panel shows mild hypokalemia of 3.4, normal creatinine of 1.19 with GFR of more than 60, elevated calcium of 10.4, initial troponin of 15, second troponin of 44, lipid panel in December shows low HDL of 36, LDL of 59,Elevated triglycerides of 204 we will repeat lipid  panel today, liver function sugars albumin of 4.4.   Chest x-ray done today shows no acute cardiopulmonary disease and negative abdominal radiographs.  Patient given 324 aspirin, 20, Zofran 4 mg, Maalox Mylanta combination 15 able x1.  Patient in CT scan currently having chest CTA PE protocol as he has recently had COVID-19 infection.  Review of Systems: As per HPI otherwise 10 point review of systems negative.  Currently ROS is negative for all systems.   Past Medical History:  Diagnosis Date  . Diabetes mellitus with renal complications ([place])   . ED (erectile dysfunction)   . Erectile dysfunction associated with type 2 diabetes mellitus ([place])   . Exotropia   . History of tobacco abuse   . Hyperlipidemia   . Hypertension   . Thoracic back pain   . Type II diabetes mellitus with renal manifestations, uncontrolled ([place]) 06/12/2014    Past Surgical History:  Procedure Laterality Date  . COLONOSCOPY WITH PROPOFOL N/A 01/25/2019   Procedure: COLONOSCOPY WITH PROPOFOL;  Surgeon: Lin Landsman, MD;  Location: [facility] ENDOSCOPY;  Service: Gastroenterology;  Laterality: N/A;  Positive on 12/22/2018  . HIP SURGERY    . SHOULDER SURGERY Left    bone spurs removed     reports that he quit smoking about 33 years ago. His smoking use included cigarettes. He has a 20.00 pack-year smoking history. He has never used smokeless tobacco. He  reports that he does not drink alcohol or use drugs.  Allergies  Allergen Reactions  . Codeine Nausea Only  . Hydrocodone Nausea Only  . Oxycodone Nausea Only    Family History  Problem Relation Age of Onset  . Diabetes Father   . Lung disease Mother   . Diabetes Sister   . Diabetes Maternal Aunt     Prior to Admission medications --- GOODY'S POWDER.   Medication Sig Start Date End Date Taking? Authorizing Provider  atorvastatin (LIPITOR) 40 MG tablet Take 2 tablets (80 mg total) by mouth daily. 12/06/18  Yes Johnson, Megan P, DO    chlorpheniramine-HYDROcodone (TUSSIONEX PENNKINETIC ER) 10-8 MG/5ML SUER Take 5 mLs by mouth at bedtime as needed. 01/05/19  Yes Volney American, PA-C  empagliflozin (JARDIANCE) 25 MG TABS tablet Take 25 mg by mouth daily. 12/06/18  Yes Johnson, Megan P, DO  fluticasone (FLONASE) 50 MCG/ACT nasal spray Place 2 sprays into both nostrils daily. 12/06/18  Yes Johnson, Megan P, DO  glucose blood test strip 1 each by Other route as needed for other. One touch ultra test strips. Use as instructed. 04/16/17  Yes Johnson, Megan P, DO  Insulin Degludec-Liraglutide (XULTOPHY) 100-3.6 UNIT-MG/ML SOPN Inject 50 Units into the skin at bedtime. 12/06/18  Yes Johnson, Megan P, DO  Insulin Pen Needle (NOVOFINE PLUS) 32G X 4 MM MISC Use one needle daily with insulin 06/24/18  Yes Johnson, Megan P, DO  losartan (COZAAR) 25 MG tablet Take 1 tablet (25 mg total) by mouth daily. 01/24/19  Yes Volney American, PA-C  metFORMIN (GLUCOPHAGE) 1000 MG tablet TAKE 1 TABLET BY MOUTH TWICE DAILY 12/06/18  Yes Johnson, Megan P, DO  methocarbamol (ROBAXIN) 500 MG tablet Take by mouth 2 (two) times daily.  01/18/19  Yes [provider]  sildenafil (VIAGRA) 100 MG tablet  Not used for past few months per patient. Take 0.5-1 tablets (50-100 mg total) by mouth daily as needed for erectile dysfunction. 08/01/18  Yes Johnson, Megan P, DO  benzonatate (TESSALON) 100 MG capsule Take 1 capsule (100 mg total) by mouth 2 (two) times daily as needed for cough. Patient not taking: Reported on 01/29/2019 12/23/18   Park Liter P, DO  montelukast (SINGULAIR) 10 MG tablet Take 1 tablet (10 mg total) by mouth at bedtime. Patient not taking: Reported on 01/29/2019 12/06/18   Valerie Roys, DO    Physical Exam: Vitals:   01/29/19 0745 01/29/19 0800 01/29/19 0846 01/29/19 0900  BP:  (!) 145/99  140/83  Pulse: (!) 110 (!) 106  (!) 101  Resp:    18  Temp:      TempSrc:      SpO2: 98% 98%  92%  Weight:   68 kg   Height:           Constitutional: NAD, calm, comfortable Vitals:   01/29/19 0745 01/29/19 0800 01/29/19 0846 01/29/19 0900  BP:  (!) 145/99  140/83  Pulse: (!) 110 (!) 106  (!) 101  Resp:    18  Temp:      TempSrc:      SpO2: 98% 98%  92%  Weight:   68 kg   Height:       Eyes: PERRL, lids and conjunctivae normal ENMT: Mucous membranes are moist. Posterior pharynx clear of any exudate or lesions.Normal dentition.  Neck: normal, supple, no masses, no thyromegaly Respiratory: clear to auscultation bilaterally, no wheezing, no crackles. Normal respiratory effort. No accessory muscle use.  Cardiovascular: Regular rate and rhythm, no murmurs / rubs / gallops. No extremity edema. 2+ pedal pulses. No carotid bruits.  Abdomen: no tenderness, no masses palpated. No hepatosplenomegaly. Bowel sounds positive.  Musculoskeletal: no clubbing / cyanosis. No joint deformity upper and lower extremities. Good ROM, no contractures. Normal muscle tone.  Skin: no rashes, lesions, ulcers. No induration Neurologic: CN 2-12 grossly intact. Sensation intact, DTR normal. Strength 5/5 in all 4.  Psychiatric: Normal judgment and insight. Alert and oriented x 3. Normal mood.   Labs on Admission: I have personally reviewed following labs and imaging studies.  CBC: Recent Labs  Lab 01/29/19 0328  WBC 11.3*  HGB 14.4  HCT 43.1  MCV 79.2*  PLT [id number]   Basic Metabolic Panel: Recent Labs  Lab 01/29/19 0328  NA 142  K 3.4*  CL 102  CO2 27  GLUCOSE 82  BUN 13  CREATININE 1.19  CALCIUM 10.4*   GFR: Estimated Creatinine Clearance: 65.6 mL/min (by C-G formula based on SCr of 1.19 mg/dL). Liver Function Tests: Recent Labs  Lab 01/29/19 0338  AST 22  ALT 22  ALKPHOS 95  BILITOT 0.5  PROT 7.8  ALBUMIN 4.4   Recent Labs  Lab 01/29/19 0338  LIPASE 43   No results for input(s): AMMONIA in the last 168 hours. Coagulation Profile: No results for input(s): INR, PROTIME in the last 168 hours. Cardiac  Enzymes: No results for input(s): CKTOTAL, CKMB, CKMBINDEX, TROPONINI in the last 168 hours. BNP (last 3 results) No results for input(s): PROBNP in the last 8760 hours. HbA1C: No results for input(s): HGBA1C in the last 72 hours. CBG: Recent Labs  Lab 01/25/19 0923 01/29/19 0333 01/29/19 0437  GLUCAP 88 88 94   Lipid Profile: No results for input(s): CHOL, HDL, LDLCALC, TRIG, CHOLHDL, LDLDIRECT in the last 72 hours. Thyroid Function Tests: No results for input(s): TSH, T4TOTAL, FREET4, T3FREE, THYROIDAB in the last 72 hours. Anemia Panel: No results for input(s): VITAMINB12, FOLATE, FERRITIN, TIBC, IRON, RETICCTPCT in the last 72 hours. Urine analysis:    Component Value Date/Time   APPEARANCEUR Clear 06/24/2018 0906   GLUCOSEU Negative 06/24/2018 0906   BILIRUBINUR Negative 06/24/2018 0906   PROTEINUR Negative 06/24/2018 0906   NITRITE Negative 06/24/2018 0906   LEUKOCYTESUR Negative 06/24/2018 0906   Radiological Exams on Admission: CT Angio Chest PE W and/or Wo Contrast  Result Date: 01/29/2019 CLINICAL DATA:  Chest pain, shortness of breath, tachycardia. COVID positive in December. EXAM: CT ANGIOGRAPHY CHEST WITH CONTRAST TECHNIQUE: Multidetector CT imaging of the chest was performed using the standard protocol during bolus administration of intravenous contrast. Multiplanar CT image reconstructions and MIPs were obtained to evaluate the vascular anatomy. CONTRAST:  4mL OMNIPAQUE IOHEXOL 350 MG/ML SOLN COMPARISON:  Chest radiographs dated 02/14/2014 FINDINGS: Cardiovascular: Satisfactory opacification of the bilateral pulmonary arteries to the segmental level. Mild motion degradation in the bilateral lower lobes. No evidence of pulmonary embolism. No evidence of thoracic aortic aneurysm or dissection. Mild atherosclerotic calcifications of the aortic arch. The heart is normal in size.  No pericardial effusion. Mediastinum/Nodes: No suspicious mediastinal lymphadenopathy.  Visualized thyroid is unremarkable. Lungs/Pleura: Lungs are essentially clear. Minimal dependent atelectasis in the bilateral lower lobes. No focal consolidation. Nodular scarring versus atelectasis at the right lung base (series 6/image 66). No suspicious pulmonary nodules. No centrilobular or paraseptal emphysematous changes. No pleural effusion or pneumothorax. Upper Abdomen: Visualized upper abdomen is unremarkable. Musculoskeletal: Degenerative changes of the visualized thoracolumbar spine. Review of the MIP images  confirms the above findings. IMPRESSION: No evidence of pulmonary embolism. No evidence of acute cardiopulmonary disease. Aortic Atherosclerosis (ICD10-I70.0). Electronically Signed   By: Julian Hy M.D.   On: 01/29/2019 07:26   DG ABD ACUTE 2+V W 1V CHEST  Result Date: 01/29/2019 CLINICAL DATA:  Chest pain EXAM: DG ABDOMEN ACUTE W/ 1V CHEST COMPARISON:  None. FINDINGS: There is no evidence of dilated bowel loops or free intraperitoneal air. No radiopaque calculi or other significant radiographic abnormality is seen. Heart size and mediastinal contours are within normal limits. Both lungs are clear. IMPRESSION: Negative abdominal radiographs.  No acute cardiopulmonary disease. Electronically Signed   By: Ulyses Jarred M.D.   On: 01/29/2019 04:11    EKG:  Sinus tachycardia at 110 / LAE.  Assessment/Plan Principal Problem:   Chest pain in adult Active Problems:   Hypertension   Hypokalemia   Type II diabetes mellitus with renal manifestations, uncontrolled ([facility])   Dyslipidemia   Chest pain  Chest pain/ UA/ NSTEMI : -Cycle cardiac enzymes shows elevated troponin. -we will continue pt on ASA/STATIN/ ARB therapy/ Prn nitro/ prn morphine.  -2 D echo-P - pt is moderate to high risk for CAD as he has DM/ Dyslipidemia/ h/o tobacco abuse . - CTA negative for PE and otherwise -due to his risk factor and clinical presentation I have requested cardiology consult and repeated  troponin , And started pt on heparin/ asa/statin/NTG prn. -pt reports gi symptoms and use of Goodys powder and UGI eval as outpatient is prudent in his case, pt may schedule As he has his own GI- Dr, Marius Ditch.  - d/w pt about risk factor modification.  Hypertension: -Continue cozaar 25 mg. -carb consistent/ cardiac diet.   Hypokalemia: -kcl 40 meq x 1 iv.  Diabetes mellitus type 2: -a1c/ SSI. -Hold metformin / Vania Rea / Xultophy as he is NPO after midnight.  Dyslipidemia: -lipid panel. -lipitor 80 mg x 1 now .  ADDENDUM: Pt has covid in his December visit and since then has recovered and has not pulmonary of gi or any symptoms concerning for covid-19, pt was tested for covid-19 today and as expected is positive. He no longer needs isolation and ct imaging negative for any pulmonary infiltrates.   DVT prophylaxis: Full anticoagulation with heparin FOR NSTEMI.  Code Status: Full  Family Communication: Wife Arthur [name]  [phone] Disposition Plan: Home / 48 hours. Consults called: Cardiology Dr Percival Spanish. Admission status: Obsevation  Para Skeans MD [facility] If 7PM-7AM, please contact night-coverage [email or web address] Password [facility] 01/29/2019, 9:25 AM

## 2019-01-29 NOTE — Consult Note (Signed)
Cardiology Consultation:   Patient ID: Arthur [name] MRN: [id number]; DOB: Nov 10, 1965  Admit date: 01/29/2019 Date of Consult: 01/29/2019  Primary Care Provider: Valerie Roys, DO Primary Cardiologist: No primary care provider on file. (New) Primary Electrophysiologist:  None    Patient Profile:   Arthur [name] is a 54 y.o. male with a hx of diabetes who is being seen today for the evaluation of elevated trop and palpitations at the request of Dr. Florina Ou.  History of Present Illness:   Arthur [name] has no past cardiac history other than a stress test years ago because of chest pain.  He presents with predominantly palpitations .  He has well-controlled blood sugar.  However, prior to presentation he felt like his blood pressure might be dropping.  He knew that he was in the 44s.  This is happened before but this time he did not feel well at all and actually felt his heart racing.  He thinks his heart rate was around 160 when his wife checked it.  Blood pressure was 180.  He did get something to eat and by the time EMS arrived he was feeling better.  However, he did ask for transport since he felt so poorly.  He did not report chest discomfort neck or arm discomfort.  He was a little nauseated.  He did not have any shortness of breath.  He has not had any presyncope or syncope.  In the ED he was hypertensive.  He was treated with Maalox, Mylanta and ASA.   CT was negative for PE.  EKG was sinus tach with no acute ST T wave changes.   Trop was initially 15 but rose to 44.     Otherwise he has felt well.  He is active.  He is doing yard work.  He walks routinely because he had a hip problem.  He denies any cardiovascular symptoms typically.  He is not having any chest pressure, neck or arm discomfort.  He does not usually have any shortness of breath, PND or orthopnea.  He has had no weight gain or edema.  Heart Pathway Score:  HEAR Score: 5  Past Medical History:  Diagnosis Date    . Diabetes mellitus with renal complications ([place])   . ED (erectile dysfunction)   . Erectile dysfunction associated with type 2 diabetes mellitus ([place])   . Exotropia   . History of tobacco abuse   . Hyperlipidemia   . Hypertension   . Thoracic back pain   . Type II diabetes mellitus with renal manifestations, uncontrolled ([place]) 06/12/2014    Past Surgical History:  Procedure Laterality Date  . COLONOSCOPY WITH PROPOFOL N/A 01/25/2019   Procedure: COLONOSCOPY WITH PROPOFOL;  Surgeon: Lin Landsman, MD;  Location: [facility] ENDOSCOPY;  Service: Gastroenterology;  Laterality: N/A;  Positive on 12/22/2018  . HIP SURGERY    . SHOULDER SURGERY Left    bone spurs removed     Home Medications:  Prior to Admission medications   Medication Sig Start Date End Date Taking? Authorizing Provider  atorvastatin (LIPITOR) 40 MG tablet Take 2 tablets (80 mg total) by mouth daily. 12/06/18  Yes Johnson, Megan P, DO  chlorpheniramine-HYDROcodone (TUSSIONEX PENNKINETIC ER) 10-8 MG/5ML SUER Take 5 mLs by mouth at bedtime as needed. 01/05/19  Yes Volney American, PA-C  empagliflozin (JARDIANCE) 25 MG TABS tablet Take 25 mg by mouth daily. 12/06/18  Yes Johnson, Megan P, DO  fluticasone (FLONASE) 50 MCG/ACT nasal spray Place 2  sprays into both nostrils daily. 12/06/18  Yes Johnson, Megan P, DO  glucose blood test strip 1 each by Other route as needed for other. One touch ultra test strips. Use as instructed. 04/16/17  Yes Johnson, Megan P, DO  Insulin Degludec-Liraglutide (XULTOPHY) 100-3.6 UNIT-MG/ML SOPN Inject 50 Units into the skin at bedtime. 12/06/18  Yes Johnson, Megan P, DO  Insulin Pen Needle (NOVOFINE PLUS) 32G X 4 MM MISC Use one needle daily with insulin 06/24/18  Yes Johnson, Megan P, DO  losartan (COZAAR) 25 MG tablet Take 1 tablet (25 mg total) by mouth daily. 01/24/19  Yes Volney American, PA-C  metFORMIN (GLUCOPHAGE) 1000 MG tablet TAKE 1 TABLET BY MOUTH TWICE DAILY 12/06/18  Yes  Johnson, Megan P, DO  methocarbamol (ROBAXIN) 500 MG tablet Take by mouth 2 (two) times daily.  01/18/19  Yes [provider]  sildenafil (VIAGRA) 100 MG tablet Take 0.5-1 tablets (50-100 mg total) by mouth daily as needed for erectile dysfunction. 08/01/18  Yes Johnson, Megan P, DO  benzonatate (TESSALON) 100 MG capsule Take 1 capsule (100 mg total) by mouth 2 (two) times daily as needed for cough. Patient not taking: Reported on 01/29/2019 12/23/18   Park Liter P, DO  montelukast (SINGULAIR) 10 MG tablet Take 1 tablet (10 mg total) by mouth at bedtime. Patient not taking: Reported on 01/29/2019 12/06/18   Valerie Roys, DO    Inpatient Medications: Scheduled Meds: . aspirin EC  81 mg Oral Daily  . atorvastatin  80 mg Oral q1800  . fluticasone  2 spray Each Nare Daily  . losartan  25 mg Oral Daily  . montelukast  10 mg Oral QHS   Continuous Infusions:  PRN Meds: benzonatate  Allergies:    Allergies  Allergen Reactions  . Codeine Nausea Only  . Hydrocodone Nausea Only  . Oxycodone Nausea Only    Social History:   Social History   Socioeconomic History  . Marital status: Married    Spouse name: Not on file  . Number of children: Not on file  . Years of education: Not on file  . Highest education level: Not on file  Occupational History  . Not on file  Tobacco Use  . Smoking status: Former Smoker    Packs/day: 1.00    Years: 20.00    Pack years: 20.00    Types: Cigarettes    Quit date: 01/05/1986    Years since quitting: 33.0  . Smokeless tobacco: Never Used  Substance and Sexual Activity  . Alcohol use: No  . Drug use: No  . Sexual activity: Yes  Other Topics Concern  . Not on file  Social History Narrative  . Not on file   Social Determinants of Health   Financial Resource Strain:   . Difficulty of Paying Living Expenses: Not on file  Food Insecurity:   . Worried About Charity fundraiser in the Last Year: Not on file  . Ran Out of Food in the  Last Year: Not on file  Transportation Needs:   . Lack of Transportation (Medical): Not on file  . Lack of Transportation (Non-Medical): Not on file  Physical Activity:   . Days of Exercise per Week: Not on file  . Minutes of Exercise per Session: Not on file  Stress:   . Feeling of Stress : Not on file  Social Connections:   . Frequency of Communication with Friends and Family: Not on file  . Frequency of Social  Gatherings with Friends and Family: Not on file  . Attends Religious Services: Not on file  . Active Member of Clubs or Organizations: Not on file  . Attends Archivist Meetings: Not on file  . Marital Status: Not on file  Intimate Partner Violence:   . Fear of Current or Ex-Partner: Not on file  . Emotionally Abused: Not on file  . Physically Abused: Not on file  . Sexually Abused: Not on file    Family History:    Family History  Problem Relation Age of Onset  . Diabetes Father   . Lung disease Mother   . Diabetes Sister   . Diabetes Maternal Aunt      ROS:  Please see the history of present illness.   All other ROS reviewed and negative.     Physical Exam/Data:   Vitals:   01/29/19 0630 01/29/19 0730 01/29/19 0745 01/29/19 0800  BP: (!) 138/100 (!) 158/100  (!) 145/99  Pulse: 99 (!) 105 (!) 110 (!) 106  Resp: 20     Temp:      TempSrc:      SpO2: 96% 95% 98% 98%  Weight:      Height:       No intake or output data in the 24 hours ending 01/29/19 0844 Last 3 Weights 01/29/2019 01/25/2019 01/24/2019  Weight (lbs) 150 lb 149 lb 14.6 oz 150 lb  Weight (kg) 68.04 kg 68 kg 68.04 kg     Body mass index is 23.49 kg/m.  GENERAL:  Well appearing HEENT:  Pupils equal round and reactive, fundi not visualized, oral mucosa unremarkable NECK:  No jugular venous distention, waveform within normal limits, carotid upstroke brisk and symmetric, no bruits, no thyromegaly LYMPHATICS:  No cervical, inguinal adenopathy LUNGS:  Clear to auscultation  bilaterally BACK:  No CVA tenderness CHEST:  Unremarkable HEART:  PMI not displaced or sustained,S1 and S2 within normal limits, no S3, no S4, no clicks, no rubs, no murmurs ABD:  Flat, positive bowel sounds normal in frequency in pitch, no bruits, no rebound, no guarding, no midline pulsatile mass, no hepatomegaly, no splenomegaly EXT:  2 plus pulses throughout, no edema, no cyanosis no clubbing SKIN:  No rashes no nodules NEURO:  Cranial nerves II through XII grossly intact, motor grossly intact throughout PSYCH:  Cognitively intact, oriented to person place and time   EKG:  The EKG was personally reviewed and demonstrates:  NSR Telemetry:  Telemetry was personally reviewed and demonstrates:    Relevant CV Studies:  ECHO:  Results pending  Laboratory Data:  High Sensitivity Troponin:   Recent Labs  Lab 01/29/19 0328 01/29/19 0545  TROPONINIHS 15 44*     Chemistry Recent Labs  Lab 01/29/19 0328  NA 142  K 3.4*  CL 102  CO2 27  GLUCOSE 82  BUN 13  CREATININE 1.19  CALCIUM 10.4*  GFRNONAA >60  GFRAA >60  ANIONGAP 13    Recent Labs  Lab 01/29/19 0338  PROT 7.8  ALBUMIN 4.4  AST 22  ALT 22  ALKPHOS 95  BILITOT 0.5   Hematology Recent Labs  Lab 01/29/19 0328  WBC 11.3*  RBC 5.44  HGB 14.4  HCT 43.1  MCV 79.2*  MCH 26.5  MCHC 33.4  RDW 14.6  PLT 327   BNPNo results for input(s): BNP, PROBNP in the last 168 hours.  DDimer No results for input(s): DDIMER in the last 168 hours.   Radiology/Studies:  CT Angio Chest  PE W and/or Wo Contrast  Result Date: 01/29/2019 CLINICAL DATA:  Chest pain, shortness of breath, tachycardia. COVID positive in December. EXAM: CT ANGIOGRAPHY CHEST WITH CONTRAST TECHNIQUE: Multidetector CT imaging of the chest was performed using the standard protocol during bolus administration of intravenous contrast. Multiplanar CT image reconstructions and MIPs were obtained to evaluate the vascular anatomy. CONTRAST:  83mL OMNIPAQUE  IOHEXOL 350 MG/ML SOLN COMPARISON:  Chest radiographs dated 02/14/2014 FINDINGS: Cardiovascular: Satisfactory opacification of the bilateral pulmonary arteries to the segmental level. Mild motion degradation in the bilateral lower lobes. No evidence of pulmonary embolism. No evidence of thoracic aortic aneurysm or dissection. Mild atherosclerotic calcifications of the aortic arch. The heart is normal in size.  No pericardial effusion. Mediastinum/Nodes: No suspicious mediastinal lymphadenopathy. Visualized thyroid is unremarkable. Lungs/Pleura: Lungs are essentially clear. Minimal dependent atelectasis in the bilateral lower lobes. No focal consolidation. Nodular scarring versus atelectasis at the right lung base (series 6/image 66). No suspicious pulmonary nodules. No centrilobular or paraseptal emphysematous changes. No pleural effusion or pneumothorax. Upper Abdomen: Visualized upper abdomen is unremarkable. Musculoskeletal: Degenerative changes of the visualized thoracolumbar spine. Review of the MIP images confirms the above findings. IMPRESSION: No evidence of pulmonary embolism. No evidence of acute cardiopulmonary disease. Aortic Atherosclerosis (ICD10-I70.0). Electronically Signed   By: Julian Hy M.D.   On: 01/29/2019 07:26   DG ABD ACUTE 2+V W 1V CHEST  Result Date: 01/29/2019 CLINICAL DATA:  Chest pain EXAM: DG ABDOMEN ACUTE W/ 1V CHEST COMPARISON:  None. FINDINGS: There is no evidence of dilated bowel loops or free intraperitoneal air. No radiopaque calculi or other significant radiographic abnormality is seen. Heart size and mediastinal contours are within normal limits. Both lungs are clear. IMPRESSION: Negative abdominal radiographs.  No acute cardiopulmonary disease. Electronically Signed   By: Ulyses Jarred M.D.   On: 01/29/2019 04:11       HEAR Score (for undifferentiated chest pain):  HEAR Score: 5    Assessment and Plan:   Chest pain: The patient really does not describe chest  pain to me describes more palpitations.  He did have a very slight nondiagnostic rise in his high-sensitivity troponin.  He does have significant cardiovascular risk factors.  I suspect that he might have had tacky arrhythmia that led to this.  I think would be reasonable to follow the enzymes and if he has no further chest discomfort overnight discharge him with plans for screening POET (Plain Old Exercise Treadmill).  DM: Patient is on an excellent diabetes protocol.  He is hemoglobin A1c has been coming down and he has been working on this with diet and exercise.  No change in therapy.  DYSLIPIDEMIA: He is on moderate dose statin.  No change in therapy.  PALPITATIONS: I would not suggest further monitoring unless he has recurrent symptoms at which point he would need a home event monitor.      For questions or updates, please contact [place] Please consult [email or web address] for contact info under     Signed, Minus Breeding, MD  01/29/2019 8:44 AM

## 2019-01-29 NOTE — ED Provider Notes (Signed)
Discussed admission with Dr. Carmon Sails.    Delman Kitten, MD 01/29/19 [phone]

## 2019-01-29 NOTE — ED Notes (Signed)
Pt transported to CT ?

## 2019-01-29 NOTE — ED Provider Notes (Signed)
[facility] Emergency Department Provider Note   ____________________________________________   First MD Initiated Contact with Patient 01/29/19 [id number]     (approximate)  I have reviewed the triage vital signs and the nursing notes.   HISTORY  Chief Complaint Chest Pain    HPI Arthur [name] is a 54 y.o. male for evaluation of chest pain and nausea  Patient reports about an hour ago he began suddenly while resting in his home beginning to feel nauseated as though a giant bubble was coming up.  Reports he had a recent colonoscopy on Wednesday.  Then he began experiencing chest pain and felt like his heart was racing to about 170.  Associated with this was chest pressure and chest pain, this improved after he took some Pepto-Bismol.  He called EMS originally, but by the time he got there he started to feel better and presents now after being driven here  He reports ongoing 5 out of 10 nature discomfort with nausea in his chest.  It is located over the mid to left chest.  It is nonradiating.  No associated fevers or chills.  Denies Covid exposure except for he already had coronavirus about a month ago which is resolved  He is an insulin-dependent diabetic, he checked his blood sugar at home and it was normal when this occurred.  Past Medical History:  Diagnosis Date  . Diabetes mellitus with renal complications ([place])   . ED (erectile dysfunction)   . Erectile dysfunction associated with type 2 diabetes mellitus ([place])   . Exotropia   . Hyperlipidemia   . Hypertension   . Thoracic back pain   . Type II diabetes mellitus with renal manifestations, uncontrolled ([place]) 06/12/2014    Patient Active Problem List   Diagnosis Date Noted  . Screening for colon cancer   . Chronic pain of left knee 12/07/2014  . Benign prostatic hyperplasia 09/05/2014  . Benign hypertensive renal disease   . Type II diabetes mellitus with renal manifestations, uncontrolled ([place])  06/12/2014  . Hyperlipidemia 06/12/2014  . Vasculogenic erectile dysfunction 06/12/2014  . Exotropia, monocular 06/12/2014  . Erectile dysfunction associated with type 2 diabetes mellitus [facility])     Past Surgical History:  Procedure Laterality Date  . COLONOSCOPY WITH PROPOFOL N/A 01/25/2019   Procedure: COLONOSCOPY WITH PROPOFOL;  Surgeon: Lin Landsman, MD;  Location: [facility] ENDOSCOPY;  Service: Gastroenterology;  Laterality: N/A;  Positive on 12/22/2018  . HIP SURGERY    . SHOULDER SURGERY Left    bone spurs removed    Prior to Admission medications   Medication Sig Start Date End Date Taking? Authorizing Provider  atorvastatin (LIPITOR) 40 MG tablet Take 2 tablets (80 mg total) by mouth daily. 12/06/18  Yes Johnson, Megan P, DO  chlorpheniramine-HYDROcodone (TUSSIONEX PENNKINETIC ER) 10-8 MG/5ML SUER Take 5 mLs by mouth at bedtime as needed. 01/05/19  Yes Volney American, PA-C  empagliflozin (JARDIANCE) 25 MG TABS tablet Take 25 mg by mouth daily. 12/06/18  Yes Johnson, Megan P, DO  fluticasone (FLONASE) 50 MCG/ACT nasal spray Place 2 sprays into both nostrils daily. 12/06/18  Yes Johnson, Megan P, DO  glucose blood test strip 1 each by Other route as needed for other. One touch ultra test strips. Use as instructed. 04/16/17  Yes Johnson, Megan P, DO  Insulin Degludec-Liraglutide (XULTOPHY) 100-3.6 UNIT-MG/ML SOPN Inject 50 Units into the skin at bedtime. 12/06/18  Yes Johnson, Megan P, DO  Insulin Pen Needle (NOVOFINE PLUS) 32G X  4 MM MISC Use one needle daily with insulin 06/24/18  Yes Johnson, Megan P, DO  losartan (COZAAR) 25 MG tablet Take 1 tablet (25 mg total) by mouth daily. 01/24/19  Yes Volney American, PA-C  metFORMIN (GLUCOPHAGE) 1000 MG tablet TAKE 1 TABLET BY MOUTH TWICE DAILY 12/06/18  Yes Johnson, Megan P, DO  methocarbamol (ROBAXIN) 500 MG tablet Take by mouth 2 (two) times daily.  01/18/19  Yes [provider]  sildenafil (VIAGRA) 100 MG tablet Take  0.5-1 tablets (50-100 mg total) by mouth daily as needed for erectile dysfunction. 08/01/18  Yes Johnson, Megan P, DO  benzonatate (TESSALON) 100 MG capsule Take 1 capsule (100 mg total) by mouth 2 (two) times daily as needed for cough. Patient not taking: Reported on 01/29/2019 12/23/18   Park Liter P, DO  montelukast (SINGULAIR) 10 MG tablet Take 1 tablet (10 mg total) by mouth at bedtime. Patient not taking: Reported on 01/29/2019 12/06/18   Park Liter P, DO    Allergies Codeine, Hydrocodone, and Oxycodone  Family History  Problem Relation Age of Onset  . Diabetes Father   . Lung disease Mother   . Diabetes Sister   . Diabetes Maternal Aunt     Social History Social History   Tobacco Use  . Smoking status: Former Smoker    Packs/day: 1.00    Years: 20.00    Pack years: 20.00    Types: Cigarettes    Quit date: 01/05/1986    Years since quitting: 33.0  . Smokeless tobacco: Never Used  Substance Use Topics  . Alcohol use: No  . Drug use: No    Review of Systems Constitutional: No fever/chills Eyes: No visual changes. ENT: No sore throat. Cardiovascular: See HPI Respiratory: Denies shortness of breath. Gastrointestinal: No abdominal pain but felt like he had a giant bubble coming from his stomach, but this gotten better.   Genitourinary: Negative for dysuria. Musculoskeletal: Negative for back pain. Skin: Negative for rash. Neurological: Negative for headaches.    ____________________________________________   PHYSICAL EXAM:  VITAL SIGNS: ED Triage Vitals  Enc Vitals Group     BP 01/29/19 0322 (!) 161/105     Pulse Rate 01/29/19 0322 (!) 107     Resp 01/29/19 0322 (!) 22     Temp 01/29/19 0322 98.2 F (36.8 C)     Temp Source 01/29/19 0322 Oral     SpO2 01/29/19 0322 99 %     Weight 01/29/19 0324 150 lb (68 kg)     Height 01/29/19 0324 5\' 7"  (1.702 m)     Head Circumference --      Peak Flow --      Pain Score 01/29/19 0323 5     Pain Loc --       Pain Edu? --      Excl. in [place]? --     Constitutional: Alert and oriented. Well appearing and in no acute distress.  He is very pleasant resting calmly. Eyes: Conjunctivae are normal. Head: Atraumatic. Nose: No congestion/rhinnorhea. Mouth/Throat: Mucous membranes are moist. Neck: No stridor.  Cardiovascular: Slightly tachycardic rate, regular rhythm. Grossly normal heart sounds.  Good peripheral circulation. Respiratory: Normal respiratory effort.  No retractions. Lungs CTAB. Gastrointestinal: Soft and nontender. No distention. Musculoskeletal: No lower extremity tenderness nor edema. Neurologic:  Normal speech and language. No gross focal neurologic deficits are appreciated.  Skin:  Skin is warm, dry and intact. No rash noted. Psychiatric: Mood and affect are normal. Speech and  behavior are normal.  ____________________________________________   LABS (all labs ordered are listed, but only abnormal results are displayed)  Labs Reviewed  BASIC METABOLIC PANEL - Abnormal; Notable for the following components:      Result Value   Potassium 3.4 (*)    Calcium 10.4 (*)    All other components within normal limits  CBC - Abnormal; Notable for the following components:   WBC 11.3 (*)    MCV 79.2 (*)    All other components within normal limits  FIBRIN DERIVATIVES D-DIMER ([facility] ONLY) - Abnormal; Notable for the following components:   Fibrin derivatives D-dimer [facility]) 693.37 (*)    All other components within normal limits  TROPONIN I (HIGH SENSITIVITY) - Abnormal; Notable for the following components:   Troponin I (High Sensitivity) 44 (*)    All other components within normal limits  GLUCOSE, CAPILLARY  HEPATIC FUNCTION PANEL  LIPASE, BLOOD  GLUCOSE, CAPILLARY  CBG MONITORING, ED  TROPONIN I (HIGH SENSITIVITY)   ____________________________________________  EKG  Reviewed inter by me at 3:30 AM Heart rate 110 Curious 100 QTc 420 Sinus tachycardia, minimal nonspecific T  wave abnormality.  Compared with May 05, 2008, appears similar in morphology. ____________________________________________  RADIOLOGY  DG ABD ACUTE 2+V W 1V CHEST  Result Date: 01/29/2019 CLINICAL DATA:  Chest pain EXAM: DG ABDOMEN ACUTE W/ 1V CHEST COMPARISON:  None. FINDINGS: There is no evidence of dilated bowel loops or free intraperitoneal air. No radiopaque calculi or other significant radiographic abnormality is seen. Heart size and mediastinal contours are within normal limits. Both lungs are clear. IMPRESSION: Negative abdominal radiographs.  No acute cardiopulmonary disease. Electronically Signed   By: Ulyses Jarred M.D.   On: 01/29/2019 04:11     ____________________________________________   PROCEDURES  Procedure(s) performed: None  Procedures  Critical Care performed: No  ____________________________________________   INITIAL IMPRESSION / ASSESSMENT AND PLAN / ED COURSE  Pertinent labs & imaging results that were available during my care of the patient were reviewed by me and considered in my medical decision making (see chart for details).   Patient comes for evaluation of chest pain, rather abrupt in onset.  Looking over the left chest felt as a pressure but also associated with a bubble feeling that was relieved quite a bit by taking Pepto-Bismol.  No associated abdominal pain.  Reassuring clinical examination just slightly tachycardic.  He has no known history of coronary disease but does have risk factors with diabetes hypertension hyperlipidemia  Symptoms seem atypical of acute ACS and that is nonradiating not associated shortness of breath, reassuring examination, EKG without frank ischemic changes though some nonspecific T wave abnormalities quitting flattening.  We will check troponin, provide GI medications, send D-dimer as he has no signs or symptoms of PE and does not have a high pretest probability.  Question if this could be related to possibly some sort of  trapped gas or other from his recent colonoscopy as well.  Reassuring abdominal exam without abdominal pain.  Provide salicylate, GI medication, team further work-up including imaging such as chest x-ray abdominal imaging    Clinical Course as of Jan 28 706  Sun Jan 29, 2019  0545 Patient reports he is feeling much better.  Reports he has had a couple times that he is burped, and now his discomfort is down to a 1, almost completely gone.   [MQ]    Clinical Course User Index [MQ] Delman Kitten, MD   HEAR Score: 5  Patient heart past score elevated on second troponin.  Patient asymptomatic now, but certainly with his bump in his troponin with associated chest pain and his risk factors offered the patient admission and further work-up, patient understanding agreeable with plan for admission.  Currently resting comfortably.  Also given his Covid infection approximately 1 month ago, and his presentation today D-dimer was sent and this is positive, patient will undergo CT pulmonary embolism study with plan for admission thereafter, ongoing care signed to me Dr. Marjean Donna.    ____________________________________________   FINAL CLINICAL IMPRESSION(S) / ED DIAGNOSES  Final diagnoses:  Chest pain, moderate risk for acute coronary syndrome        Note:  This document was prepared using Dragon voice recognition software and may include unintentional dictation errors       Delman Kitten, MD 01/29/19 [phone]

## 2019-01-29 NOTE — Consult Note (Signed)
ANTICOAGULATION CONSULT NOTE - Initial Consult  Pharmacy Consult for Heparin Infusion  Indication: chest pain/ACS  Allergies  Allergen Reactions  . Codeine Nausea Only  . Hydrocodone Nausea Only  . Oxycodone Nausea Only    Patient Measurements: Height: 5\' 7"  (170.2 cm) Weight: 150 lb (68 kg) IBW/kg (Calculated) : 66.1  Vital Signs: Temp: 98.2 F (36.8 C) (01/24 0322) Temp Source: Oral (01/24 0322) BP: 145/99 (01/24 0800) Pulse Rate: 106 (01/24 0800)  Labs: Recent Labs    01/29/19 0328 01/29/19 0545  HGB 14.4  --   HCT 43.1  --   PLT 327  --   CREATININE 1.19  --   TROPONINIHS 15 44*    Estimated Creatinine Clearance: 65.6 mL/min (by C-G formula based on SCr of 1.19 mg/dL).   Medical History: Past Medical History:  Diagnosis Date  . Diabetes mellitus with renal complications ([place])   . ED (erectile dysfunction)   . Erectile dysfunction associated with type 2 diabetes mellitus ([place])   . Exotropia   . History of tobacco abuse   . Hyperlipidemia   . Hypertension   . Thoracic back pain   . Type II diabetes mellitus with renal manifestations, uncontrolled ([place]) 06/12/2014    Assessment: Pharmacy consulted for heparin infusion dosing and monitoring for 54 yo male for Chest Pain/NSTEMI.   Troponin I (HS): 15>> 44   Goal of Therapy:  Heparin level 0.3-0.7 units/ml Monitor platelets by anticoagulation protocol: Yes   Plan:  Baseline labs ordered Give 4000 units bolus x 1 Start heparin infusion at 800 units/hr Check anti-Xa level in 6 hours and daily while on heparin Continue to monitor H&H and platelets  Pernell Dupre, PharmD, BCPS Clinical Pharmacist 01/29/2019 8:52 AM

## 2019-01-29 NOTE — Progress Notes (Signed)
Patient received on Heparin gtt from the ED. Patient placed on monitor. No skin issues.

## 2019-01-30 DIAGNOSIS — R079 Chest pain, unspecified: Secondary | ICD-10-CM | POA: Diagnosis not present

## 2019-01-30 DIAGNOSIS — R000 Tachycardia, unspecified: Secondary | ICD-10-CM | POA: Diagnosis present

## 2019-01-30 DIAGNOSIS — I10 Essential (primary) hypertension: Secondary | ICD-10-CM | POA: Diagnosis not present

## 2019-01-30 DIAGNOSIS — E785 Hyperlipidemia, unspecified: Secondary | ICD-10-CM | POA: Diagnosis not present

## 2019-01-30 DIAGNOSIS — U071 COVID-19: Secondary | ICD-10-CM | POA: Diagnosis not present

## 2019-01-30 DIAGNOSIS — R002 Palpitations: Secondary | ICD-10-CM | POA: Diagnosis not present

## 2019-01-30 LAB — CBC
HCT: 37.6 % — ABNORMAL LOW (ref 39.0–52.0)
Hemoglobin: 12.2 g/dL — ABNORMAL LOW (ref 13.0–17.0)
MCH: 26.1 pg (ref 26.0–34.0)
MCHC: 32.4 g/dL (ref 30.0–36.0)
MCV: 80.3 fL (ref 80.0–100.0)
Platelets: 281 10*3/uL (ref 150–400)
RBC: 4.68 MIL/uL (ref 4.22–5.81)
RDW: 14.8 % (ref 11.5–15.5)
WBC: 6.3 10*3/uL (ref 4.0–10.5)
nRBC: 0 % (ref 0.0–0.2)

## 2019-01-30 LAB — HEPARIN LEVEL (UNFRACTIONATED): Heparin Unfractionated: 0.53 IU/mL (ref 0.30–0.70)

## 2019-01-30 LAB — TSH: TSH: 1.456 u[IU]/mL (ref 0.350–4.500)

## 2019-01-30 LAB — GLUCOSE, CAPILLARY
Glucose-Capillary: 146 mg/dL — ABNORMAL HIGH (ref 70–99)
Glucose-Capillary: 196 mg/dL — ABNORMAL HIGH (ref 70–99)

## 2019-01-30 MED ORDER — ASPIRIN 81 MG PO TBEC: 81.0000 mg | DELAYED_RELEASE_TABLET | Freq: Every day | ORAL | 0 refills | Status: DC

## 2019-01-30 NOTE — Consult Note (Addendum)
ANTICOAGULATION CONSULT NOTE - Initial Consult  Pharmacy Consult for Heparin Infusion  Indication: chest pain/ACS  Allergies  Allergen Reactions  . Codeine Nausea Only  . Hydrocodone Nausea Only  . Oxycodone Nausea Only    Patient Measurements: Height: 5\' 7"  (170.2 cm) Weight: 153 lb 12.8 oz (69.8 kg) IBW/kg (Calculated) : 66.1  Vital Signs: Temp: 97.8 F (36.6 C) (01/25 0434) Temp Source: Oral (01/25 0434) BP: 118/80 (01/25 0434) Pulse Rate: 86 (01/25 0434)  Labs: Recent Labs    01/29/19 0328 01/29/19 0328 01/29/19 0545 01/29/19 0545 01/29/19 0919 01/29/19 1141 01/29/19 1318 01/29/19 1627 01/29/19 2230 01/30/19 0616  HGB 14.4  --   --   --   --   --   --   --   --  12.2*  HCT 43.1  --   --   --   --   --   --   --   --  37.6*  PLT 327  --   --   --   --   --   --   --   --  281  APTT  --   --  26  --   --   --   --   --   --   --   LABPROT  --   --  12.2  --   --   --   --   --   --   --   INR  --   --  0.9  --   --   --   --   --   --   --   HEPARINUNFRC  --   --   --   --   --   --   --  0.38 0.27* 0.53  CREATININE 1.19  --   --   --   --   --   --   --   --   --   TROPONINIHS 15   < > 44*   < > 45* 43* 39*  --   --   --    < > = values in this interval not displayed.    Estimated Creatinine Clearance: 65.6 mL/min (by C-G formula based on SCr of 1.19 mg/dL).   Medical History: Past Medical History:  Diagnosis Date  . Diabetes mellitus with renal complications ([place])   . ED (erectile dysfunction)   . Erectile dysfunction associated with type 2 diabetes mellitus ([place])   . Exotropia   . History of tobacco abuse   . Hyperlipidemia   . Hypertension   . Thoracic back pain   . Type II diabetes mellitus with renal manifestations, uncontrolled ([place]) 06/12/2014    Assessment: Pharmacy consulted for heparin infusion dosing and monitoring for 54 yo male for Chest Pain/NSTEMI.   Troponin I (HS): 15>> 44   Goal of Therapy:  Heparin level 0.3-0.7  units/ml Monitor platelets by anticoagulation protocol: Yes   Plan:  Baseline labs ordered Give 4000 units bolus x 1 Start heparin infusion at 800 units/hr Check anti-Xa level in 6 hours and daily while on heparin Continue to monitor H&H and platelets  1/24: HL @ 1627 = 0.38 Will continue this pt on current rate and draw confirmation level on 1/24 @ 2230.  1/24 @ 2230 HL = 0.27, subtherapeutic.  Will increase Heparin to 1000 units/hr and recheck HL in 6 hrs 1/25 @ 0616 HL = 0.53, therapeutic x 1, H/H worse, Plts stable.  continue heparin at current rate and recheck HL in 6 hours to confirm.  Ena Dawley, PharmD Clinical Pharmacist 01/30/2019 7:19 AM

## 2019-01-30 NOTE — Progress Notes (Signed)
Progress Note  Patient Name: Arthur [name] Date of Encounter: 01/30/2019  Primary Cardiologist: new to [facility] - consult by [place]  Subjective   High-sensitivity troponin flat trending.  Echo demonstrated preserved LV systolic function with no significant effusion.  BP well controlled.  Inpatient Medications    Scheduled Meds: . aspirin EC  81 mg Oral Daily  . atorvastatin  80 mg Oral q1800  . fluticasone  2 spray Each Nare Daily  . insulin aspart  0-15 Units Subcutaneous TID WC  . insulin aspart  0-5 Units Subcutaneous QHS  . losartan  25 mg Oral Daily  . montelukast  10 mg Oral QHS  . pantoprazole (PROTONIX) IV  40 mg Intravenous Q12H  . sucralfate  1 g Oral TID WC & HS   Continuous Infusions:  PRN Meds: acetaminophen, benzonatate, hydrALAZINE, nitroGLYCERIN   Vital Signs    Vitals:   01/29/19 1553 01/29/19 2053 01/30/19 0434 01/30/19 0756  BP: 118/84 114/81 118/80 117/85  Pulse: 95 95 86 86  Resp: 18 19 18 19   Temp: (!) 97.5 F (36.4 C) 98.7 F (37.1 C) 97.8 F (36.6 C) (!) 97.4 F (36.3 C)  TempSrc: Oral Oral Oral Oral  SpO2: 99% 100% 100% 99%  Weight:      Height:        Intake/Output Summary (Last 24 hours) at 01/30/2019 1053 Last data filed at 01/30/2019 1018 Gross per 24 hour  Intake 185.23 ml  Output 3400 ml  Net -3214.77 ml   Filed Weights   01/29/19 0324 01/29/19 0846 01/29/19 1106  Weight: 68 kg 68 kg 69.8 kg    Telemetry    Sinus rhythm to sinus tachycardia with rates in the 80s to low 100s bpm- Personally Reviewed  ECG    No new tracings- Personally Reviewed  Physical Exam   For MD secondary to COVID-19 positive  Labs    Chemistry Recent Labs  Lab 01/29/19 0328 01/29/19 0338  NA 142  --   K 3.4*  --   CL 102  --   CO2 27  --   GLUCOSE 82  --   BUN 13  --   CREATININE 1.19  --   CALCIUM 10.4*  --   PROT  --  7.8  ALBUMIN  --  4.4  AST  --  22  ALT  --  22  ALKPHOS  --  95  BILITOT  --  0.5  GFRNONAA >60  --    GFRAA >60  --   ANIONGAP 13  --      Hematology Recent Labs  Lab 01/29/19 0328 01/30/19 0616  WBC 11.3* 6.3  RBC 5.44 4.68  HGB 14.4 12.2*  HCT 43.1 37.6*  MCV 79.2* 80.3  MCH 26.5 26.1  MCHC 33.4 32.4  RDW 14.6 14.8  PLT 327 281    Cardiac EnzymesNo results for input(s): TROPONINI in the last 168 hours. No results for input(s): TROPIPOC in the last 168 hours.   BNPNo results for input(s): BNP, PROBNP in the last 168 hours.   DDimer No results for input(s): DDIMER in the last 168 hours.   Radiology    CT Angio Chest PE W and/or Wo Contrast  Result Date: 01/29/2019 IMPRESSION: No evidence of pulmonary embolism. No evidence of acute cardiopulmonary disease. Aortic Atherosclerosis (ICD10-I70.0). Electronically Signed   By: Julian Hy M.D.   On: 01/29/2019 07:26   DG ABD ACUTE 2+V W 1V CHEST  Result Date: 01/29/2019  IMPRESSION: Negative abdominal radiographs.  No acute cardiopulmonary disease. Electronically Signed   By: Ulyses Jarred M.D.   On: 01/29/2019 04:11   Cardiac Studies   2D echo 01/29/2019:  1. Left ventricular ejection fraction, by visual estimation, is 60 to 65%. The left ventricle has normal function. There is mildly increased left ventricular hypertrophy.  2. Left ventricular diastolic parameters are consistent with Grade I diastolic dysfunction (impaired relaxation).  3. The left ventricle has no regional wall motion abnormalities.  4. Global right ventricle has normal systolic function.The right ventricular size is normal. No increase in right ventricular wall thickness.  5. Left atrial size was normal.  6. TR signal is inadequate for assessing pulmonary artery systolic pressure.  Patient Profile     54 y.o. male with history of diabetes with recent COVID-19 infection diagnosed 01/10/2019 admitted with chest pain and palpitations.  Assessment & Plan    1.  Chest pain/elevated high-sensitivity troponin: -Patient has not described chest pain to  cardiology, rather more so palpitations -High-sensitivity troponin was minimally elevated peaking at 45 and not consistent with ACS -From a cardiac perspective, discontinue heparin drip -Echo showed preserved LV systolic function with no evidence of significant effusion -CTA chest negative for PE -We will discuss with MD Carlton Adam Myoview prior to discharge on 1/26 versus outpatient stress testing as patient is not n.p.o. and has already eaten this morning -ASA -Atorvastatin  2.  Palpitations: -No significant arrhythmia noted on telemetry -Echo without any significant structural abnormalities this admission -Check TSH -Recommend repletion of potassium to goal 4.0, patient has received IV potassium this admission -When patient is seen in follow-up to consider Zio patch  3.  COVID-19 infection: -Tested positive on 01/10/2019 -Per internal medicine  4.  HLD: -Continue PTA statin   For questions or updates, please contact [place] Please consult [email or web address] for contact info under Cardiology/STEMI.    Signed, Christell Faith, PA-C [place] Pager: [phone] 01/30/2019, 10:53 AM

## 2019-01-30 NOTE — Discharge Summary (Signed)
Physician Discharge Summary  Arthur [name] [id number] DOB: 10-Aug-1965 DOA: 01/29/2019  PCP: Valerie Roys, DO  Admit date: 01/29/2019 Discharge date: 01/30/2019  Admitted From: Home Disposition: Home  Recommendations for Outpatient Follow-up:  1. Follow-up with PCP in 2 weeks. 2. Cardiology office will call for outpatient stress test and Zio patch within the next 2-3 weeks.  Home Health: None Equipment/Devices: None  Discharge Condition: Fair CODE STATUS: Full code Diet recommendation: Heart healthy/carb modified    Discharge Diagnoses:  Principal Problem:   Chest pain at rest  Active Problems:   Type II diabetes mellitus with renal manifestations, uncontrolled (HCC)   Dyslipidemia   Hypertension   Hypokalemia   COVID-19   Tachyarrhythmia  Brief narrative/HPI 54 year old male with history of type 2 diabetes mellitus, hypertension, dyslipidemia, tested positive for COVID-19 few weeks back who presented with chest discomfort since the night prior to admission.  He felt funny in his chest and checked his sugar which was 70.  He then started to have palpitations associated with nausea.  EMS was called and by the time they arrived he was feeling better. In the ED he did not have any further chest pain symptoms.  He had elevated blood pressure 130/100 mmHg, mildly tachycardic with labs showing potassium of 3.4, calcium of 10.4 and troponin of 44. Chest x-ray unremarkable.  He was given full dose aspirin, Zofran, Mylanta.  CT angiogram of the chest were negative for PE or acute infiltrate. Placed in observation for further management.  He was started on IV heparin with concern for unstable angina/NSTEMI.  Cardiology consulted  Hospital course Chest pain at rest Suspect this is associated with tachyarrhythmia and not due to ACS.  No further pain symptoms.  Patient started on baby aspirin.  Continued his losartan and statin. Has been stable on telemetry.  2D echo with normal  EF and no wall motion abnormality.  Subsequent troponins have been flat.  IV heparin discontinued. Discussed with cardiology recommends outpatient stress test and Zio patch. Patient stable to be discharged home  Palpitations As outlined above.  Follow TSH.  Low potassium replenished  Recent COVID-19 infection Tested positive on 1/5.  Asymptomatic.  Type 2 diabetes mellitus, controlled A1c of 7.7. Resume home meds.  Disposition: Home Consult: Cardiology Procedures: 2D echo, CT angiogram chest  Discharge Instructions   Allergies as of 01/30/2019      Reactions   Codeine Nausea Only   Hydrocodone Nausea Only   Oxycodone Nausea Only      Medication List    TAKE these medications   aspirin 81 MG EC tablet Take 1 tablet (81 mg total) by mouth daily. Start taking on: January 31, 2019   atorvastatin 40 MG tablet Commonly known as: LIPITOR Take 2 tablets (80 mg total) by mouth daily.   benzonatate 100 MG capsule Commonly known as: TESSALON Take 1 capsule (100 mg total) by mouth 2 (two) times daily as needed for cough.   chlorpheniramine-HYDROcodone 10-8 MG/5ML Suer Commonly known as: Tussionex Pennkinetic ER Take 5 mLs by mouth at bedtime as needed.   empagliflozin 25 MG Tabs tablet Commonly known as: JARDIANCE Take 25 mg by mouth daily.   fluticasone 50 MCG/ACT nasal spray Commonly known as: FLONASE Place 2 sprays into both nostrils daily.   glucose blood test strip 1 each by Other route as needed for other. One touch ultra test strips. Use as instructed.   losartan 25 MG tablet Commonly known as: Cozaar Take 1 tablet (25  mg total) by mouth daily.   metFORMIN 1000 MG tablet Commonly known as: GLUCOPHAGE TAKE 1 TABLET BY MOUTH TWICE DAILY   methocarbamol 500 MG tablet Commonly known as: ROBAXIN Take by mouth 2 (two) times daily.   montelukast 10 MG tablet Commonly known as: SINGULAIR Take 1 tablet (10 mg total) by mouth at bedtime.   NovoFine Plus 32G X  4 MM Misc Generic drug: Insulin Pen Needle Use one needle daily with insulin   sildenafil 100 MG tablet Commonly known as: Viagra Take 0.5-1 tablets (50-100 mg total) by mouth daily as needed for erectile dysfunction.   Xultophy 100-3.6 UNIT-MG/ML Sopn Generic drug: Insulin Degludec-Liraglutide Inject 50 Units into the skin at bedtime.      Follow-up Information    Wellington Hampshire, MD Follow up in 2 week(s).   Specialty: Cardiology Why: office will call Contact information: [place] [address] [phone]        Park Liter P, DO. Schedule an appointment as soon as possible for a visit in 2 week(s).   Specialty: Family Medicine Contact information: [place] [address] [phone]          Allergies  Allergen Reactions  . Codeine Nausea Only  . Hydrocodone Nausea Only  . Oxycodone Nausea Only        Procedures/Studies: CT Angio Chest PE W and/or Wo Contrast  Result Date: 01/29/2019 CLINICAL DATA:  Chest pain, shortness of breath, tachycardia. COVID positive in December. EXAM: CT ANGIOGRAPHY CHEST WITH CONTRAST TECHNIQUE: Multidetector CT imaging of the chest was performed using the standard protocol during bolus administration of intravenous contrast. Multiplanar CT image reconstructions and MIPs were obtained to evaluate the vascular anatomy. CONTRAST:  36mL OMNIPAQUE IOHEXOL 350 MG/ML SOLN COMPARISON:  Chest radiographs dated 02/14/2014 FINDINGS: Cardiovascular: Satisfactory opacification of the bilateral pulmonary arteries to the segmental level. Mild motion degradation in the bilateral lower lobes. No evidence of pulmonary embolism. No evidence of thoracic aortic aneurysm or dissection. Mild atherosclerotic calcifications of the aortic arch. The heart is normal in size.  No pericardial effusion. Mediastinum/Nodes: No suspicious mediastinal lymphadenopathy. Visualized thyroid is unremarkable. Lungs/Pleura: Lungs are  essentially clear. Minimal dependent atelectasis in the bilateral lower lobes. No focal consolidation. Nodular scarring versus atelectasis at the right lung base (series 6/image 66). No suspicious pulmonary nodules. No centrilobular or paraseptal emphysematous changes. No pleural effusion or pneumothorax. Upper Abdomen: Visualized upper abdomen is unremarkable. Musculoskeletal: Degenerative changes of the visualized thoracolumbar spine. Review of the MIP images confirms the above findings. IMPRESSION: No evidence of pulmonary embolism. No evidence of acute cardiopulmonary disease. Aortic Atherosclerosis (ICD10-I70.0). Electronically Signed   By: Julian Hy M.D.   On: 01/29/2019 07:26   DG ABD ACUTE 2+V W 1V CHEST  Result Date: 01/29/2019 CLINICAL DATA:  Chest pain EXAM: DG ABDOMEN ACUTE W/ 1V CHEST COMPARISON:  None. FINDINGS: There is no evidence of dilated bowel loops or free intraperitoneal air. No radiopaque calculi or other significant radiographic abnormality is seen. Heart size and mediastinal contours are within normal limits. Both lungs are clear. IMPRESSION: Negative abdominal radiographs.  No acute cardiopulmonary disease. Electronically Signed   By: Ulyses Jarred M.D.   On: 01/29/2019 04:11   ECHOCARDIOGRAM COMPLETE  Result Date: 01/29/2019   ECHOCARDIOGRAM REPORT   Patient Name:   JAECE HEREDIA Date of Exam: 01/29/2019 Medical Rec #:  [id number]        Height:  67.0 in Accession #:    [id number]       Weight:       150.0 lb Date of Birth:  May 20, 1965        BSA:          1.79 m Patient Age:    86 years         BP:           145/99 mmHg Patient Gender: M                HR:           104 bpm. Exam Location:  [facility] Procedure: 2D Echo Indications:     Chest Pain 785.50/ R07.9  History:         Patient has no prior history of Echocardiogram examinations.                  Risk Factors:Diabetes, Dyslipidemia and Hypertension.  Sonographer:     Avanell Shackleton Referring Phys:  [id number] [place] Diagnosing Phys: Ida Rogue MD IMPRESSIONS  1. Left ventricular ejection fraction, by visual estimation, is 60 to 65%. The left ventricle has normal function. There is mildly increased left ventricular hypertrophy.  2. Left ventricular diastolic parameters are consistent with Grade I diastolic dysfunction (impaired relaxation).  3. The left ventricle has no regional wall motion abnormalities.  4. Global right ventricle has normal systolic function.The right ventricular size is normal. No increase in right ventricular wall thickness.  5. Left atrial size was normal.  6. TR signal is inadequate for assessing pulmonary artery systolic pressure. FINDINGS  Left Ventricle: Left ventricular ejection fraction, by visual estimation, is 60 to 65%. The left ventricle has normal function. The left ventricle has no regional wall motion abnormalities. There is mildly increased left ventricular hypertrophy. Left ventricular diastolic parameters are consistent with Grade I diastolic dysfunction (impaired relaxation). Normal left atrial pressure. Right Ventricle: The right ventricular size is normal. No increase in right ventricular wall thickness. Global RV systolic function is has normal systolic function. Left Atrium: Left atrial size was normal in size. Right Atrium: Right atrial size was normal in size Pericardium: There is no evidence of pericardial effusion. Mitral Valve: The mitral valve is normal in structure. No evidence of mitral valve regurgitation. No evidence of mitral valve stenosis by observation. Tricuspid Valve: The tricuspid valve is normal in structure. Tricuspid valve regurgitation is not demonstrated. Aortic Valve: The aortic valve is normal in structure. Aortic valve regurgitation is not visualized. The aortic valve is structurally normal, with no evidence of sclerosis or stenosis. Pulmonic Valve: The pulmonic valve was normal in structure. Pulmonic valve regurgitation is not visualized. Pulmonic  regurgitation is not visualized. Aorta: The aortic root, ascending aorta and aortic arch are all structurally normal, with no evidence of dilitation or obstruction. Venous: The inferior vena cava is normal in size with greater than 50% respiratory variability, suggesting right atrial pressure of 3 mmHg. IAS/Shunts: No atrial level shunt detected by color flow Doppler. There is no evidence of a patent foramen ovale. No ventricular septal defect is seen or detected. There is no evidence of an atrial septal defect.  LEFT VENTRICLE PLAX 2D LVIDd:         4.11 cm  Diastology LVIDs:         1.96 cm  LV e' lateral:   6.96 cm/s LV PW:         1.20 cm  LV E/e' lateral: 11.2 LV  IVS:        1.05 cm  LV e' medial:    7.62 cm/s LVOT diam:     1.90 cm  LV E/e' medial:  10.2 LV SV:         63 ml LV SV Index:   34.78 LVOT Area:     2.84 cm  RIGHT VENTRICLE             IVC RV S prime:     15.50 cm/s  IVC diam: 1.47 cm TAPSE (M-mode): 2.2 cm LEFT ATRIUM             Index       RIGHT ATRIUM           Index LA diam:        3.70 cm 2.07 cm/m  RA Area:     10.70 cm LA Vol (A2C):   27.3 ml 15.26 ml/m RA Volume:   22.30 ml  12.46 ml/m LA Vol (A4C):   27.0 ml 15.09 ml/m LA Biplane Vol: 29.5 ml 16.48 ml/m   AORTA Ao Root diam: 2.70 cm MITRAL VALVE MV Area (PHT): 6.54 cm              SHUNTS MV PHT:        33.64 msec            Systemic Diam: 1.90 cm MV Decel Time: 116 msec MV E velocity: 77.70 cm/s  103 cm/s MV A velocity: 113.00 cm/s 70.3 cm/s MV E/A ratio:  0.69        1.5  Ida Rogue MD Electronically signed by Ida Rogue MD Signature Date/Time: 01/29/2019/12:59:27 PM    Final        Subjective: Denies further chest pain symptoms or palpitations.  Stable on telemetry.  Discharge Exam: Vitals:   01/30/19 0434 01/30/19 0756  BP: 118/80 117/85  Pulse: 86 86  Resp: 18 19  Temp: 97.8 F (36.6 C) (!) 97.4 F (36.3 C)  SpO2: 100% 99%   Vitals:   01/29/19 1553 01/29/19 2053 01/30/19 0434 01/30/19 0756  BP:  118/84 114/81 118/80 117/85  Pulse: 95 95 86 86  Resp: 18 19 18 19   Temp: (!) 97.5 F (36.4 C) 98.7 F (37.1 C) 97.8 F (36.6 C) (!) 97.4 F (36.3 C)  TempSrc: Oral Oral Oral Oral  SpO2: 99% 100% 100% 99%  Weight:      Height:        General: Middle-aged male not in distress HEENT: Moist mucosa, supple neck Chest: Clear CVs: Normal S1-S2, no murmurs rub or gallop GI: Soft, nondistended, nontender Musculoskeletal: Warm, no edema    The results of significant diagnostics from this hospitalization (including imaging, microbiology, ancillary and laboratory) are listed below for reference.     Microbiology: No results found for this or any previous visit (from the past 240 hour(s)).   Labs: BNP (last 3 results) No results for input(s): BNP in the last 8760 hours. Basic Metabolic Panel: Recent Labs  Lab 01/29/19 0328  NA 142  K 3.4*  CL 102  CO2 27  GLUCOSE 82  BUN 13  CREATININE 1.19  CALCIUM 10.4*   Liver Function Tests: Recent Labs  Lab 01/29/19 0338  AST 22  ALT 22  ALKPHOS 95  BILITOT 0.5  PROT 7.8  ALBUMIN 4.4   Recent Labs  Lab 01/29/19 0338  LIPASE 43   No results for input(s): AMMONIA in the last 168 hours. CBC: Recent Labs  Lab 01/29/19 0328 01/30/19 0616  WBC 11.3* 6.3  HGB 14.4 12.2*  HCT 43.1 37.6*  MCV 79.2* 80.3  PLT 327 281   Cardiac Enzymes: No results for input(s): CKTOTAL, CKMB, CKMBINDEX, TROPONINI in the last 168 hours. BNP: Invalid input(s): POCBNP CBG: Recent Labs  Lab 01/29/19 1043 01/29/19 1608 01/29/19 2055 01/30/19 0754 01/30/19 1120  GLUCAP 125* 106* 139* 146* 196*   D-Dimer No results for input(s): DDIMER in the last 72 hours. Hgb A1c Recent Labs    01/29/19 0328  HGBA1C 7.7*   Lipid Profile No results for input(s): CHOL, HDL, LDLCALC, TRIG, CHOLHDL, LDLDIRECT in the last 72 hours. Thyroid function studies No results for input(s): TSH, T4TOTAL, T3FREE, THYROIDAB in the last 72 hours.  Invalid  input(s): FREET3 Anemia work up No results for input(s): VITAMINB12, FOLATE, FERRITIN, TIBC, IRON, RETICCTPCT in the last 72 hours. Urinalysis    Component Value Date/Time   APPEARANCEUR Clear 06/24/2018 0906   GLUCOSEU Negative 06/24/2018 0906   BILIRUBINUR Negative 06/24/2018 0906   PROTEINUR Negative 06/24/2018 0906   NITRITE Negative 06/24/2018 0906   LEUKOCYTESUR Negative 06/24/2018 0906   Sepsis Labs Invalid input(s): PROCALCITONIN,  WBC,  LACTICIDVEN Microbiology No results found for this or any previous visit (from the past 240 hour(s)).   Time coordinating discharge: 25 minutes  SIGNED:   Louellen Molder, MD  [facility] 01/30/2019, 11:34 AM Pager   If 7PM-7AM, please contact night-coverage [email or web address] Password [email or web address]

## 2019-01-30 NOTE — Plan of Care (Signed)
  Problem: Education: Goal: Understanding of cardiac disease, CV risk reduction, and recovery process will improve Outcome: Adequate for Discharge   Problem: Education: Goal: Understanding of medication regimen will improve Outcome: Adequate for Discharge   Problem: Activity: Goal: Ability to tolerate increased activity will improve Outcome: Adequate for Discharge

## 2019-01-31 ENCOUNTER — Telehealth: Payer: Self-pay

## 2019-01-31 DIAGNOSIS — R079 Chest pain, unspecified: Secondary | ICD-10-CM

## 2019-01-31 NOTE — Telephone Encounter (Signed)
Prior Authorization initiated via CoverMyMeds for Xultophy 100/3.6 Units-mg/mL Key: [id number]

## 2019-01-31 NOTE — Telephone Encounter (Signed)
Scheduled 1/28 lexi  2/2 ov with ryan   Alert on file for recent covid testing positive but patient states he was told this could be a false positive now as he was sick in December.     Patient aware scheduling unsure of the most recent outcome of covid testing for now and if administration or clinical needs to change anything they will make that decision and give him a call.

## 2019-01-31 NOTE — Telephone Encounter (Signed)
-----   Message from Rise Mu, PA-C sent at 01/30/2019  3:41 PM EST ----- Please schedule patient for Arthur [name] in 1 to 2 weeks.  Diagnosis chest pain.  He should follow-up with our office after his stress test.

## 2019-01-31 NOTE — Telephone Encounter (Signed)
Call to patient to make him aware that Christell Faith, PA would like for him to have a stress test after he was recently seen in the ED with [facility].   I will route to scheduling to make aware since I was unable to reach patient by phone.   I will need to speak with patient after scheduling to review instructions.

## 2019-01-31 NOTE — Telephone Encounter (Signed)
PA approved.

## 2019-01-31 NOTE — Telephone Encounter (Signed)
Call to patient to make him aware that I had discussed situation with Christell Faith, PA.  Pt okay to go ahead and set him up for myoview and follow up appt.   1st initial cv 19 positive was 12/17 (under media ) in chart review.  2nd test was taken 1/5 which came back positive. Last sx per pt was 12/17.  No current covid like sx at this time.   I have called patient to make him aware that it is okay to proceed with POC. Reviewed instructions for myoview and sent in mychart as well.   No further questions at this time.   Advised pt to call for any further questions or concerns.    [place]  Your caregiver has ordered a Stress Test with nuclear imaging. The purpose of this test is to evaluate the blood supply to your heart muscle. This procedure is referred to as a "Non-Invasive Stress Test." This is because other than having an IV started in your vein, nothing is inserted or "invades" your body. Cardiac stress tests are done to find areas of poor blood flow to the heart by determining the extent of coronary artery disease (CAD). Some patients exercise on a treadmill, which naturally increases the blood flow to your heart, while others who are  unable to walk on a treadmill due to physical limitations have a pharmacologic/chemical stress agent called Lexiscan . This medicine will mimic walking on a treadmill by temporarily increasing your coronary blood flow.   Please note: these test may take anywhere between 2-4 hours to complete  PLEASE REPORT TO [facility] AT THE FIRST DESK WILL DIRECT YOU WHERE TO GO  Date of Procedure:_____________________________________  Arrival Time for Procedure:______________________________  Instructions regarding medication:   ___x_ : Hold diabetes medication the evening prior and morning of procedure (metformin)  ____:  Hold betablocker(s) night before procedure and morning of procedure  ___x_:  Hold other medications as  follows:_______viagra____do not take within 24 hours of your procedure.   PLEASE NOTIFY THE OFFICE AT LEAST 56 HOURS IN ADVANCE IF YOU ARE UNABLE TO KEEP YOUR APPOINTMENT.  [phone] AND  PLEASE NOTIFY NUCLEAR MEDICINE AT [facility] AT LEAST 24 HOURS IN ADVANCE IF YOU ARE UNABLE TO KEEP YOUR APPOINTMENT. [phone]  How to prepare for your Myoview test:  1. Do not eat or drink after midnight 2. No caffeine for 24 hours prior to test 3. No smoking 24 hours prior to test. 4. Your medication may be taken with water.  If your doctor stopped a medication because of this test, do not take that medication. 5. Ladies, please do not wear dresses.  Skirts or pants are appropriate. Please wear a short sleeve shirt. 6. No perfume, cologne or lotion. 7. Wear comfortable walking shoes. No heels!

## 2019-02-01 NOTE — Telephone Encounter (Signed)
Patient declined to schedule after quarantine period is over from positive test on 01/31/19.  He states he will maybe always test positive for covid and this is ridiculous.    Patient would like to talk with someone that can help him like administration or clinical as he is not getting the response he needs from the scheduler.

## 2019-02-01 NOTE — Telephone Encounter (Signed)
Can you advise on how you would like to precede with this patient as he had a positive COVID test yesterday. Pt's first COVID positive test was 01/10/19.

## 2019-02-01 NOTE — Telephone Encounter (Signed)
Spoke face to face with Arthur [name] in scheduling to give her update on patients situation. Okay to go ahead and schedule. Pt is past quarantine period.

## 2019-02-02 ENCOUNTER — Other Ambulatory Visit: Payer: Self-pay

## 2019-02-02 ENCOUNTER — Other Ambulatory Visit: Payer: Managed Care, Other (non HMO)

## 2019-02-02 ENCOUNTER — Encounter
Admission: RE | Admit: 2019-02-02 | Discharge: 2019-02-02 | Disposition: A | Discharge status: Home / Self Care | Payer: Managed Care, Other (non HMO) | Source: Ambulatory Visit | Attending: Physician Assistant | Admitting: Physician Assistant

## 2019-02-02 DIAGNOSIS — R079 Chest pain, unspecified: Secondary | ICD-10-CM

## 2019-02-02 NOTE — Progress Notes (Deleted)
Cardiology Office Note    Date:  02/03/2019   ID:  Arthur [name], DOB 01-21-1965, MRN [id number]  PCP:  Valerie Roys, DO  Cardiologist:  Consult by Dr. Percival Spanish, MD  Electrophysiologist:  None   Chief Complaint: Hospital follow up  History of Present Illness:   Arthur [name] is a 54 y.o. male with history of diabetes, HTN, HLD and recent COVID-1 pneumonia in 12/2018 who presents for hospital follow-up as detailed below.  He was admitted to the hospital 1/24 through 1/25 with chest pain and palpitations in the setting of hypoglycemia with blood sugar of 70.  By the time EMS arrived he was feeling better after eating.  Initial high-sensitivity troponin XV with a delta of 44 peaking at 45 and subsequently downtrending.  D-dimer elevated at 623.37.  CTA chest was negative for PE with noted aortic atherosclerosis.  He was treated with heparin infusion.  EKG showed sinus tachycardia with no acute ST-T wave changes.  Echo showed an EF of 60 to 65%, mild LVH, grade 1 diastolic dysfunction, no regional wall motion abnormalities, normal RVSF and cavity size, unable to estimate PASP.  There was also some confusion regarding his actual initial date of diagnosis of COVID-19.  Given this, his stress test was recommended to be deferred to outpatient setting.  Chart biopsy indicates he was diagnosed with COVID-19 on 12/22/2018.  He underwent outpatient MPI on 02/03/2019 which showed ***.  *** zio  Labs independently reviewed: 01/2019 - TSH normal, Hgb 12.2, PLT 281, albumin 4.4, AST/LT normal, A1c 7.7, potassium 3.2, BUN 13, serum creatinine 1.19 12/2018 - TC 121, TG 204, HDL 36, LDL 59  Past Medical History:  Diagnosis Date  . Diabetes mellitus with renal complications ([place])   . Erectile dysfunction associated with type 2 diabetes mellitus ([place])   . Exotropia   . History of tobacco abuse   . Hyperlipidemia   . Hypertension   . Thoracic back pain     Past Surgical History:  Procedure  Laterality Date  . COLONOSCOPY WITH PROPOFOL N/A 01/25/2019   Procedure: COLONOSCOPY WITH PROPOFOL;  Surgeon: Lin Landsman, MD;  Location: [facility] ENDOSCOPY;  Service: Gastroenterology;  Laterality: N/A;  Positive on 12/22/2018  . HIP SURGERY    . SHOULDER SURGERY Left    bone spurs removed    Current Medications: No outpatient medications have been marked as taking for the 02/07/19 encounter (Appointment) with Rise Mu, PA-C.    Allergies:   Codeine, Hydrocodone, and Oxycodone   Social History   Socioeconomic History  . Marital status: Married    Spouse name: Not on file  . Number of children: Not on file  . Years of education: Not on file  . Highest education level: Not on file  Occupational History  . Not on file  Tobacco Use  . Smoking status: Former Smoker    Packs/day: 1.00    Years: 20.00    Pack years: 20.00    Types: Cigarettes    Quit date: 01/05/1986    Years since quitting: 33.1  . Smokeless tobacco: Never Used  Substance and Sexual Activity  . Alcohol use: No  . Drug use: No  . Sexual activity: Yes  Other Topics Concern  . Not on file  Social History Narrative  . Not on file   Social Determinants of Health   Financial Resource Strain:   . Difficulty of Paying Living Expenses: Not on file  Food Insecurity:   .  Worried About Charity fundraiser in the Last Year: Not on file  . Ran Out of Food in the Last Year: Not on file  Transportation Needs:   . Lack of Transportation (Medical): Not on file  . Lack of Transportation (Non-Medical): Not on file  Physical Activity:   . Days of Exercise per Week: Not on file  . Minutes of Exercise per Session: Not on file  Stress:   . Feeling of Stress : Not on file  Social Connections:   . Frequency of Communication with Friends and Family: Not on file  . Frequency of Social Gatherings with Friends and Family: Not on file  . Attends Religious Services: Not on file  . Active Member of Clubs or Organizations:  Not on file  . Attends Archivist Meetings: Not on file  . Marital Status: Not on file     Family History:  The patient's family history includes Diabetes in his father, maternal aunt, and sister; Lung disease in his mother.  ROS:   ROS   EKGs/Labs/Other Studies Reviewed:    Studies reviewed were summarized above. The additional studies were reviewed today:  2D Echo 01/2019: 1. Left ventricular ejection fraction, by visual estimation, is 60 to  65%. The left ventricle has normal function. There is mildly increased  left ventricular hypertrophy.  2. Left ventricular diastolic parameters are consistent with Grade I  diastolic dysfunction (impaired relaxation).  3. The left ventricle has no regional wall motion abnormalities.  4. Global right ventricle has normal systolic function.The right  ventricular size is normal. No increase in right ventricular wall  thickness.  5. Left atrial size was normal.  6. TR signal is inadequate for assessing pulmonary artery systolic  pressure.  __________  Pharmacological MPI 02/03/2019: Pharmacological myocardial perfusion imaging study with no significant  ischemia Normal wall motion, EF estimated at 37% (possibly depressed secondary to GI uptake artifact) No EKG changes concerning for ischemia at peak stress or in recovery No significant coronary calcification or aortic atherosclerosis noted Low risk scan   EKG:  EKG is ordered today.  The EKG ordered today demonstrates ***  Recent Labs: 01/29/2019: ALT 22; BUN 13; Creatinine, Ser 1.19; Potassium 3.4; Sodium 142 01/30/2019: Hemoglobin 12.2; Platelets 281; TSH 1.456  Recent Lipid Panel    Component Value Date/Time   CHOL 129 12/20/2018 1531   TRIG 204 (H) 12/20/2018 1531   HDL 36 (L) 12/20/2018 1531   LDLCALC 59 12/20/2018 1531    PHYSICAL EXAM:    VS:  There were no vitals taken for this visit.  BMI: There is no height or weight on file to calculate BMI.  Physical  Exam  Wt Readings from Last 3 Encounters:  01/29/19 153 lb 12.8 oz (69.8 kg)  01/25/19 149 lb 14.6 oz (68 kg)  01/24/19 150 lb (68 kg)     ASSESSMENT & PLAN:   1. ***  Disposition: F/u with Dr. Marland Kitchen in ***.   Medication Adjustments/Labs and Tests Ordered: Current medicines are reviewed at length with the patient today.  Concerns regarding medicines are outlined above. Medication changes, Labs and Tests ordered today are summarized above and listed in the Patient Instructions accessible in Encounters.   Signed, Christell Faith, PA-C 02/03/2019 2:07 PM     [facility] [address] [place] [address] [place], [place] [address] [phone]

## 2019-02-03 ENCOUNTER — Ambulatory Visit
Admission: RE | Admit: 2019-02-03 | Discharge: 2019-02-03 | Disposition: A | Discharge status: Home / Self Care | Payer: Managed Care, Other (non HMO) | Source: Ambulatory Visit | Attending: Physician Assistant | Admitting: Physician Assistant

## 2019-02-03 ENCOUNTER — Encounter: Payer: Self-pay | Admitting: Physician Assistant

## 2019-02-03 ENCOUNTER — Telehealth: Payer: Self-pay | Admitting: Family Medicine

## 2019-02-03 ENCOUNTER — Encounter: Payer: Self-pay | Admitting: Family Medicine

## 2019-02-03 DIAGNOSIS — R079 Chest pain, unspecified: Secondary | ICD-10-CM

## 2019-02-03 LAB — NM MYOCAR MULTI W/SPECT W/WALL MOTION / EF
Estimated workload: 1 METS
Exercise duration (min): 0 min
Exercise duration (sec): 0 s
LV dias vol: 57 mL (ref 62–150)
LV sys vol: 23 mL
MPHR: 165 {beats}/min
Peak HR: 136 {beats}/min
Percent HR: 82 %
Rest HR: 74 {beats}/min
SDS: 0
SRS: 0
SSS: 2
TID: 0.95

## 2019-02-03 MED ORDER — TECHNETIUM TC 99M TETROFOSMIN IV KIT
32.0400 | PACK | Freq: Once | INTRAVENOUS | Status: AC | PRN
  Administered 2019-02-03: 09:00:00 32.04 via INTRAVENOUS

## 2019-02-03 MED ORDER — TECHNETIUM TC 99M TETROFOSMIN IV KIT
10.0000 | PACK | Freq: Once | INTRAVENOUS | Status: AC | PRN
  Administered 2019-02-03: 08:00:00 10.84 via INTRAVENOUS

## 2019-02-03 MED ORDER — REGADENOSON 0.4 MG/5ML IV SOLN
0.4000 mg | Freq: Once | INTRAVENOUS | Status: AC
  Administered 2019-02-03: 09:00:00 0.4 mg via INTRAVENOUS

## 2019-02-03 NOTE — Telephone Encounter (Signed)
Pt would like to know what he should take for heartburn.

## 2019-02-05 NOTE — Telephone Encounter (Signed)
He can try OTC omeprazole and we'll talk about it.

## 2019-02-06 ENCOUNTER — Other Ambulatory Visit: Payer: Self-pay | Admitting: Family Medicine

## 2019-02-06 ENCOUNTER — Telehealth: Payer: Self-pay

## 2019-02-06 ENCOUNTER — Encounter: Payer: Self-pay | Admitting: Family Medicine

## 2019-02-06 NOTE — Telephone Encounter (Signed)
error 

## 2019-02-06 NOTE — Telephone Encounter (Signed)
-----   Message from Rise Mu, PA-C sent at 02/03/2019  5:15 PM EST ----- Stress test showed no significant ischemia EF falsely low with GI uptake artifact (recent echo this month with normal LVSF) Overall, low risk scan Follow up as planned

## 2019-02-06 NOTE — Telephone Encounter (Signed)
Incoming call returned by patient. Pt verbalized understanding.    No further orders at this time.   Confirmed appt for tom am.   Advised pt to call for any further questions or concerns.

## 2019-02-06 NOTE — Telephone Encounter (Signed)
Pt notified and stated that he would get the OTC medication.

## 2019-02-06 NOTE — Telephone Encounter (Signed)
Attempted to call patient. LMTCB 02/06/2019  Pt has appt tomorrow am.

## 2019-02-07 ENCOUNTER — Ambulatory Visit: Payer: Managed Care, Other (non HMO) | Admitting: Physician Assistant

## 2019-02-07 NOTE — Progress Notes (Signed)
Cardiology Office Note    Date:  02/08/2019   ID:  Arthur [name], Arthur [name] [birth year], MRN [id number]  PCP:  Valerie Roys, DO  Cardiologist:  Consult by Dr. Percival Spanish, MD Electrophysiologist:  None   Chief Complaint: Hospital follow-up  History of Present Illness:   Arthur [name] is a 54 y.o. male with history of diabetes, HTN, HLD and COVID-19 pneumonia in 12/2018 who presents for hospital follow-up as detailed below.  He was admitted to the hospital 1/24 through 1/25 with chest pain and palpitations in the setting of hypoglycemia with blood sugar of 70.  By the time EMS arrived he was feeling better after eating.  Initial high-sensitivity troponin 15 with a delta of 44 peaking at 45 and subsequently downtrending.  D-dimer elevated at 623.37.  CTA chest was negative for PE with noted aortic atherosclerosis.  He was treated with heparin infusion.  EKG showed sinus tachycardia with no acute ST-T wave changes.  Echo showed an EF of 60 to 65%, mild LVH, grade 1 diastolic dysfunction, no regional wall motion abnormalities, normal RVSF and cavity size, unable to estimate PASP.  There appeared to be some confusion regarding his actual initial date of diagnosis of COVID-19.  Given this, his stress test was recommended to be deferred to outpatient setting.  Chart biopsy indicates he was diagnosed with COVID-19 on 12/22/2018.  He underwent outpatient MPI on 02/03/2019 which showed no significant ischemia, no EKG changes concerning for ischemia at peak stress or recovery, no significant coronary artery calcification or aortic atherosclerosis noted on corrected CT images, EF possibly reduced at 37% secondary to GI uptake artifact with noted normal LVSF by echo as outlined above.  Overall, this was a low risk study.  He comes in doing well cardiac perspective.  Since his hospital discharge she has not had any further chest pain or "heart pounding" sensations.  He feels like he is "on the upswing."  He  reports leading up to his hospital admission he had not taken his regular diabetic medication and ate dinner.  He then took his regularly scheduled diabetic medication.  Following this, he had some dessert and was concerned about his blood sugars so he took an extra 40 units of insulin.  In this setting he began to feel dizzy/jittery with associated chest discomfort and palpitations.  Following a banana and some [facility] his blood sugars improved as well as his symptoms.  He subsequently underwent the above work-up while admitted.    Since his discharge, he reports his heart rate has been fluctuating between the 80s to 109 bpm.  No dizziness, presyncope, syncope.  No lower extremity swelling.  He had previously been changed from lisinopril to losartan by his PCP secondary to cough associated with ACE inhibitor.  With this, patient feels like his blood pressure was running on the high side and he has subsequently gone back to his previously dose of lisinopril 15 mg daily.  He has not noted a return of cough with this.   Labs independently reviewed: 01/2019 - TSH normal, Hgb 12.2, PLT 281, albumin 4.4, AST/ALT normal, A1c 7.7, potassium 3.2, BUN 13, serum creatinine 1.19 12/2018 - TC 121, TG 204, HDL 36, LDL 59   Past Medical History:  Diagnosis Date  . Diabetes mellitus with renal complications ([place])   . Erectile dysfunction associated with type 2 diabetes mellitus ([place])   . Exotropia   . History of tobacco abuse   . Hyperlipidemia   .  Hypertension   . Thoracic back pain     Past Surgical History:  Procedure Laterality Date  . COLONOSCOPY WITH PROPOFOL N/A 01/25/2019   Procedure: COLONOSCOPY WITH PROPOFOL;  Surgeon: Lin Landsman, MD;  Location: [facility] ENDOSCOPY;  Service: Gastroenterology;  Laterality: N/A;  Positive on 12/22/2018  . HIP SURGERY    . SHOULDER SURGERY Left    bone spurs removed    Current Medications: Current Meds  Medication Sig  . aspirin EC 81 MG EC tablet  Take 1 tablet (81 mg total) by mouth daily.  Marland Kitchen atorvastatin (LIPITOR) 40 MG tablet Take 2 tablets (80 mg total) by mouth daily.  . empagliflozin (JARDIANCE) 25 MG TABS tablet Take 25 mg by mouth daily.  Marland Kitchen glucose blood test strip 1 each by Other route as needed for other. One touch ultra test strips. Use as instructed.  . Insulin Degludec-Liraglutide (XULTOPHY) 100-3.6 UNIT-MG/ML SOPN Inject 50 Units into the skin at bedtime.  . Insulin Pen Needle (NOVOFINE PLUS) 32G X 4 MM MISC Use one needle daily with insulin  . lansoprazole (PREVACID) 30 MG capsule Take 30 mg by mouth daily at 12 noon.  Marland Kitchen losartan (COZAAR) 25 MG tablet Take 1 tablet (25 mg total) by mouth daily.  . metFORMIN (GLUCOPHAGE) 1000 MG tablet TAKE 1 TABLET BY MOUTH TWICE DAILY  . methocarbamol (ROBAXIN) 500 MG tablet Take by mouth 2 (two) times daily.   . sildenafil (VIAGRA) 100 MG tablet Take 0.5-1 tablets (50-100 mg total) by mouth daily as needed for erectile dysfunction.    Allergies:   Codeine, Hydrocodone, and Oxycodone   Social History   Socioeconomic History  . Marital status: Married    Spouse name: Not on file  . Number of children: Not on file  . Years of education: Not on file  . Highest education level: Not on file  Occupational History  . Not on file  Tobacco Use  . Smoking status: Former Smoker    Packs/day: 1.00    Years: 20.00    Pack years: 20.00    Types: Cigarettes    Quit date: 01/05/1986    Years since quitting: 33.1  . Smokeless tobacco: Never Used  Substance and Sexual Activity  . Alcohol use: No  . Drug use: No  . Sexual activity: Yes  Other Topics Concern  . Not on file  Social History Narrative  . Not on file   Social Determinants of Health   Financial Resource Strain:   . Difficulty of Paying Living Expenses: Not on file  Food Insecurity:   . Worried About Charity fundraiser in the Last Year: Not on file  . Ran Out of Food in the Last Year: Not on file  Transportation Needs:     . Lack of Transportation (Medical): Not on file  . Lack of Transportation (Non-Medical): Not on file  Physical Activity:   . Days of Exercise per Week: Not on file  . Minutes of Exercise per Session: Not on file  Stress:   . Feeling of Stress : Not on file  Social Connections:   . Frequency of Communication with Friends and Family: Not on file  . Frequency of Social Gatherings with Friends and Family: Not on file  . Attends Religious Services: Not on file  . Active Member of Clubs or Organizations: Not on file  . Attends Archivist Meetings: Not on file  . Marital Status: Not on file     Family History:  The patient's family history includes Diabetes in his father, maternal aunt, and sister; Lung disease in his mother.  ROS:   Review of Systems  Constitutional: Negative for chills, diaphoresis, fever, malaise/fatigue and weight loss.  HENT: Negative for congestion.   Eyes: Negative for discharge and redness.  Respiratory: Negative for cough, hemoptysis, sputum production, shortness of breath and wheezing.   Cardiovascular: Positive for chest pain and palpitations. Negative for orthopnea, claudication, leg swelling and PND.       Improved chest pain  Gastrointestinal: Negative for abdominal pain, blood in stool, heartburn, melena, nausea and vomiting.  Genitourinary: Negative for hematuria.  Musculoskeletal: Negative for falls and myalgias.  Skin: Negative for rash.  Neurological: Negative for dizziness, tingling, tremors, sensory change, speech change, focal weakness, loss of consciousness and weakness.  Endo/Heme/Allergies: Does not bruise/bleed easily.  Psychiatric/Behavioral: Negative for substance abuse. The patient is not nervous/anxious.   All other systems reviewed and are negative.    EKGs/Labs/Other Studies Reviewed:    Studies reviewed were summarized above. The additional studies were reviewed today:  2D Echo 01/2019: 1. Left ventricular ejection  fraction, by visual estimation, is 60 to  65%. The left ventricle has normal function. There is mildly increased  left ventricular hypertrophy.  2. Left ventricular diastolic parameters are consistent with Grade I  diastolic dysfunction (impaired relaxation).  3. The left ventricle has no regional wall motion abnormalities.  4. Global right ventricle has normal systolic function.The right  ventricular size is normal. No increase in right ventricular wall  thickness.  5. Left atrial size was normal.  6. TR signal is inadequate for assessing pulmonary artery systolic  pressure.  __________  Pharmacological MPI 02/03/2019: Pharmacological myocardial perfusion imaging study with no significant  ischemia Normal wall motion, EF estimated at 37% (possibly depressed secondary to GI uptake artifact) No EKG changes concerning for ischemia at peak stress or in recovery No significant coronary calcification or aortic atherosclerosis noted Low risk scan   EKG:  EKG is ordered today.  The EKG ordered today demonstrates NSR, 84 bpm, nonspecific inferolateral ST-T changes, normal  Recent Labs: 01/29/2019: ALT 22; BUN 13; Creatinine, Ser 1.19; Potassium 3.4; Sodium 142 01/30/2019: Hemoglobin 12.2; Platelets 281; TSH 1.456  Recent Lipid Panel    Component Value Date/Time   CHOL 129 12/20/2018 1531   TRIG 204 (H) 12/20/2018 1531   HDL 36 (L) 12/20/2018 1531   LDLCALC 59 12/20/2018 1531    PHYSICAL EXAM:    VS:  BP 124/78 (BP Location: Left Arm, Patient Position: Sitting, Cuff Size: Normal)   Temp 98.8 F (37.1 C)   Ht 5\' 7"  (1.702 m)   Wt 154 lb (69.9 kg)   SpO2 99%   BMI 24.12 kg/m   BMI: Body mass index is 24.12 kg/m.  Physical Exam  Constitutional: He is oriented to person, place, and time. He appears well-developed and well-nourished.  HENT:  Head: Normocephalic and atraumatic.  Eyes: Right eye exhibits no discharge. Left eye exhibits no discharge.  Neck: No JVD present.    Cardiovascular: Normal rate, regular rhythm, S1 normal, S2 normal and normal heart sounds. Exam reveals no distant heart sounds, no friction rub, no midsystolic click and no opening snap.  No murmur heard. Pulses:      Posterior tibial pulses are 2+ on the right side and 2+ on the left side.  Pulmonary/Chest: Effort normal and breath sounds normal. No respiratory distress. He has no decreased breath sounds. He has no wheezes. He  has no rales. He exhibits no tenderness.  Abdominal: Soft. He exhibits no distension. There is no abdominal tenderness.  Musculoskeletal:        General: No edema.     Cervical back: Normal range of motion.  Neurological: He is alert and oriented to person, place, and time.  Skin: Skin is warm and dry. No cyanosis. Nails show no clubbing.  Psychiatric: He has a normal mood and affect. His speech is normal and behavior is normal. Judgment and thought content normal.    Wt Readings from Last 3 Encounters:  02/08/19 154 lb (69.9 kg)  01/29/19 153 lb 12.8 oz (69.8 kg)  01/25/19 149 lb 14.6 oz (68 kg)     ASSESSMENT & PLAN:   1. Palpitations: Possibly in the setting of low blood sugar.  No further episodes.  Schedule Zio patch to evaluate for significant ectopy burden or any evidence of inappropriate sinus tach.  Recent TSH normal.  2. Chest pain: Symptoms resolved.  Low risk Lexiscan MPI with no evidence of coronary artery calcification on corrected CT images.  Continue risk factor modification.  Should symptoms return follow-up with PCP for noncardiac chest pain.  3. HTN: Patient has subsequently restarted prior lisinopril with reported improvement in BP.  Thus far, he seems to be tolerating this without cough.  He will follow up with his PCP on 2/4 regarding this.  4. HLD: LDL of 59.  Remains on atorvastatin with normal liver function.  Disposition: F/u with Dr. Fletcher Anon or APP in 6 weeks.   Medication Adjustments/Labs and Tests Ordered: Current medicines are  reviewed at length with the patient today.  Concerns regarding medicines are outlined above. Medication changes, Labs and Tests ordered today are summarized above and listed in the Patient Instructions accessible in Encounters.   Signed, Christell Faith, PA-C 02/08/2019 8:25 AM     [place] [address] [place] [address] [place], [place] [address] [phone]

## 2019-02-08 ENCOUNTER — Ambulatory Visit (INDEPENDENT_AMBULATORY_CARE_PROVIDER_SITE_OTHER): Payer: Managed Care, Other (non HMO) | Admitting: Physician Assistant

## 2019-02-08 ENCOUNTER — Other Ambulatory Visit: Payer: Self-pay

## 2019-02-08 ENCOUNTER — Encounter: Payer: Self-pay | Admitting: Physician Assistant

## 2019-02-08 ENCOUNTER — Ambulatory Visit (INDEPENDENT_AMBULATORY_CARE_PROVIDER_SITE_OTHER): Payer: Managed Care, Other (non HMO)

## 2019-02-08 VITALS — BP 124/78 | Temp 98.8°F | Ht 67.0 in | Wt 154.0 lb

## 2019-02-08 DIAGNOSIS — E785 Hyperlipidemia, unspecified: Secondary | ICD-10-CM | POA: Diagnosis not present

## 2019-02-08 DIAGNOSIS — R002 Palpitations: Secondary | ICD-10-CM

## 2019-02-08 DIAGNOSIS — I10 Essential (primary) hypertension: Secondary | ICD-10-CM

## 2019-02-08 DIAGNOSIS — R0789 Other chest pain: Secondary | ICD-10-CM

## 2019-02-08 MED ORDER — LISINOPRIL 30 MG PO TABS: 15.0000 mg | ORAL_TABLET | Freq: Every day | ORAL | 3 refills | Status: DC

## 2019-02-08 NOTE — Patient Instructions (Signed)
Medication Instructions:  1- Losartan removed from list 2- START Lisinopril Take 0.5 tablets (15 mg total) by mouth daily  *If you need a refill on your cardiac medications before your next appointment, please call your pharmacy*  Lab Work: None ordered  If you have labs (blood work) drawn today and your tests are completely normal, you will receive your results only by: Marland Kitchen MyChart Message (if you have MyChart) OR . A paper copy in the mail If you have any lab test that is abnormal or we need to change your treatment, we will call you to review the results.  Testing/Procedures: 1- A zio monitor was placed today. It will remain on for 14 days. You will then return monitor and event diary in provided box. It takes 1-2 weeks for report to be downloaded and returned to [place]. We will call you with the results. If monitor falls of or has orange flashing light, please call Zio for further instructions.     Follow-Up: At [facility], you and your health needs are our priority.  As part of our continuing mission to provide you with exceptional heart care, we have created designated Provider Care Teams.  These Care Teams include your primary Cardiologist (physician) and Advanced Practice Providers (APPs -  Physician Assistants and Nurse Practitioners) who all work together to provide you with the care you need, when you need it.  Your next appointment:   6 week(s)  The format for your next appointment:   In Person  Provider:   Please establish primary cardiologist

## 2019-02-09 ENCOUNTER — Encounter: Payer: Self-pay | Admitting: Family Medicine

## 2019-02-09 ENCOUNTER — Ambulatory Visit: Payer: Managed Care, Other (non HMO) | Admitting: Family Medicine

## 2019-02-09 ENCOUNTER — Telehealth (INDEPENDENT_AMBULATORY_CARE_PROVIDER_SITE_OTHER): Payer: Managed Care, Other (non HMO) | Admitting: Family Medicine

## 2019-02-09 VITALS — BP 139/83 | HR 74 | Temp 97.7°F

## 2019-02-09 DIAGNOSIS — M25521 Pain in right elbow: Secondary | ICD-10-CM

## 2019-02-09 DIAGNOSIS — I129 Hypertensive chronic kidney disease with stage 1 through stage 4 chronic kidney disease, or unspecified chronic kidney disease: Secondary | ICD-10-CM

## 2019-02-09 DIAGNOSIS — R143 Flatulence: Secondary | ICD-10-CM | POA: Diagnosis not present

## 2019-02-09 MED ORDER — SIMETHICONE 125 MG PO TABS: 1.0000 | ORAL_TABLET | Freq: Four times a day (QID) | ORAL | 3 refills | Status: DC | PRN

## 2019-02-09 NOTE — Progress Notes (Signed)
Lvm to make 3 month f/u,sent letter

## 2019-02-09 NOTE — Patient Instructions (Signed)
Elbow and Forearm Exercises Ask your health care provider which exercises are safe for you. Do exercises exactly as told by your health care provider and adjust them as directed. It is normal to feel mild stretching, pulling, tightness, or discomfort as you do these exercises. Stop right away if you feel sudden pain or your pain gets worse. Do not begin these exercises until told by your health care provider. Range-of-motion exercises These exercises warm up your muscles and joints and improve the movement and flexibility of your injured elbow and forearm. The exercises also help to relieve pain, numbness, and tingling. These exercises are done using the muscles in your injured elbow and forearm (active). Elbow flexion, active 1. Hold your left / right arm at your side, and bend your elbow (flexion) as far as you can using only your arm muscles. 2. Hold this position for __________ seconds. 3. Slowly return to the starting position. Repeat __________ times. Complete this exercise __________ times a day. Elbow extension, active 1. Hold your left / right arm at your side, and straighten your elbow (extension) as much as you can using only your arm muscles. 2. Hold this position for __________ seconds. 3. Slowly return to the starting position. Repeat __________ times. Complete this exercise __________ times a day. Active forearm rotation, supination This is an exercise in which you turn (rotate) your forearm palm up (supination). 1. Stand or sit with your elbows at your sides. 2. Bend your left / right elbow to a 90-degree angle (right angle). 3. Rotate your palm up until you feel a gentle stretch on the inside of your forearm. 4. Hold this position for __________ seconds. 5. Slowly return to the starting position. Repeat __________ times. Complete this exercise __________ times a day. Active forearm rotation, pronation This is an exercise in which you turn (rotate) your forearm palm down  (pronation). 1. Stand or sit with your elbows at your sides. 2. Bend your left / right elbow to a 90-degree angle (right angle). 3. Rotate your palm down until you feel a gentle stretch on the top of your forearm. 4. Hold this position for __________ seconds. 5. Slowly return to the starting position. Repeat __________ times. Complete this exercise __________ times a day. Stretching exercises These exercises warm up your muscles and joints and improve the movement and flexibility of your injured elbow and forearm. These exercises also help to relieve pain, numbness, and tingling. These exercises are done using your healthy elbow and forearm to help stretch the muscles in your injured elbow and forearm (active-assisted). Elbow flexion, active-assisted  1. Hold your left / right arm at your side, and bend your elbow (flexion) as much as you can using your left / right arm muscles. 2. Use your other hand to bend your left / right elbow farther. To do this, gently push up on your forearm until you feel a gentle stretch on the back of your elbow. 3. Hold this position for __________ seconds. 4. Slowly return to the starting position. Repeat __________ times. Complete this exercise __________ times a day. Elbow extension, active-assisted  1. Hold your left / right arm at your side, and straighten your elbow (extension) as much as you can using your left / right arm muscles. 2. Use your other hand to straighten the left / right elbow farther. To do this, gently push down on your forearm until you feel a gentle stretch on the inside of your elbow. 3. Hold this position for __________  seconds. 4. Slowly return to the starting position. Repeat __________ times. Complete this exercise __________ times a day. Active-assisted forearm rotation, supination This is an exercise in which you turn (rotate) your forearm palm up (supination). 1. Sit with your left / right elbow bent in a 90-degree angle (right  angle) with your forearm resting on a table. 2. Keeping your upper body and shoulder still, rotate your forearm so your palm faces upward. 3. Use your other hand to help rotate your forearm further until you feel a gentle to moderate stretch. 4. Hold this position for __________ seconds. 5. Slowly release the stretch and return to the starting position. Repeat __________ times. Complete this exercise __________ times a day. Active-assisted forearm rotation, pronation This is an exercise in which you turn (rotate) your forearm palm down (pronation). 1. Sit with your left / right elbow bent in a 90-degree angle (right angle) with your forearm resting on a table. 2. Keeping your upper body and shoulder still, rotate your forearm so your palm faces the tabletop. 3. Use your other hand to help rotate your forearm further until you feel a gentle to moderate stretch. 4. Hold this position for __________ seconds. 5. Slowly release the stretch and return to the starting position. Repeat __________ times. Complete this exercise __________ times a day. Passive elbow flexion, supine 1. Lie on your back (supine position). 2. Extend your left / right arm up in the air, bracing it with your other hand. 3. Let your left / right hand slowly lower toward your shoulder (passive flexion), while your elbow stays pointed toward the ceiling. You should feel a gentle stretch along the back of your upper arm and elbow. 4. If instructed by your health care provider, you may increase the intensity of your stretch by adding a small wrist weight or hand weight. 5. Hold this position for __________ seconds. 6. Slowly return to the starting position. Repeat __________ times. Complete this exercise __________ times a day. Passive elbow extension, supine  1. Lie on your back (supine position). Make sure that you are in a comfortable position that lets you relax your arm muscles. 2. Place a folded towel under your left /  right upper arm so your elbow and shoulder are at the same height. Straighten your left / right arm so your elbow does not rest on the bed or towel. 3. Let the weight of your hand stretch your elbow (passive extension). Keep your arm and chest muscles relaxed. You should feel a stretch on the inside of your elbow. 4. If told by your health care provider, you may increase the intensity of your stretch by adding a small wrist weight or hand weight. 5. Hold this position for __________ seconds. 6. Slowly release the stretch. Repeat __________ times. Complete this exercise __________ times a day. Strengthening exercises These exercises build strength and endurance in your elbow and forearm. Endurance is the ability to use your muscles for a long time, even after they get tired. Elbow flexion, isometric  1. Stand or sit up straight. 2. Bend your left / right elbow in a 90-degree angle (right angle), and keep your forearm at the height of your waist. Your thumb should be pointed toward the ceiling (neutral forearm). 3. Place your other hand on top of your left / right forearm. Gently push down while you resist with your left / right arm (isometric flexion). Push as hard as you can with both arms without causing any pain or movement   at your left / right elbow. 4. Hold this position for __________ seconds. 5. Slowly release the tension in both arms. Let your muscles relax completely before you repeat the exercise. Repeat __________ times. Complete this exercise __________ times a day. Elbow extension, isometric  1. Stand or sit up straight. 2. Place your left / right arm so your palm faces your abdomen and is at the height of your waist. 3. Place your other hand on the underside of your left / right forearm. Gently push up while you resist with your left / right arm (isometric extension). Push as hard as you can with both arms without causing any pain or movement at your left / right elbow. 4. Hold this  position for __________ seconds. 5. Slowly release the tension in both arms. Let your muscles relax completely before you repeat the exercise. Repeat __________ times. Complete this exercise __________ times a day. Elbow flexion with forearm palm up  1. Sit on a firm chair without armrests, or stand up. 2. Place your left / right arm at your side with your elbow straight and your palm facing forward. 3. Holding a __________weight or gripping a rubber exercise band or tubing, bend your elbow to bring your hand toward your shoulder (flexion). 4. Hold this position for __________ seconds. 5. Slowly return to the starting position. Repeat __________ times. Complete this exercise __________ times a day. Elbow extension, active  1. Sit on a firm chair without armrests, or stand up. 2. Hold a rubber exercise band or tubing in both hands. 3. Keeping your upper arms at your sides, bring both hands up to your left / right shoulder. Keep your left / right hand just below your other hand. 4. Straighten your left / right elbow (extension) while keeping your other arm still. 5. Hold this position for __________ seconds. 6. Control the resistance of the band or tubing as you return to the starting position. Repeat __________ times. Complete this exercise __________ times a day. Forearm rotation, supination  1. Sit with your left / right forearm supported on a table. Your elbow should be at waist height and bent at a 90-degree angle (right angle). 2. Gently grasp a lightweight hammer. 3. Rest your hand over the edge of the table with your palm facing down. 4. Without moving your left / right elbow, slowly rotate your forearm to turn your palm up toward the ceiling (supination). 5. Hold this position for __________ seconds. 6. Slowly return to the starting position. Repeat __________ times. Complete this exercise __________ times a day. Forearm rotation, pronation  1. Sit with your left / right forearm  supported on a table. Keep your elbow below shoulder height. 2. Gently grasp a lightweight hammer. 3. Rest your hand over the edge of the table with your palm facing up. 4. Without moving your left / right elbow, slowly rotate your forearm to turn your palm down toward the floor (pronation). 5. Hold this position for __________seconds. 6. Slowly return to the starting position. Repeat __________ times. Complete this exercise __________ times a day. This information is not intended to replace advice given to you by your health care provider. Make sure you discuss any questions you have with your health care provider. Document Revised: 04/14/2018 Document Reviewed: 01/12/2018 Elsevier Patient Education  [place].  Elbow Bursitis Bursitis is swelling and pain at the tip of your elbow. This happens when fluid builds up in a sac under your skin (bursa). This may also  be called olecranon bursitis. Follow these instructions at home: Medicines  Take over-the-counter and prescription medicines only as told by your doctor.  If you were prescribed an antibiotic, take it exactly as told by your doctor. Do not stop taking it even if you start to feel better. Managing pain, stiffness, and swelling   If told, put ice on your elbow: ? Put ice in a plastic bag. ? Place a towel between your skin and the bag. ? Leave the ice on for 20 minutes, 2-3 times a day.  If your bursitis is caused by an injury, follow instructions from your doctor about: ? Resting your elbow. ? Wearing a bandage.  Wear elbow pads or elbow wraps as needed. These help cushion your elbow. General instructions  Avoid any activities that cause elbow pain. Ask your doctor what activities are safe for you.  Keep all follow-up visits as told by your doctor. This is important. Contact a doctor if you have:  A fever.  Problems that do not get better with treatment.  Pain or swelling that: ? Gets worse. ? Goes away  and then comes back.  Pus draining from your elbow. Get help right away if you have:  Trouble moving your arm, hand, or fingers. Summary  Bursitis is swelling and pain at the tip of the elbow.  You may need to take medicine or put ice on your elbow.  Contact your doctor if your problems do not get better with treatment. This information is not intended to replace advice given to you by your health care provider. Make sure you discuss any questions you have with your health care provider. Document Revised: 12/04/2016 Document Reviewed: 12/01/2016 Elsevier Patient Education  2020 [employer].

## 2019-02-09 NOTE — Progress Notes (Signed)
BP 139/83   Pulse 74   Temp 97.7 F (36.5 C)    Subjective:    Patient ID: Arthur [name], male    DOB: Jan 08, 1965, 55 y.o.   MRN: [id number]  HPI: Arthur [name] is a 54 y.o. male presenting on 02/09/2019 for Gastroesophageal Reflux and Hypertension (Patient would like to discuss lisinopril side effects)  Right after his colonoscopy he started with some severe gas for about a week +. He started some omeprazole, but that didn't seem like it was helping. He started moving around a bit more and that has gone away over the past couple of days.   HYPERTENSION Hypertension status: controlled  Satisfied with current treatment? yes Duration of hypertension: chronic BP monitoring frequency:  a few times a month BP medication side effects:  no Medication compliance: excellent compliance Previous BP meds:lisinopril, Losartan Aspirin: yes Recurrent headaches: no Visual changes: no Palpitations: no Dyspnea: no Chest pain: no Lower extremity edema: no Dizzy/lightheaded: no  Has been having pain in his elbow- has been feeling a bit better since seeing Malachy Mood, but continues to have some aching in his elbow. He would like to do something to try to get it to feel better. No other concerns or complaints at this time. Continue to monitor.   Relevant past medical, surgical, family and social history reviewed and updated as indicated. Interim medical history since our last visit reviewed. Allergies and medications reviewed and updated.  Current Outpatient Medications on File Prior to Visit  Medication Sig  . aspirin EC 81 MG EC tablet Take 1 tablet (81 mg total) by mouth daily.  Marland Kitchen atorvastatin (LIPITOR) 40 MG tablet Take 2 tablets (80 mg total) by mouth daily.  . empagliflozin (JARDIANCE) 25 MG TABS tablet Take 25 mg by mouth daily.  . fluticasone (FLONASE) 50 MCG/ACT nasal spray Place 2 sprays into both nostrils daily.  Marland Kitchen glucose blood test strip 1 each by Other route as needed for other.  One touch ultra test strips. Use as instructed.  . Insulin Degludec-Liraglutide (XULTOPHY) 100-3.6 UNIT-MG/ML SOPN Inject 50 Units into the skin at bedtime.  . Insulin Pen Needle (NOVOFINE PLUS) 32G X 4 MM MISC Use one needle daily with insulin  . lansoprazole (PREVACID) 30 MG capsule Take 30 mg by mouth daily at 12 noon.  Marland Kitchen lisinopril (ZESTRIL) 30 MG tablet Take 0.5 tablets (15 mg total) by mouth daily.  . metFORMIN (GLUCOPHAGE) 1000 MG tablet TAKE 1 TABLET BY MOUTH TWICE DAILY  . methocarbamol (ROBAXIN) 500 MG tablet Take by mouth 2 (two) times daily.   . sildenafil (VIAGRA) 100 MG tablet Take 0.5-1 tablets (50-100 mg total) by mouth daily as needed for erectile dysfunction.   No current facility-administered medications on file prior to visit.    Review of Systems  Constitutional: Negative.   Respiratory: Negative.   Musculoskeletal: Positive for arthralgias. Negative for back pain, gait problem, joint swelling, myalgias, neck pain and neck stiffness.  Skin: Negative.   Neurological: Negative.   Psychiatric/Behavioral: Negative.     Per HPI unless specifically indicated above     Objective:    BP 139/83   Pulse 74   Temp 97.7 F (36.5 C)   Wt Readings from Last 3 Encounters:  02/08/19 154 lb (69.9 kg)  01/29/19 153 lb 12.8 oz (69.8 kg)  01/25/19 149 lb 14.6 oz (68 kg)    Physical Exam Vitals and nursing note reviewed.  Constitutional:      General: He is  not in acute distress.    Appearance: Normal appearance. He is not ill-appearing, toxic-appearing or diaphoretic.  HENT:     Head: Normocephalic and atraumatic.     Right Ear: External ear normal.     Left Ear: External ear normal.     Nose: Nose normal.     Mouth/Throat:     Mouth: Mucous membranes are moist.     Pharynx: Oropharynx is clear.  Eyes:     General: No scleral icterus.       Right eye: No discharge.        Left eye: No discharge.     Conjunctiva/sclera: Conjunctivae normal.     Pupils: Pupils are  equal, round, and reactive to light.  Pulmonary:     Effort: Pulmonary effort is normal. No respiratory distress.     Comments: Speaking in full sentences Musculoskeletal:        General: Normal range of motion.     Cervical back: Normal range of motion.  Skin:    Coloration: Skin is not jaundiced or pale.     Findings: No bruising, erythema, lesion or rash.  Neurological:     Mental Status: He is alert and oriented to person, place, and time. Mental status is at baseline.  Psychiatric:        Mood and Affect: Mood normal.        Behavior: Behavior normal.        Thought Content: Thought content normal.        Judgment: Judgment normal.     Results for orders placed or performed during the hospital encounter of 02/02/19  NM Myocar Multi W/Spect W/Wall Motion / EF  Result Value Ref Range   Rest HR 74 bpm   Rest BP 156/88 mmHg   Exercise duration (sec) 0 sec   Percent HR 82 %   Exercise duration (min) 0 min   Estimated workload 1.0 METS   Peak HR 136 bpm   Peak BP 156/87 mmHg   MPHR 165 bpm   SSS 2    SRS 0    SDS 0    TID 0.95    LV sys vol 23 mL   LV dias vol 57 62 - 150 mL      Assessment & Plan:   Problem List Items Addressed This Visit      Genitourinary   Benign hypertensive renal disease    Doing well on recheck. Back on his lisinopril. Continue to monitor. Call with any concerns. Continue to monitor.        Other Visit Diagnoses    Flatulence    -  Primary   Will start simethicone and continue exercise. Call with any concerns. Conitnue to monitor.    Right elbow pain       Will start exercises. If not better by next visit- will give injection.        Follow up plan: Return in about 3 months (around 05/09/2019).   . This visit was completed via mychart due to the restrictions of the COVID-19 pandemic. All issues as above were discussed and addressed. Physical exam was done as above through visual confirmation on mychart. If it was felt that the patient  should be evaluated in the office, they were directed there. The patient verbally consented to this visit. . Location of the patient: home . Location of the provider: home . Those involved with this call:  . Provider: Park Liter, DO . CMA: Tiffany Reel,  CMA . Front Desk/Registration: Don Perking  . Time spent on call: 25 minutes with patient face to face via video conference. More than 50% of this time was spent in counseling and coordination of care. 40 minutes total spent in review of patient's record and preparation of their chart.

## 2019-02-09 NOTE — Assessment & Plan Note (Signed)
Doing well on recheck. Back on his lisinopril. Continue to monitor. Call with any concerns. Continue to monitor.

## 2019-02-15 ENCOUNTER — Telehealth: Payer: Managed Care, Other (non HMO) | Admitting: Family Medicine

## 2019-02-27 ENCOUNTER — Telehealth: Payer: Self-pay | Admitting: Cardiology

## 2019-02-27 NOTE — Telephone Encounter (Signed)
Spoke with patient and advised no results at this time, will call once received and reviewed.

## 2019-02-27 NOTE — Telephone Encounter (Signed)
Patient wants to discuss zio results and if upcoming appt is actually needed.

## 2019-03-09 ENCOUNTER — Telehealth: Payer: Self-pay

## 2019-03-09 NOTE — Telephone Encounter (Signed)
Call to patient to discuss results of heart monitor.  Pt verbalized understanding and had no further questions at this time.   No further orders.   Advised pt to call for any further questions or concerns.

## 2019-03-09 NOTE — Telephone Encounter (Signed)
-----   Message from Rise Mu, PA-C sent at 03/01/2019 11:25 AM EST ----- Outpatient cardiac monitoring showed a predominant rhythm of sinus with an average heart rate of 100 bpm (range 61 to 173 bpm.  No significant arrhythmias were noted.  Patient triggered events were associated with sinus tachycardia with rates in the 130s bpm.  Rare PACs and PVCs.  Overall reassuring cardiac monitor.  No further work-up needed at this time.

## 2019-03-15 ENCOUNTER — Telehealth: Payer: Self-pay

## 2019-03-15 NOTE — Telephone Encounter (Signed)
Prior Authorization initiated via CoverMyMeds for Xultophy 100/3.6 Units-mg/m Key: Helane Gunther

## 2019-03-16 NOTE — Telephone Encounter (Signed)
PA was already previously approved 01/31/2019 through 01/31/2020.

## 2019-03-24 ENCOUNTER — Other Ambulatory Visit: Payer: Self-pay

## 2019-03-24 ENCOUNTER — Ambulatory Visit (INDEPENDENT_AMBULATORY_CARE_PROVIDER_SITE_OTHER): Payer: Managed Care, Other (non HMO) | Admitting: Cardiology

## 2019-03-24 ENCOUNTER — Encounter: Payer: Self-pay | Admitting: Cardiology

## 2019-03-24 VITALS — BP 114/78 | HR 87 | Ht 67.0 in | Wt 152.0 lb

## 2019-03-24 DIAGNOSIS — E7800 Pure hypercholesterolemia, unspecified: Secondary | ICD-10-CM | POA: Diagnosis not present

## 2019-03-24 DIAGNOSIS — I10 Essential (primary) hypertension: Secondary | ICD-10-CM

## 2019-03-24 DIAGNOSIS — R002 Palpitations: Secondary | ICD-10-CM | POA: Diagnosis not present

## 2019-03-24 NOTE — Patient Instructions (Signed)
Medication Instructions:  Your physician recommends that you continue on your current medications as directed. Please refer to the Current Medication list given to you today.  *If you need a refill on your cardiac medications before your next appointment, please call your pharmacy*   Lab Work: None ordered If you have labs (blood work) drawn today and your tests are completely normal, you will receive your results only by: . MyChart Message (if you have MyChart) OR . A paper copy in the mail If you have any lab test that is abnormal or we need to change your treatment, we will call you to review the results.   Testing/Procedures: None ordered   Follow-Up: At [facility], you and your health needs are our priority.  As part of our continuing mission to provide you with exceptional heart care, we have created designated Provider Care Teams.  These Care Teams include your primary Cardiologist (physician) and Advanced Practice Providers (APPs -  Physician Assistants and Nurse Practitioners) who all work together to provide you with the care you need, when you need it.  We recommend signing up for the patient portal called "MyChart".  Sign up information is provided on this After Visit Summary.  MyChart is used to connect with patients for Virtual Visits (Telemedicine).  Patients are able to view lab/test results, encounter notes, upcoming appointments, etc.  Non-urgent messages can be sent to your provider as well.   To learn more about what you can do with MyChart, go to [email or web address].    Your next appointment:   As needed   The format for your next appointment:   In Person  Provider:    You may see Brian Agbor-Etang, MD   or one of the following Advanced Practice Providers on your designated Care Team:    Christopher Berge, NP  Ryan Dunn, PA-C  Jacquelyn Visser, PA-C    Other Instructions N/A  

## 2019-03-24 NOTE — Progress Notes (Signed)
Cardiology Office Note:    Date:  03/24/2019   ID:  Arthur [name], DOB Jun 21, 1965, MRN [id number]  PCP:  Valerie Roys, DO  Cardiologist:  Kate Sable, MD  Electrophysiologist:  None   Referring MD: Valerie Roys, DO   Chief Complaint  Patient presents with  . office visit    Pt ZIO f/u. Pt has no complaints. Meds verbally reviewed w/ pt.    History of Present Illness:    Arthur [name] is a 54 y.o. male with a hx of hypertension, hyperlipidemia, diabetes who presents for follow-up.  He was admitted to the hospital 1/24 through 1/25 with chest pain and palpitations in the setting of hypoglycemia with blood sugar of 70.  By the time EMS arrived he was feeling better after eating.  Initial high-sensitivity troponin 15 with a delta of 44 peaking at 45 and subsequently downtrending.  D-dimer elevated at 623.37.  CTA chest was negative for PE with noted aortic atherosclerosis.  He was treated with heparin infusion.  EKG showed sinus tachycardia with no acute ST-T wave changes.  Echo showed an EF of 60 to 65%, mild LVH, grade 1 diastolic dysfunction, no regional wall motion abnormalities, normal RVSF and cavity size, unable to estimate PASP.  Myocardial perfusion imaging stress test on 02/03/2019 did not show any evidence for ischemia.  He had symptoms of palpitations post hospital discharge.  Cardiac monitor was placed.  He now presents for results.  Past Medical History:  Diagnosis Date  . Diabetes mellitus with renal complications ([place])   . Erectile dysfunction associated with type 2 diabetes mellitus ([place])   . Exotropia   . History of tobacco abuse   . Hyperlipidemia   . Hypertension   . Thoracic back pain     Past Surgical History:  Procedure Laterality Date  . COLONOSCOPY WITH PROPOFOL N/A 01/25/2019   Procedure: COLONOSCOPY WITH PROPOFOL;  Surgeon: Lin Landsman, MD;  Location: [facility] ENDOSCOPY;  Service: Gastroenterology;  Laterality: N/A;  Positive on  12/22/2018  . HIP SURGERY    . SHOULDER SURGERY Left    bone spurs removed    Current Medications: Current Meds  Medication Sig  . aspirin EC 81 MG EC tablet Take 1 tablet (81 mg total) by mouth daily.  Marland Kitchen atorvastatin (LIPITOR) 40 MG tablet Take 2 tablets (80 mg total) by mouth daily.  . empagliflozin (JARDIANCE) 25 MG TABS tablet Take 25 mg by mouth daily.  . fluticasone (FLONASE) 50 MCG/ACT nasal spray Place 2 sprays into both nostrils daily.  Marland Kitchen glucose blood test strip 1 each by Other route as needed for other. One touch ultra test strips. Use as instructed.  . Insulin Degludec-Liraglutide (XULTOPHY) 100-3.6 UNIT-MG/ML SOPN Inject 50 Units into the skin at bedtime.  . Insulin Pen Needle (NOVOFINE PLUS) 32G X 4 MM MISC Use one needle daily with insulin  . lansoprazole (PREVACID) 30 MG capsule Take 30 mg by mouth daily at 12 noon.  Marland Kitchen lisinopril (ZESTRIL) 30 MG tablet Take 0.5 tablets (15 mg total) by mouth daily.  . metFORMIN (GLUCOPHAGE) 1000 MG tablet TAKE 1 TABLET BY MOUTH TWICE DAILY  . methocarbamol (ROBAXIN) 500 MG tablet Take by mouth 2 (two) times daily.   . sildenafil (VIAGRA) 100 MG tablet Take 0.5-1 tablets (50-100 mg total) by mouth daily as needed for erectile dysfunction.     Allergies:   Codeine, Hydrocodone, and Oxycodone   Social History   Socioeconomic History  . Marital status:  Married    Spouse name: Not on file  . Number of children: Not on file  . Years of education: Not on file  . Highest education level: Not on file  Occupational History  . Not on file  Tobacco Use  . Smoking status: Former Smoker    Packs/day: 1.00    Years: 20.00    Pack years: 20.00    Types: Cigarettes    Quit date: 01/05/1986    Years since quitting: 33.2  . Smokeless tobacco: Never Used  Substance and Sexual Activity  . Alcohol use: No  . Drug use: No  . Sexual activity: Yes  Other Topics Concern  . Not on file  Social History Narrative  . Not on file   Social  Determinants of Health   Financial Resource Strain:   . Difficulty of Paying Living Expenses:   Food Insecurity:   . Worried About Charity fundraiser in the Last Year:   . Arboriculturist in the Last Year:   Transportation Needs:   . Film/video editor (Medical):   Marland Kitchen Lack of Transportation (Non-Medical):   Physical Activity:   . Days of Exercise per Week:   . Minutes of Exercise per Session:   Stress:   . Feeling of Stress :   Social Connections:   . Frequency of Communication with Friends and Family:   . Frequency of Social Gatherings with Friends and Family:   . Attends Religious Services:   . Active Member of Clubs or Organizations:   . Attends Archivist Meetings:   Marland Kitchen Marital Status:      Family History: The patient's family history includes Diabetes in his father, maternal aunt, and sister; Lung disease in his mother.  ROS:   Please see the history of present illness.     All other systems reviewed and are negative.  EKGs/Labs/Other Studies Reviewed:    The following studies were reviewed today:   EKG:  EKG is  ordered today.  The ekg ordered today demonstrates normal sinus rhythm, normal ECG.  Recent Labs: 01/29/2019: ALT 22; BUN 13; Creatinine, Ser 1.19; Potassium 3.4; Sodium 142 01/30/2019: Hemoglobin 12.2; Platelets 281; TSH 1.456  Recent Lipid Panel    Component Value Date/Time   CHOL 129 12/20/2018 1531   TRIG 204 (H) 12/20/2018 1531   HDL 36 (L) 12/20/2018 1531   LDLCALC 59 12/20/2018 1531    Physical Exam:    VS:  BP 114/78 (BP Location: Left Arm, Patient Position: Sitting, Cuff Size: Normal)   Pulse 87   Ht 5\' 7"  (1.702 m)   Wt 152 lb (68.9 kg)   SpO2 98%   BMI 23.81 kg/m     Wt Readings from Last 3 Encounters:  03/24/19 152 lb (68.9 kg)  02/08/19 154 lb (69.9 kg)  01/29/19 153 lb 12.8 oz (69.8 kg)     GEN:  Well nourished, well developed in no acute distress HEENT: Normal NECK: No JVD; No carotid bruits LYMPHATICS: No  lymphadenopathy CARDIAC: RRR, no murmurs, rubs, gallops RESPIRATORY:  Clear to auscultation without rales, wheezing or rhonchi  ABDOMEN: Soft, non-tender, non-distended MUSCULOSKELETAL:  No edema; No deformity  SKIN: Warm and dry NEUROLOGIC:  Alert and oriented x 3 PSYCHIATRIC:  Normal affect   ASSESSMENT:    1. Palpitations   2. Essential hypertension   3. Pure hypercholesterolemia    PLAN:    In order of problems listed above:  1. Patient with history of  palpitations.  2-week cardiac monitor with no evidence for significant arrhythmias.  Patient triggered events were associated with sinus tachycardia.  Overall benign cardiac monitor.  Patient reassured. 2. History of hypertension, blood pressure well controlled.  Continue current BP meds. 3. History of hyperlipidemia.  Continue statin as prescribed.  Follow-up as needed.   Medication Adjustments/Labs and Tests Ordered: Current medicines are reviewed at length with the patient today.  Concerns regarding medicines are outlined above.  Orders Placed This Encounter  Procedures  . EKG 12-Lead   No orders of the defined types were placed in this encounter.   Patient Instructions  Medication Instructions:  Your physician recommends that you continue on your current medications as directed. Please refer to the Current Medication list given to you today.  *If you need a refill on your cardiac medications before your next appointment, please call your pharmacy*   Lab Work: None ordered If you have labs (blood work) drawn today and your tests are completely normal, you will receive your results only by: Marland Kitchen MyChart Message (if you have MyChart) OR . A paper copy in the mail If you have any lab test that is abnormal or we need to change your treatment, we will call you to review the results.   Testing/Procedures: None ordered   Follow-Up: At [facility], you and your health needs are our priority.  As part of our  continuing mission to provide you with exceptional heart care, we have created designated Provider Care Teams.  These Care Teams include your primary Cardiologist (physician) and Advanced Practice Providers (APPs -  Physician Assistants and Nurse Practitioners) who all work together to provide you with the care you need, when you need it.  We recommend signing up for the patient portal called "MyChart".  Sign up information is provided on this After Visit Summary.  MyChart is used to connect with patients for Virtual Visits (Telemedicine).  Patients are able to view lab/test results, encounter notes, upcoming appointments, etc.  Non-urgent messages can be sent to your provider as well.   To learn more about what you can do with MyChart, go to [email or web address].    Your next appointment:   As needed  The format for your next appointment:   In Person  Provider:    You may see Kate Sable, MD or one of the following Advanced Practice Providers on your designated Care Team:    Murray Hodgkins, NP  Christell Faith, PA-C  Marrianne Mood, PA-C    Other Instructions N/A     Signed, Kate Sable, MD  03/24/2019 4:32 PM    [place]

## 2019-05-16 ENCOUNTER — Ambulatory Visit: Payer: Managed Care, Other (non HMO) | Admitting: Family Medicine

## 2019-06-13 ENCOUNTER — Telehealth: Payer: Self-pay | Admitting: Cardiology

## 2019-06-13 NOTE — Telephone Encounter (Signed)
Lilia Pro with [employer] attorney's office called to follow up in regards to status of request for medical records. She states their office sent a request for records in March, 2021. However, she has not yet heard anything back from our office. Called Lorriane Shire with medial records and she made me aware that the call needed to be forwarded to [facility] because Lilia Pro was calling with an attorney's office. Call transferred to [facility].

## 2019-06-16 ENCOUNTER — Other Ambulatory Visit: Payer: Self-pay | Admitting: Family Medicine

## 2019-06-16 NOTE — Telephone Encounter (Signed)
Sent message to make an appointment.

## 2019-07-05 ENCOUNTER — Ambulatory Visit: Payer: Managed Care, Other (non HMO) | Attending: Internal Medicine

## 2019-07-05 DIAGNOSIS — Z20822 Contact with and (suspected) exposure to covid-19: Secondary | ICD-10-CM

## 2019-07-06 LAB — SARS-COV-2, NAA 2 DAY TAT

## 2019-07-06 LAB — NOVEL CORONAVIRUS, NAA: SARS-CoV-2, NAA: NOT DETECTED

## 2019-07-31 ENCOUNTER — Other Ambulatory Visit: Payer: Self-pay | Admitting: Family Medicine

## 2019-07-31 NOTE — Telephone Encounter (Signed)
Requested medication (s) are due for refill today: yes  Requested medication (s) are on the active medication list: yes  Last refill:  07/04/2019  Future visit scheduled: no  Notes to clinic: patient still has not schedule follow up Courtesy refill was already given  Requested Prescriptions  Pending Prescriptions Disp Refills   metFORMIN (GLUCOPHAGE) 1000 MG tablet [Pharmacy Med Name: METFORMIN '1000MG'$  TABLETS] 60 tablet 0    Sig: TAKE 1 TABLET BY MOUTH TWICE DAILY      Endocrinology:  Diabetes - Biguanides Failed - 07/31/2019  2:28 PM      Failed - HBA1C is between 0 and 7.9 and within 180 days    Hemoglobin A1C  Date Value Ref Range Status  08/10/2016 9.7  Final   HB A1C (BAYER DCA - WAIVED)  Date Value Ref Range Status  12/20/2018 7.3 (H) <7.0 % Final    Comment:                                          Diabetic Adult            <7.0                                       Healthy Adult        4.3 - 5.7                                                           (DCCT/NGSP) American Diabetes Association's Summary of Glycemic Recommendations for Adults with Diabetes: Hemoglobin A1c <7.0%. More stringent glycemic goals (A1c <6.0%) may further reduce complications at the cost of increased risk of hypoglycemia.    Hgb A1c MFr Bld  Date Value Ref Range Status  01/29/2019 7.7 (H) 4.8 - 5.6 % Final    Comment:    (NOTE) Pre diabetes:          5.7%-6.4% Diabetes:              >6.4% Glycemic control for   <7.0% adults with diabetes           Passed - Cr in normal range and within 360 days    Creatinine, Ser  Date Value Ref Range Status  01/29/2019 1.19 0.61 - 1.24 mg/dL Final          Passed - eGFR in normal range and within 360 days    GFR calc Af Amer  Date Value Ref Range Status  01/29/2019 >60 >60 mL/min Final   GFR calc non Af Amer  Date Value Ref Range Status  01/29/2019 >60 >60 mL/min Final          Passed - Valid encounter within last 6 months    Recent  Outpatient Visits           5 months ago [place], [place], DO   6 months ago Benign hypertensive renal disease   [place], [place], [place]   6 months ago Benign hypertensive renal disease   [facility] Merrie Roof [place], [place]   7 months  ago Suspected COVID-19 virus infection   [facility] [place], [place], DO   7 months ago Lateral epicondylitis of left elbow   [facility] Kathrine Haddock, NP

## 2019-08-04 ENCOUNTER — Ambulatory Visit: Payer: Managed Care, Other (non HMO) | Admitting: Family Medicine

## 2019-08-04 NOTE — Telephone Encounter (Signed)
No showed appointment today. 

## 2019-08-04 NOTE — Addendum Note (Signed)
Addended by: Sandria Manly on: 08/04/2019 01:35 PM   Modules accepted: Orders

## 2019-08-04 NOTE — Telephone Encounter (Signed)
Insurance requires 90 day supply for coverage.

## 2019-09-15 ENCOUNTER — Other Ambulatory Visit: Payer: Self-pay | Admitting: Family Medicine

## 2019-09-23 ENCOUNTER — Other Ambulatory Visit: Payer: Self-pay | Admitting: Family Medicine

## 2019-09-23 DIAGNOSIS — IMO0002 Reserved for concepts with insufficient information to code with codable children: Secondary | ICD-10-CM

## 2019-09-23 NOTE — Telephone Encounter (Signed)
Requested medication (s) are due for refill today: yes  Requested medication (s) are on the active medication list: yes  Last refill:  12/06/18  Future visit scheduled: no  Notes to clinic:  overdue blood work   Requested Prescriptions  Pending Prescriptions Disp [place] 100-3.6 UNIT-MG/ML SOPN [Pharmacy Med Name: XULTOPHY 100/3.6 PEN INJ 3ML] 45 mL 1    Sig: ADMINISTER 50 UNITS UNDER THE SKIN AT BEDTIME      Endocrinology:  Diabetes - Insulin + GLP-1 Receptor Agonist Combos 1 Failed - 09/23/2019  9:18 AM      Failed - HBA1C is between 0 and 7.9 and within 180 days    Hemoglobin A1C  Date Value Ref Range Status  08/10/2016 9.7  Final   HB A1C (BAYER DCA - WAIVED)  Date Value Ref Range Status  12/20/2018 7.3 (H) <7.0 % Final    Comment:                                          Diabetic Adult            <7.0                                       Healthy Adult        4.3 - 5.7                                                           (DCCT/NGSP) American Diabetes Association's Summary of Glycemic Recommendations for Adults with Diabetes: Hemoglobin A1c <7.0%. More stringent glycemic goals (A1c <6.0%) may further reduce complications at the cost of increased risk of hypoglycemia.    Hgb A1c MFr Bld  Date Value Ref Range Status  01/29/2019 7.7 (H) 4.8 - 5.6 % Final    Comment:    (NOTE) Pre diabetes:          5.7%-6.4% Diabetes:              >6.4% Glycemic control for   <7.0% adults with diabetes           Failed - Valid encounter within last 6 months    Recent Outpatient Visits           7 months ago [place], [place], DO   8 months ago Benign hypertensive renal disease   [facility] Merrie Roof [place], [place]   8 months ago Benign hypertensive renal disease   [facility] Merrie Roof [place], [place]   9 months ago Suspected COVID-19 virus infection   [facility] [place], DO   9 months ago Lateral epicondylitis of left elbow   [facility] Kathrine Haddock, NP

## 2019-09-25 ENCOUNTER — Other Ambulatory Visit: Payer: Self-pay | Admitting: Family Medicine

## 2019-09-25 DIAGNOSIS — IMO0002 Reserved for concepts with insufficient information to code with codable children: Secondary | ICD-10-CM

## 2019-09-25 NOTE — Telephone Encounter (Signed)
Patient has appointment 10/03/19

## 2019-09-25 NOTE — Telephone Encounter (Signed)
Routing to provider for refill.   Admin- please call and schedule f/up, patient is overdue.

## 2019-09-25 NOTE — Telephone Encounter (Signed)
Lvm to make this apt. 

## 2019-09-25 NOTE — Telephone Encounter (Signed)
Requested medication (s) are due for refill today: yes  Requested medication (s) are on the active medication list: yes  Last refill:  12/06/18  Future visit scheduled: yes  Notes to clinic:  overdue lab work- called pt and made appt.   Requested Prescriptions  Pending Prescriptions Disp [place] 100-3.6 UNIT-MG/ML SOPN [Pharmacy Med Name: XULTOPHY 100/3.6 PEN INJ 3ML] 45 mL 1    Sig: ADMINISTER 50 UNITS UNDER THE SKIN AT BEDTIME      Endocrinology:  Diabetes - Insulin + GLP-1 Receptor Agonist Combos 1 Failed - 09/25/2019  2:27 PM      Failed - HBA1C is between 0 and 7.9 and within 180 days    Hemoglobin A1C  Date Value Ref Range Status  08/10/2016 9.7  Final   HB A1C (BAYER DCA - WAIVED)  Date Value Ref Range Status  12/20/2018 7.3 (H) <7.0 % Final    Comment:                                          Diabetic Adult            <7.0                                       Healthy Adult        4.3 - 5.7                                                           (DCCT/NGSP) American Diabetes Association's Summary of Glycemic Recommendations for Adults with Diabetes: Hemoglobin A1c <7.0%. More stringent glycemic goals (A1c <6.0%) may further reduce complications at the cost of increased risk of hypoglycemia.    Hgb A1c MFr Bld  Date Value Ref Range Status  01/29/2019 7.7 (H) 4.8 - 5.6 % Final    Comment:    (NOTE) Pre diabetes:          5.7%-6.4% Diabetes:              >6.4% Glycemic control for   <7.0% adults with diabetes           Failed - Valid encounter within last 6 months    Recent Outpatient Visits           7 months ago [place], [place], DO   8 months ago Benign hypertensive renal disease   [facility] Merrie Roof [place], [place]   8 months ago Benign hypertensive renal disease   [facility] Merrie Roof [place], [place]   9 months ago Suspected COVID-19 virus infection   [facility] [place], [place], DO   9 months ago Lateral epicondylitis of left elbow   [facility] Kathrine Haddock, NP       Future Appointments             In 1 week Wynetta Emery, Barb Merino, DO [employer]

## 2019-10-03 ENCOUNTER — Encounter: Payer: Self-pay | Admitting: Family Medicine

## 2019-10-03 ENCOUNTER — Ambulatory Visit: Payer: 59 | Admitting: Family Medicine

## 2019-10-03 ENCOUNTER — Telehealth: Payer: Self-pay

## 2019-10-03 ENCOUNTER — Other Ambulatory Visit: Payer: Self-pay

## 2019-10-03 VITALS — BP 124/81 | HR 82 | Temp 98.1°F | Wt 164.0 lb

## 2019-10-03 DIAGNOSIS — N400 Enlarged prostate without lower urinary tract symptoms: Secondary | ICD-10-CM | POA: Diagnosis not present

## 2019-10-03 DIAGNOSIS — Z23 Encounter for immunization: Secondary | ICD-10-CM

## 2019-10-03 DIAGNOSIS — E785 Hyperlipidemia, unspecified: Secondary | ICD-10-CM

## 2019-10-03 DIAGNOSIS — E1165 Type 2 diabetes mellitus with hyperglycemia: Secondary | ICD-10-CM

## 2019-10-03 DIAGNOSIS — E1129 Type 2 diabetes mellitus with other diabetic kidney complication: Secondary | ICD-10-CM | POA: Diagnosis not present

## 2019-10-03 DIAGNOSIS — R000 Tachycardia, unspecified: Secondary | ICD-10-CM

## 2019-10-03 DIAGNOSIS — I129 Hypertensive chronic kidney disease with stage 1 through stage 4 chronic kidney disease, or unspecified chronic kidney disease: Secondary | ICD-10-CM | POA: Diagnosis not present

## 2019-10-03 DIAGNOSIS — R1013 Epigastric pain: Secondary | ICD-10-CM

## 2019-10-03 DIAGNOSIS — IMO0002 Reserved for concepts with insufficient information to code with codable children: Secondary | ICD-10-CM

## 2019-10-03 LAB — URINALYSIS, ROUTINE W REFLEX MICROSCOPIC
Bilirubin, UA: NEGATIVE
Glucose, UA: NEGATIVE
Ketones, UA: NEGATIVE
Leukocytes,UA: NEGATIVE
Nitrite, UA: NEGATIVE
Protein,UA: NEGATIVE
RBC, UA: NEGATIVE
Specific Gravity, UA: 1.015 (ref 1.005–1.030)
Urobilinogen, Ur: 0.2 mg/dL (ref 0.2–1.0)
pH, UA: 5 (ref 5.0–7.5)

## 2019-10-03 LAB — MICROALBUMIN, URINE WAIVED
Creatinine, Urine Waived: 100 mg/dL (ref 10–300)
Microalb, Ur Waived: 30 mg/L — ABNORMAL HIGH (ref 0–19)
Microalb/Creat Ratio: <30 mg/g (ref <30)

## 2019-10-03 LAB — BAYER DCA HB A1C WAIVED: HB A1C (BAYER DCA - WAIVED): 9.5 % — ABNORMAL HIGH (ref <7.0)

## 2019-10-03 MED ORDER — EMPAGLIFLOZIN 25 MG PO TABS
25.0000 mg | ORAL_TABLET | Freq: Every day | ORAL | 1 refills | Status: DC
Start: 2019-10-03 — End: 2019-12-13

## 2019-10-03 MED ORDER — ATORVASTATIN CALCIUM 40 MG PO TABS
80.0000 mg | ORAL_TABLET | Freq: Every day | ORAL | 1 refills | Status: DC
Start: 2019-10-03 — End: 2020-03-07

## 2019-10-03 MED ORDER — ASPIRIN 81 MG PO TBEC
81.0000 mg | DELAYED_RELEASE_TABLET | Freq: Every day | ORAL | 1 refills | Status: DC
Start: 2019-10-03 — End: 2020-03-07

## 2019-10-03 MED ORDER — LISINOPRIL 5 MG PO TABS
5.0000 mg | ORAL_TABLET | Freq: Every day | ORAL | 1 refills | Status: DC
Start: 2019-10-03 — End: 2020-05-06

## 2019-10-03 MED ORDER — GLUCAGON EMERGENCY 1 MG IJ KIT: 1.0000 mg | PACK | Freq: Every day | INTRAMUSCULAR | 12 refills | Status: DC | PRN

## 2019-10-03 MED ORDER — SILDENAFIL CITRATE 100 MG PO TABS: 50.0000 mg | ORAL_TABLET | Freq: Every day | ORAL | 11 refills | Status: DC | PRN

## 2019-10-03 MED ORDER — XULTOPHY 100-3.6 UNIT-MG/ML ~~LOC~~ SOPN: PEN_INJECTOR | SUBCUTANEOUS | 0 refills | Status: DC

## 2019-10-03 MED ORDER — METFORMIN HCL 1000 MG PO TABS
1000.0000 mg | ORAL_TABLET | Freq: Two times a day (BID) | ORAL | 1 refills | Status: DC
Start: 2019-10-03 — End: 2020-03-07

## 2019-10-03 NOTE — Progress Notes (Signed)
BP 124/81   Pulse 82   Temp 98.1 F (36.7 C) (Oral)   Wt 164 lb (74.4 kg)   SpO2 98%   BMI 25.69 kg/m    Subjective:    Patient ID: Arthur [name], male    DOB: 09-24-1965, 54 y.o.   MRN: [id number]  HPI: Arthur [name] is a 54 y.o. male  Chief Complaint  Patient presents with  . Diabetes  . Hypertension   HYPERTENSION / HYPERLIPIDEMIA- stopped his BP medicine, unsure why Satisfied with current treatment? yes Duration of hypertension: chronic BP monitoring frequency: not checking BP medication side effects: no Past BP meds: losartan Duration of hyperlipidemia: chronic Cholesterol medication side effects: no Cholesterol supplements: none Past cholesterol medications: atorvastatin Medication compliance: excellent compliance Aspirin: yes Recent stressors: no Recurrent headaches: no Visual changes: no Palpitations: no Dyspnea: no Chest pain: no Lower extremity edema: no Dizzy/lightheaded: no  DIABETES Hypoglycemic episodes:yes Polydipsia/polyuria: no Visual disturbance: no Chest pain: no Paresthesias: no Glucose Monitoring: yes  Accucheck frequency: TID  Fasting glucose: 136 Taking Insulin?: yes- 30 of xultophy Blood Pressure Monitoring: not checking Retinal Examination: Up to Date Foot Exam: Up to Date Diabetic Education: Completed Pneumovax: Up to Date Influenza: Up to Date Aspirin: yes  Relevant past medical, surgical, family and social history reviewed and updated as indicated. Interim medical history since our last visit reviewed. Allergies and medications reviewed and updated.  Review of Systems  Constitutional: Negative.   Respiratory: Negative.   Cardiovascular: Negative.   Gastrointestinal: Positive for abdominal distention and abdominal pain. Negative for anal bleeding, blood in stool, constipation, diarrhea, nausea, rectal pain and vomiting.       Gas and bloating, pepto bismol helps  Musculoskeletal: Negative.     Psychiatric/Behavioral: Negative.     Per HPI unless specifically indicated above     Objective:    BP 124/81   Pulse 82   Temp 98.1 F (36.7 C) (Oral)   Wt 164 lb (74.4 kg)   SpO2 98%   BMI 25.69 kg/m   Wt Readings from Last 3 Encounters:  10/03/19 164 lb (74.4 kg)  03/24/19 152 lb (68.9 kg)  02/08/19 154 lb (69.9 kg)    Physical Exam Vitals and nursing note reviewed.  Constitutional:      General: He is not in acute distress.    Appearance: Normal appearance. He is not ill-appearing, toxic-appearing or diaphoretic.  HENT:     Head: Normocephalic and atraumatic.     Right Ear: External ear normal.     Left Ear: External ear normal.     Nose: Nose normal.     Mouth/Throat:     Mouth: Mucous membranes are moist.     Pharynx: Oropharynx is clear.  Eyes:     General: No scleral icterus.       Right eye: No discharge.        Left eye: No discharge.     Extraocular Movements: Extraocular movements intact.     Conjunctiva/sclera: Conjunctivae normal.     Pupils: Pupils are equal, round, and reactive to light.  Cardiovascular:     Rate and Rhythm: Normal rate and regular rhythm.     Pulses: Normal pulses.     Heart sounds: Normal heart sounds. No murmur heard.  No friction rub. No gallop.   Pulmonary:     Effort: Pulmonary effort is normal. No respiratory distress.     Breath sounds: Normal breath sounds. No stridor. No wheezing, rhonchi or  rales.  Chest:     Chest wall: No tenderness.  Musculoskeletal:        General: Normal range of motion.     Cervical back: Normal range of motion and neck supple.  Skin:    General: Skin is warm and dry.     Capillary Refill: Capillary refill takes less than 2 seconds.     Coloration: Skin is not jaundiced or pale.     Findings: No bruising, erythema, lesion or rash.  Neurological:     General: No focal deficit present.     Mental Status: He is alert and oriented to person, place, and time. Mental status is at baseline.   Psychiatric:        Mood and Affect: Mood normal.        Behavior: Behavior normal.        Thought Content: Thought content normal.        Judgment: Judgment normal.     Results for orders placed or performed in visit on 10/03/19  Microalbumin, Urine Waived  Result Value Ref Range   Microalb, Ur Waived 30 (H) 0 - 19 mg/L   Creatinine, Urine Waived 100 10 - 300 mg/dL   Microalb/Creat Ratio <30 <30 mg/g  Urinalysis, Routine w reflex microscopic  Result Value Ref Range   Specific Gravity, UA 1.015 1.005 - 1.030   pH, UA 5.0 5.0 - 7.5   Color, UA Yellow Yellow   Appearance Ur Clear Clear   Leukocytes,UA Negative Negative   Protein,UA Negative Negative/Trace   Glucose, UA Negative Negative   Ketones, UA Negative Negative   RBC, UA Negative Negative   Bilirubin, UA Negative Negative   Urobilinogen, Ur 0.2 0.2 - 1.0 mg/dL   Nitrite, UA Negative Negative  Bayer DCA Hb A1c Waived  Result Value Ref Range   HB A1C (BAYER DCA - WAIVED) 9.5 (H) <7.0 %      Assessment & Plan:   Problem List Items Addressed This Visit      Cardiovascular and Mediastinum   Tachyarrhythmia    HR good today. Rechecking labs. Await results. Treat as needed.       Relevant Medications   sildenafil (VIAGRA) 100 MG tablet   atorvastatin (LIPITOR) 40 MG tablet   aspirin 81 MG EC tablet   lisinopril (ZESTRIL) 5 MG tablet   Other Relevant Orders   CBC with Differential/Platelet   Comprehensive metabolic panel   TSH     Endocrine   Type II diabetes mellitus with renal manifestations, uncontrolled (HCC)    A1c elevated at 9.5- concern for lows. Continue Xultophy 30units and recheck 3 months. If starting to creep up above 150 in AM will increase to 35 units. Continue to monitor. Refills given.       Relevant Medications   Insulin Degludec-Liraglutide (XULTOPHY) 100-3.6 UNIT-MG/ML SOPN   metFORMIN (GLUCOPHAGE) 1000 MG tablet   empagliflozin (JARDIANCE) 25 MG TABS tablet   atorvastatin (LIPITOR) 40  MG tablet   aspirin 81 MG EC tablet   lisinopril (ZESTRIL) 5 MG tablet   Glucagon, rDNA, (GLUCAGON EMERGENCY) 1 MG KIT   Other Relevant Orders   CBC with Differential/Platelet   Comprehensive metabolic panel   Microalbumin, Urine Waived (Completed)   Urinalysis, Routine w reflex microscopic (Completed)   Bayer DCA Hb A1c Waived (Completed)   Referral to Chronic Care Management Services     Genitourinary   Benign hypertensive renal disease - Primary    Doing well off medicine. Will  add '5mg'$  lisinopril for renal protection. Checking labs. Call if getting dizzy at all.      Relevant Orders   CBC with Differential/Platelet   Comprehensive metabolic panel   Microalbumin, Urine Waived (Completed)   Benign prostatic hyperplasia    Under good control on current regimen. Continue current regimen. Continue to monitor. Call with any concerns. Refills given. Labs drawn today.       Relevant Orders   CBC with Differential/Platelet   Comprehensive metabolic panel   PSA     Other   Dyslipidemia    Under good control on current regimen. Continue current regimen. Continue to monitor. Call with any concerns. Refills given. Labs drawn today.       Relevant Medications   atorvastatin (LIPITOR) 40 MG tablet   Other Relevant Orders   CBC with Differential/Platelet   Comprehensive metabolic panel   Lipid Panel w/o Chol/HDL Ratio    Other Visit Diagnoses    Epigastric pain       Will get him back into GI. Referral generated today. Call with any concerns.    Relevant Orders   Ambulatory referral to Gastroenterology   Flu vaccine need       Flu shot given today.   Relevant Orders   Flu Vaccine QUAD 36+ mos IM (Completed)       Follow up plan: Return in about 3 months (around 01/02/2020) for DM.

## 2019-10-03 NOTE — Assessment & Plan Note (Signed)
HR good today. Rechecking labs. Await results. Treat as needed.

## 2019-10-03 NOTE — Assessment & Plan Note (Signed)
Doing well off medicine. Will add 5mg  lisinopril for renal protection. Checking labs. Call if getting dizzy at all.

## 2019-10-03 NOTE — Assessment & Plan Note (Signed)
A1c elevated at 9.5- concern for lows. Continue Xultophy 30units and recheck 3 months. If starting to creep up above 150 in AM will increase to 35 units. Continue to monitor. Refills given.

## 2019-10-03 NOTE — Assessment & Plan Note (Signed)
Under good control on current regimen. Continue current regimen. Continue to monitor. Call with any concerns. Refills given. Labs drawn today.   

## 2019-10-03 NOTE — Chronic Care Management (AMB) (Signed)
  Care Management   Outreach Note  10/03/2019 Name: Arthur [name] MRN: [id number] DOB: [birth year]  Referred by: Valerie Roys, DO Reason for referral : Care Coordination (outreach to schedule referral with pharm d )   An unsuccessful telephone outreach was attempted today. The patient was referred to the case management team for assistance with care management and care coordination.   Follow Up Plan: A HIPAA compliant phone message was left for the patient providing contact information and requesting a return call.  The care management team will reach out to the patient again over the next 7 days.  If patient returns call to provider office, please advise to call [place]  at [phone]  [facility]

## 2019-10-04 ENCOUNTER — Telehealth: Payer: Self-pay

## 2019-10-04 LAB — COMPREHENSIVE METABOLIC PANEL
ALT: 22 IU/L (ref 0–44)
AST: 30 IU/L (ref 0–40)
Albumin/Globulin Ratio: 2.2 (ref 1.2–2.2)
Albumin: 4.3 g/dL (ref 3.8–4.9)
Alkaline Phosphatase: 95 IU/L (ref 44–121)
BUN/Creatinine Ratio: 9 (ref 9–20)
BUN: 11 mg/dL (ref 6–24)
Bilirubin Total: 0.3 mg/dL (ref 0.0–1.2)
CO2: 22 mmol/L (ref 20–29)
Calcium: 10 mg/dL (ref 8.7–10.2)
Chloride: 104 mmol/L (ref 96–106)
Creatinine, Ser: 1.23 mg/dL (ref 0.76–1.27)
GFR calc Af Amer: 75 mL/min/{1.73_m2} (ref >59)
GFR calc non Af Amer: 65 mL/min/{1.73_m2} (ref >59)
Globulin, Total: 2 g/dL (ref 1.5–4.5)
Glucose: 152 mg/dL — ABNORMAL HIGH (ref 65–99)
Potassium: 4.2 mmol/L (ref 3.5–5.2)
Sodium: 141 mmol/L (ref 134–144)
Total Protein: 6.3 g/dL (ref 6.0–8.5)

## 2019-10-04 LAB — CBC WITH DIFFERENTIAL/PLATELET
Basophils Absolute: 0 10*3/uL (ref 0.0–0.2)
Basos: 0 %
EOS (ABSOLUTE): 0.3 10*3/uL (ref 0.0–0.4)
Eos: 5 %
Hematocrit: 39.4 % (ref 37.5–51.0)
Hemoglobin: 13 g/dL (ref 13.0–17.7)
Immature Grans (Abs): 0 10*3/uL (ref 0.0–0.1)
Immature Granulocytes: 0 %
Lymphocytes Absolute: 2.7 10*3/uL (ref 0.7–3.1)
Lymphs: 40 %
MCH: 26.9 pg (ref 26.6–33.0)
MCHC: 33 g/dL (ref 31.5–35.7)
MCV: 81 fL (ref 79–97)
Monocytes Absolute: 0.6 10*3/uL (ref 0.1–0.9)
Monocytes: 9 %
Neutrophils Absolute: 3.1 10*3/uL (ref 1.4–7.0)
Neutrophils: 46 %
Platelets: 311 10*3/uL (ref 150–450)
RBC: 4.84 x10E6/uL (ref 4.14–5.80)
RDW: 13.4 % (ref 11.6–15.4)
WBC: 6.7 10*3/uL (ref 3.4–10.8)

## 2019-10-04 LAB — LIPID PANEL W/O CHOL/HDL RATIO
Cholesterol, Total: 131 mg/dL (ref 100–199)
HDL: 38 mg/dL — ABNORMAL LOW (ref >39)
LDL Chol Calc (NIH): 76 mg/dL (ref 0–99)
Triglycerides: 90 mg/dL (ref 0–149)
VLDL Cholesterol Cal: 17 mg/dL (ref 5–40)

## 2019-10-04 LAB — TSH: TSH: 4 u[IU]/mL (ref 0.450–4.500)

## 2019-10-04 LAB — PSA: Prostate Specific Ag, Serum: 1.2 ng/mL (ref 0.0–4.0)

## 2019-10-04 NOTE — Telephone Encounter (Signed)
PA for Xultophy initiated and submitted via Cover My Meds. Key: [id number]

## 2019-10-04 NOTE — Telephone Encounter (Signed)
PA approved.

## 2019-10-04 NOTE — Telephone Encounter (Signed)
PA for Sildenafil initiated and submitted via [place]. Key: Arthur [name]

## 2019-10-04 NOTE — Telephone Encounter (Signed)
sildenafil (VIAGRA) 100 MG tablet  PA needed for this as well

## 2019-10-05 NOTE — Telephone Encounter (Signed)
PA approved. Called and LVM letting patient know.

## 2019-10-06 NOTE — Chronic Care Management (AMB) (Signed)
  Care Management   Note  10/06/2019 Name: CARLISLE ENKE MRN: [id number] DOB: 10-Sep-1965  BARRE AYDELOTT is a 54 y.o. year old male who is a primary care patient of Valerie Roys, DO. I reached out to [name] by phone today in response to a referral sent by Mr. Erma Joubert Delisa's PCP Park Liter, MD.    Mr. Borman was given information about care management services today including:  1. Care management services include personalized support from designated clinical staff supervised by his physician, including individualized plan of care and coordination with other care providers 2. 24/7 contact phone numbers for assistance for urgent and routine care needs. 3. The patient may stop care management services at any time by phone call to the office staff.  Patient agreed to services and verbal consent obtained.   Follow up plan: Face to Face appointment with care management team member scheduled for: 11/03/2019  Noreene Larsson, [place], [place], [place] [address] Direct Dial: [phone] [email or web address]

## 2019-11-03 ENCOUNTER — Telehealth: Payer: 59

## 2019-11-03 NOTE — Progress Notes (Deleted)
Chronic Care Management Pharmacy  Name: Arthur [name]  MRN: [id number] DOB: Jan 04, 1966   Chief Complaint/ HPI  Arthur [name],  54 y.o. , male presents for their Initial CCM visit with the clinical pharmacist via telephone.Pharmacy asked to assist with CGM PCP : Valerie Roys, DO Patient Care Team: Valerie Roys, DO as PCP - General (Family Medicine) Kate Sable, MD as PCP - Cardiology (Cardiology) Gabriel Carina Betsey Holiday, MD as Consulting Physician (Endocrinology) Vladimir Faster, [facility] (Pharmacist)  Their chronic conditions include: Hypertension, Hyperlipidemia, Diabetes, BPH and Tachyarrythmia and ED   Office Visits: 10/03/19- Dr. Wynetta Emery -bloodwork, Xultophy 30 units,   Consult Visit: 03/24/19-Dr. Synthia Innocent, Cardiology - EKG, 2 week monitor neg for arrythmias  Allergies  Allergen Reactions  . Codeine Nausea Only  . Hydrocodone Nausea Only  . Oxycodone Nausea Only    Medications: Outpatient Encounter Medications as of 11/03/2019  Medication Sig Note  . aspirin 81 MG EC tablet Take 1 tablet (81 mg total) by mouth daily.   Marland Kitchen atorvastatin (LIPITOR) 40 MG tablet Take 2 tablets (80 mg total) by mouth daily.   . empagliflozin (JARDIANCE) 25 MG TABS tablet Take 1 tablet (25 mg total) by mouth daily.   . fluticasone (FLONASE) 50 MCG/ACT nasal spray Place 2 sprays into both nostrils daily. (Patient not taking: Reported on 10/03/2019) 10/03/2019: As needed  . Glucagon, rDNA, (GLUCAGON EMERGENCY) 1 MG KIT Inject 1 mg as directed daily as needed.   Marland Kitchen glucose blood test strip 1 each by Other route as needed for other. One touch ultra test strips. Use as instructed.   . Insulin Degludec-Liraglutide (XULTOPHY) 100-3.6 UNIT-MG/ML SOPN ADMINISTER 30 UNITS UNDER THE SKIN AT BEDTIME   . Insulin Pen Needle (NOVOFINE PLUS) 32G X 4 MM MISC Use one needle daily with insulin   . lansoprazole (PREVACID) 30 MG capsule Take 30 mg by mouth daily at 12 noon.   Marland Kitchen lisinopril (ZESTRIL) 5 MG tablet  Take 1 tablet (5 mg total) by mouth daily.   . metFORMIN (GLUCOPHAGE) 1000 MG tablet Take 1 tablet (1,000 mg total) by mouth 2 (two) times daily.   . Omega-3 Fatty Acids (FISH OIL PO) Take by mouth daily.   . sildenafil (VIAGRA) 100 MG tablet Take 0.5-1 tablets (50-100 mg total) by mouth daily as needed for erectile dysfunction.   Marland Kitchen UNABLE TO FIND Take by mouth daily. Teastosterone capsule. Take 2 capsules once daily    No facility-administered encounter medications on file as of 11/03/2019.    Wt Readings from Last 3 Encounters:  10/03/19 164 lb (74.4 kg)  03/24/19 152 lb (68.9 kg)  02/08/19 154 lb (69.9 kg)    Current Diagnosis/Assessment:    Goals Addressed   None     Diabetes   A1c goal <7%  Recent Relevant Labs: Lab Results  Component Value Date/Time   HGBA1C 9.5 (H) 10/03/2019 11:13 AM   HGBA1C 7.7 (H) 01/29/2019 03:28 AM   HGBA1C 7.3 (H) 12/20/2018 03:17 PM   HGBA1C 9.7 08/10/2016 12:00 AM   MICROALBUR 30 (H) 10/03/2019 11:13 AM   MICROALBUR 80 (H) 12/20/2018 03:17 PM    Last diabetic Eye exam:  Lab Results  Component Value Date/Time   HMDIABEYEEXA No Retinopathy 07/19/2017 12:00 AM    Last diabetic Foot exam:  Lab Results  Component Value Date/Time   HMDIABFOOTEX done 02/26/2014 12:00 AM     Checking BG: {CHL HP Blood Glucose Monitoring [phone]}  Recent FBG Readings: *** Recent pre-meal  BG readings: *** Recent 2hr PP BG readings:  *** Recent HS BG readings: ***  Patient has failed these meds in past: *** Patient is currently {CHL Controlled/Uncontrolled:[phone]} on the following medications: . Xultophy 30 units qhs . Metformin $RemoveBef'1000mg'lLhyuBLIOn$  bid . Jardiance 25 mg daily  We discussed: {CHL HP Upstream Pharmacy [phone]}  Plan  Continue {CHL HP Upstream Pharmacy Plans:[phone]}    Medication Management   Pt uses *** pharmacy for all medications Uses pill box? {Yes or If no, why not?:20788} Pt endorses ***%  compliance  We discussed: {Pharmacy options:24294}  Plan  {US Pharmacy KBTC:48185}    Follow up: *** month phone visit  ***

## 2019-11-07 ENCOUNTER — Ambulatory Visit: Payer: 59 | Admitting: Pharmacist

## 2019-11-07 NOTE — Chronic Care Management (AMB) (Signed)
Chronic Care Management   Note  11/08/2019 Name: Arthur [name] MRN: [id number] DOB: 09/23/1965  Reason for referral:  Help with CGM  Referral source: Dr. Johnson Referral medication(s): CGM Current insurance: [employer]  PMHx: T2DM uncontrolled with renal manifestations, Tachyarrythmia, HLD   HPI: Most recent HbA1c 9.5%,    Objective: Allergies  Allergen Reactions  . Codeine Nausea Only  . Hydrocodone Nausea Only  . Oxycodone Nausea Only    Medications Reviewed Today    Reviewed by Harris, Julie S, RPH (Pharmacist) on 11/08/19 at 0909  Med List Status: <None>  Medication Order Taking? Sig Documenting Provider Last Dose Status Informant  aspirin 81 MG EC tablet [id number]  Take 1 tablet (81 mg total) by mouth daily. Johnson, Megan P, DO  Active   atorvastatin (LIPITOR) 40 MG tablet [id number]  Take 2 tablets (80 mg total) by mouth daily. Johnson, Megan P, DO  Active   empagliflozin (JARDIANCE) 25 MG TABS tablet [id number]  Take 1 tablet (25 mg total) by mouth daily. Johnson, Megan P, DO  Active   fluticasone (FLONASE) 50 MCG/ACT nasal spray [id number] No Place 2 sprays into both nostrils daily.  Patient not taking: Reported on 10/03/2019   Johnson, Megan P, DO Not Taking Active Self           Med Note (QUITO, ANITA   Tue Oct 03, 2019 10:59 AM) As needed  Glucagon, rDNA, (GLUCAGON EMERGENCY) 1 MG KIT [id number]  Inject 1 mg as directed daily as needed. Johnson, Megan P, DO  Active   glucose blood test strip [id number] No 1 each by Other route as needed for other. One touch ultra test strips. Use as instructed. Johnson, Megan P, DO Taking Active Self  Insulin Degludec-Liraglutide (XULTOPHY) 100-3.6 UNIT-MG/ML SOPN [id number]  ADMINISTER 30 UNITS UNDER THE SKIN AT BEDTIME Johnson, Megan P, DO  Active   Insulin Pen Needle (NOVOFINE PLUS) 32G X 4 MM MISC [id number] No Use one needle daily with insulin Johnson, Megan P, DO Taking Active Self  lansoprazole (PREVACID) 30 MG capsule  [id number] No Take 30 mg by mouth daily at 12 noon. [provider] Taking Active   lisinopril (ZESTRIL) 5 MG tablet [id number]  Take 1 tablet (5 mg total) by mouth daily. Johnson, Megan P, DO  Active   metFORMIN (GLUCOPHAGE) 1000 MG tablet [id number]  Take 1 tablet (1,000 mg total) by mouth 2 (two) times daily. Johnson, Megan P, DO  Active   Omega-3 Fatty Acids (FISH OIL PO) [id number] No Take by mouth daily. [provider] Taking Active Self  sildenafil (VIAGRA) 100 MG tablet [id number]  Take 0.5-1 tablets (50-100 mg total) by mouth daily as needed for erectile dysfunction. Johnson, Megan P, DO  Active   UNABLE TO FIND [id number] No Take by mouth daily. Teastosterone capsule. Take 2 capsules once daily [provider] Taking Active Self          Assessment:  PCP referred for help obtaining CGM. Patient has commercial insurance.   Medication Review Findings:  . Xultophy 30 units qhs . Metformin 1000 mg bid . Jardiance 25 mg qd   Will ask CPA to call [employer] for preferred CGM device. [employer] allows cash pay or commercially insured patients to obtain 2 Freestyle Libre  sensors per month for $75/month max.   Plan: Will collaborate with PCP to send new prescription to patient's pharmacy.  Julie S. Harris PharmD, BCPS Clinical Pharmacist [facility] [phone]      

## 2019-11-28 DIAGNOSIS — Z0271 Encounter for disability determination: Secondary | ICD-10-CM

## 2019-12-12 ENCOUNTER — Ambulatory Visit: Payer: Self-pay | Admitting: *Deleted

## 2019-12-12 NOTE — Telephone Encounter (Signed)
FYI scheduled tomorrow with Dr Ky Barban

## 2019-12-12 NOTE — Telephone Encounter (Signed)
C/o dark colored blood under right great toenail noticed today. Patient reports toenail sore and looks cracked in center of toenail with white line under toenail. Patient asking if he could be seen today and tomorrow and earliest appt 12/15/19. Patient also requesting to get information on a glucose meter that check throughout the day. Patient very knowledgeable concerning foot care. Care advise given.. Patient verbalized understanding of care advise and to call back or go to UC if symptoms worsen. Please contact patient if earlier appt available than Friday 12/15/19.  Reason for Disposition . [1] Ulcer, wound, blister, sore, or red area AND [2] new or increasing  Answer Assessment - Initial Assessment Questions 1. SYMPTOM: "What's the main symptom you're concerned about?" (e.g., rash, sore, callus, drainage, numbness)     Right great toe sore and looks like dark colored blood under toenail 2. LOCATION: "Where is the  blood located?" (e.g., foot/toe, top/bottom, left/right)    underneath Right great toenail  3. ONSET: "When did the  Bleeding   start?"     Not sure just noticed today 4. PAIN: "Is there any pain?" If Yes, ask: "How bad is it?" (Scale: 1-10; mild, moderate, severe)     sorness 5. CAUSE: "What do you think is causing the symptoms?"     Not sure 6. OTHER SYMPTOMS: "Do you have any other symptoms?" (e.g., fever, weakness)     No  Protocols used: DIABETES - FOOT PROBLEMS AND QUESTIONS-A-AH

## 2019-12-13 ENCOUNTER — Encounter: Payer: Self-pay | Admitting: Family Medicine

## 2019-12-13 ENCOUNTER — Other Ambulatory Visit: Payer: Self-pay

## 2019-12-13 ENCOUNTER — Ambulatory Visit: Payer: 59 | Admitting: Family Medicine

## 2019-12-13 DIAGNOSIS — B351 Tinea unguium: Secondary | ICD-10-CM

## 2019-12-13 MED ORDER — TERBINAFINE HCL 250 MG PO TABS: 250.0000 mg | ORAL_TABLET | Freq: Every day | ORAL | 0 refills | Status: AC

## 2019-12-13 NOTE — Assessment & Plan Note (Signed)
Noted on exam to b/l great toes and R 2nd toe. Will tx with oral terbinafine, most recent LFTs wnl. Diabetic foot exam completed today and otherwise wnl.

## 2019-12-13 NOTE — Patient Instructions (Signed)
It was great to see you!  Our plans for today:  - Take the antifungal medication for 12 weeks. Let us know if you don't tolerate this. - I have reached out to our pharmacist to see if she has any updates on obtaining insurance approval for your CGM.  We'll see you next week for your diabetic check up!  Take care and seek immediate care sooner if you develop any concerns.   Dr. Ky Barban

## 2019-12-13 NOTE — Progress Notes (Signed)
   SUBJECTIVE:   CHIEF COMPLAINT / HPI: blood under toenail  Past Medical History:  Diagnosis Date  . Diabetes mellitus with renal complications ([place])   . Erectile dysfunction associated with type 2 diabetes mellitus ([place])   . Exotropia   . History of tobacco abuse   . Hyperlipidemia   . Hypertension   . Thoracic back pain    Toenail complaint - notes soreness to R great toe, noted some redness yesterday upon waking - denies any known trauma - no swelling, discharge, fevers. - able to bear weight and walk ok.  - CBGs 132 this am. Has diabetic f/u next week.   OBJECTIVE:   BP (!) 151/83   Pulse 73   Temp 98.4 F (36.9 C)   Wt 163 lb 8 oz (74.2 kg)   SpO2 97%   BMI 25.61 kg/m   Gen: well appearing, in NAD Ext: WWP, no edema. Slight hyperkeratosis and brittleness to b/l great toes with medial side of b/l great toes and R 2nd toe with discoloration. Intact DP pulses and sensation to light touch and monofilament exam.   ASSESSMENT/PLAN:   Onychomycosis Noted on exam to b/l great toes and R 2nd toe. Will tx with oral terbinafine, most recent LFTs wnl. Diabetic foot exam completed today and otherwise wnl.     Myles Gip, DO

## 2019-12-15 ENCOUNTER — Ambulatory Visit: Payer: Self-pay | Admitting: Family Medicine

## 2019-12-22 ENCOUNTER — Other Ambulatory Visit: Payer: Self-pay | Admitting: Family Medicine

## 2019-12-22 NOTE — Telephone Encounter (Signed)
Requested medication (s) are due for refill today:No  Requested medication (s) are on the active medication list: yes  Last refill:  12/13/19  #84  0 refills  Future visit scheduled: yes  Notes to clinic: Off protocol  Should not need refill received 84 on 12/13/19    Requested Prescriptions  Pending Prescriptions Disp Refills   terbinafine (LAMISIL) 250 MG tablet [Pharmacy Med Name: TERBINAFINE 250MG  TABLETS] 84 tablet 0    Sig: TAKE 1 TABLET(250 MG) BY MOUTH DAILY      Off-Protocol Failed - 12/22/2019  2:34 PM      Failed - Medication not assigned to a protocol, review manually.      Passed - Valid encounter within last 12 months    Recent Outpatient Visits           1 week ago Onychomycosis   [place], [place]   2 months ago Benign hypertensive renal disease   [facility] [place], [place], [place]   10 months ago [place], [place], [place]   11 months ago Benign hypertensive renal disease   [facility] Merrie Roof [place], [place]   11 months ago Benign hypertensive renal disease   [facility] [place], [place]       Future Appointments             In 1 week Wynetta Emery, Barb Merino, DO [employer], [facility]

## 2019-12-23 DIAGNOSIS — Z20822 Contact with and (suspected) exposure to covid-19: Secondary | ICD-10-CM | POA: Diagnosis not present

## 2020-01-02 ENCOUNTER — Ambulatory Visit: Payer: 59 | Admitting: Family Medicine

## 2020-02-01 ENCOUNTER — Telehealth: Payer: Self-pay | Admitting: Pharmacist

## 2020-02-01 NOTE — Progress Notes (Addendum)
Chronic Care Management Pharmacy Assistant   Name: Arthur [name]  MRN: [id number] DOB: 1966-01-05  Reason for Encounter: Patient Assistance with Continuous Blood Glucose Meter    PCP : Valerie Roys, DO  Allergies:   Allergies  Allergen Reactions   Codeine Nausea Only   Hydrocodone Nausea Only   Oxycodone Nausea Only    Medications: Outpatient Encounter Medications as of 02/01/2020  Medication Sig Note   aspirin 81 MG EC tablet Take 1 tablet (81 mg total) by mouth daily.    atorvastatin (LIPITOR) 40 MG tablet Take 2 tablets (80 mg total) by mouth daily.    fluticasone (FLONASE) 50 MCG/ACT nasal spray Place 2 sprays into both nostrils daily. (Patient not taking: Reported on 10/03/2019) 10/03/2019: As needed   Glucagon, rDNA, (GLUCAGON EMERGENCY) 1 MG KIT Inject 1 mg as directed daily as needed.    glucose blood test strip 1 each by Other route as needed for other. One touch ultra test strips. Use as instructed.    Insulin Degludec-Liraglutide (XULTOPHY) 100-3.6 UNIT-MG/ML SOPN ADMINISTER 30 UNITS UNDER THE SKIN AT BEDTIME    Insulin Pen Needle (NOVOFINE PLUS) 32G X 4 MM MISC Use one needle daily with insulin    lansoprazole (PREVACID) 30 MG capsule Take 30 mg by mouth daily at 12 noon.    lisinopril (ZESTRIL) 5 MG tablet Take 1 tablet (5 mg total) by mouth daily.    metFORMIN (GLUCOPHAGE) 1000 MG tablet Take 1 tablet (1,000 mg total) by mouth 2 (two) times daily.    Omega-3 Fatty Acids (FISH OIL PO) Take by mouth daily.    sildenafil (VIAGRA) 100 MG tablet Take 0.5-1 tablets (50-100 mg total) by mouth daily as needed for erectile dysfunction.    terbinafine (LAMISIL) 250 MG tablet Take 1 tablet (250 mg total) by mouth daily.    UNABLE TO FIND Take by mouth daily. Teastosterone capsule. Take 2 capsules once daily    No facility-administered encounter medications on file as of 02/01/2020.    Current Diagnosis: Patient Active Problem List   Diagnosis Date Noted    Onychomycosis 12/13/2019   COVID-19 01/30/2019   Tachyarrhythmia 01/30/2019   Hypokalemia    Chronic pain of left knee 12/07/2014   Benign prostatic hyperplasia 09/05/2014   Benign hypertensive renal disease    Type II diabetes mellitus with renal manifestations, uncontrolled ([place]) 06/12/2014   Dyslipidemia 06/12/2014   Vasculogenic erectile dysfunction 06/12/2014   Exotropia, monocular 06/12/2014   Erectile dysfunction associated with type 2 diabetes mellitus ([place])     02-01-2020: The patient reached out for assistance related to obtaining a continuous blood glucose meter. After researching options for the patient and his type of insurance I was able to find a program with [employer] that would provide the patient with free meters for one month. I called the patient and explained my findings. I explained that he would need to reach out to his insurance company to see his co-pay for the [employer] blood sugar monitor. The patient explained he would call and find out as soon as possible. He requested for me to enroll him into the [employer] program for a free 62-month trial. The patient provided me with his email address so I would be able to register him for the free trial. Confirmed his e-mail address back to him.  He stated that he would call me back after he speaks with his insurance company about his co-pay. Provided the patient with my direct  phone number. He was able to confirm my phone number back. Enrolled the patient into the [employer] [place] 1 month trial program as requested. Made PharmD Birdena Crandall aware of the situation.   Cloretta Ned, LPN Clinical Pharmacist Assistant  [phone]    Follow-Up:  Pharmacist Review  I have reviewed the care management and care coordination activities outlined in this encounter and I am certifying that I agree with the content of this note.  Junita Push. Kenton Kingfisher PharmD, [place] [facility] [phone]

## 2020-02-02 ENCOUNTER — Telehealth: Payer: Self-pay

## 2020-02-02 NOTE — Telephone Encounter (Signed)
Attempted to initiate PA for Xultophy, eligibility could not be verified on Cover My Meds.  Tried calling patient to see if his insurance changed at the beginning of the year. No answer and VM full. Will try to call again later. If patient calls back, please verify insurance information with him.

## 2020-02-05 ENCOUNTER — Other Ambulatory Visit: Payer: Self-pay | Admitting: Family Medicine

## 2020-02-05 NOTE — Telephone Encounter (Signed)
Patient called regarding his PA for medication.  Please call patient to go over what he needs to do to get this approved.  CB# [phone]

## 2020-02-06 ENCOUNTER — Telehealth: Payer: Self-pay | Admitting: Pharmacist

## 2020-02-06 NOTE — Progress Notes (Addendum)
Chronic Care Management Pharmacy Assistant   Name: CLESTER CHLEBOWSKI  MRN: [id number] DOB: Nov 01, 1965  Reason for Encounter: Incoming Call related to blood glucose monitor.    PCP : Valerie Roys, DO  Allergies:   Allergies  Allergen Reactions   Codeine Nausea Only   Hydrocodone Nausea Only   Oxycodone Nausea Only    Medications: Outpatient Encounter Medications as of 02/06/2020  Medication Sig Note   aspirin 81 MG EC tablet Take 1 tablet (81 mg total) by mouth daily.    atorvastatin (LIPITOR) 40 MG tablet Take 2 tablets (80 mg total) by mouth daily.    fluticasone (FLONASE) 50 MCG/ACT nasal spray Place 2 sprays into both nostrils daily. (Patient not taking: Reported on 10/03/2019) 10/03/2019: As needed   Glucagon, rDNA, (GLUCAGON EMERGENCY) 1 MG KIT Inject 1 mg as directed daily as needed.    glucose blood test strip 1 each by Other route as needed for other. One touch ultra test strips. Use as instructed.    Insulin Degludec-Liraglutide (XULTOPHY) 100-3.6 UNIT-MG/ML SOPN ADMINISTER 30 UNITS UNDER THE SKIN AT BEDTIME    Insulin Pen Needle (NOVOFINE PLUS) 32G X 4 MM MISC Use one needle daily with insulin    lansoprazole (PREVACID) 30 MG capsule Take 30 mg by mouth daily at 12 noon.    lisinopril (ZESTRIL) 5 MG tablet Take 1 tablet (5 mg total) by mouth daily.    metFORMIN (GLUCOPHAGE) 1000 MG tablet Take 1 tablet (1,000 mg total) by mouth 2 (two) times daily.    Omega-3 Fatty Acids (FISH OIL PO) Take by mouth daily.    sildenafil (VIAGRA) 100 MG tablet Take 0.5-1 tablets (50-100 mg total) by mouth daily as needed for erectile dysfunction.    terbinafine (LAMISIL) 250 MG tablet Take 1 tablet (250 mg total) by mouth daily.    UNABLE TO FIND Take by mouth daily. Teastosterone capsule. Take 2 capsules once daily    No facility-administered encounter medications on file as of 02/06/2020.    Current Diagnosis: Patient Active Problem List   Diagnosis Date Noted   Onychomycosis  12/13/2019   COVID-19 01/30/2019   Tachyarrhythmia 01/30/2019   Hypokalemia    Chronic pain of left knee 12/07/2014   Benign prostatic hyperplasia 09/05/2014   Benign hypertensive renal disease    Type II diabetes mellitus with renal manifestations, uncontrolled ([place]) 06/12/2014   Dyslipidemia 06/12/2014   Vasculogenic erectile dysfunction 06/12/2014   Exotropia, monocular 06/12/2014   Erectile dysfunction associated with type 2 diabetes mellitus ([place])     02/07/2020: The patient called explaining that his insurance company stated they would provide full coverage for a [employer], but would need some additional information from his PCP. The patient explained the insurance company explained a new prescription for a freestyle libre monitor would need to be sent his pharmacy of choice. I confirmed his pharmacy, which is the same pharmacy that is currently in his chart.  I verbalized an understanding of all information given and stated I would forward his request to the PCP office. The patient confirmed understanding of all information given.  Sent staff message to PharmD Birdena Crandall with the information above and requesting her to forward the request to the PCP office. She verbalized understanding and forward the request to the PCP office.   Cloretta Ned, LPN Clinical Pharmacist Assistant  [phone]     Follow-Up:  Pharmacist Review  I have reviewed the care management and care coordination activities outlined in this  encounter and I am certifying that I agree with the content of this note.  Junita Push. Kenton Kingfisher PharmD, [place] [facility] [phone]

## 2020-02-06 NOTE — Telephone Encounter (Signed)
Should not be out of either of these.

## 2020-02-06 NOTE — Telephone Encounter (Signed)
Requested medications are due for refill today.  yes  Requested medications are on the active medications list.  yes  Last refill. 10/03/2019  Future visit scheduled.  no  Notes to clinic.  BP from 12/13/2019 failed protocol Pt was to be seen 01/02/2020 for recheck.

## 2020-02-07 NOTE — Telephone Encounter (Signed)
Tried calling patient, no answer and no VM.   Please verify patient's insurance if he calls back.

## 2020-02-12 ENCOUNTER — Other Ambulatory Visit: Payer: Self-pay | Admitting: Physician Assistant

## 2020-02-12 NOTE — Telephone Encounter (Signed)
PA for Xultophy initiated and approved via Cover My Meds. Key: [id number]   LVM notifying patient of approval.

## 2020-03-07 ENCOUNTER — Other Ambulatory Visit: Payer: Self-pay | Admitting: Family Medicine
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
## 2020-05-06 ENCOUNTER — Other Ambulatory Visit: Payer: Self-pay | Admitting: Family Medicine

## 2020-05-06 NOTE — Telephone Encounter (Signed)
Scheduled for 5/23 pt completely out

## 2020-05-06 NOTE — Telephone Encounter (Signed)
Requested medication (s) are due for refill today:no  Requested medication (s) are on the active medication list: yes   Future visit scheduled: no  Notes to clinic: Patient due for follow up appointment on blood Due for labs    Requested Prescriptions  Pending Prescriptions Disp Refills   lisinopril (ZESTRIL) 5 MG tablet [Pharmacy Med Name: LISINOPRIL 5MG  TABLETS] 90 tablet 1    Sig: TAKE 1 TABLET(5 MG) BY MOUTH DAILY      Cardiovascular:  ACE Inhibitors Failed - 05/06/2020 10:17 AM      Failed - Cr in normal range and within 180 days    Creatinine, Ser  Date Value Ref Range Status  10/03/2019 1.23 0.76 - 1.27 mg/dL Final          Failed - K in normal range and within 180 days    Potassium  Date Value Ref Range Status  10/03/2019 4.2 3.5 - 5.2 mmol/L Final          Failed - Last BP in normal range    BP Readings from Last 1 Encounters:  12/13/19 (!) 151/83          Passed - Patient is not pregnant      Passed - Valid encounter within last 6 months    Recent Outpatient Visits           4 months ago Onychomycosis   [place] Myles Gip, DO   7 months ago Benign hypertensive renal disease   [facility] [place], [place], DO   1 year ago [place], [place], DO   1 year ago Benign hypertensive renal disease   [place], Rachel Elizabeth, [place]   1 year ago Benign hypertensive renal disease   [place], Rachel Elizabeth, PA-C                  atorvastatin (LIPITOR) 40 MG tablet [Pharmacy Med Name: ATORVASTATIN 40MG  TABLETS] 180 tablet 1    Sig: TAKE 2 TABLETS(80 MG) BY MOUTH DAILY      Cardiovascular:  Antilipid - Statins Failed - 05/06/2020 10:17 AM      Failed - HDL in normal range and within 360 days    HDL  Date Value Ref Range Status  10/03/2019 38 (L) >39 mg/dL Final          Passed - Total Cholesterol in normal range and within 360 days     Cholesterol, Total  Date Value Ref Range Status  10/03/2019 131 100 - 199 mg/dL Final          Passed - LDL in normal range and within 360 days    LDL Chol Calc (NIH)  Date Value Ref Range Status  10/03/2019 76 0 - 99 mg/dL Final          Passed - Triglycerides in normal range and within 360 days    Triglycerides  Date Value Ref Range Status  10/03/2019 90 0 - 149 mg/dL Final          Passed - Patient is not pregnant      Passed - Valid encounter within last 12 months    Recent Outpatient Visits           4 months ago Onychomycosis   [facility] My[place] Gip, DO   7 months ago Benign hypertensive renal disease   [facility] [place], [place], DO  1 year ago [place], [place], DO   1 year ago Benign hypertensive renal disease   [facility] Merrie Roof [place], [place]   1 year ago Benign hypertensive renal disease   [facility] Merrie Roof [place], [place]

## 2020-05-08 ENCOUNTER — Other Ambulatory Visit: Payer: Self-pay | Admitting: Family Medicine

## 2020-05-08 NOTE — Telephone Encounter (Signed)
Patient must keep.upcoming appointment. Requested Prescriptions  Pending Prescriptions Disp Refills  . metFORMIN (GLUCOPHAGE) 1000 MG tablet [Pharmacy Med Name: METFORMIN $RemoveBeforeD'1000MG'jztlXDEekcmnhu$  TABLETS] 60 tablet 0    Sig: TAKE 1 TABLET(1000 MG) BY MOUTH TWICE DAILY     Endocrinology:  Diabetes - Biguanides Failed - 05/08/2020  7:57 PM      Failed - HBA1C is between 0 and 7.9 and within 180 days    Hemoglobin A1C  Date Value Ref Range Status  08/10/2016 9.7  Final   HB A1C (BAYER DCA - WAIVED)  Date Value Ref Range Status  10/03/2019 9.5 (H) <7.0 % Final    Comment:                                          Diabetic Adult            <7.0                                       Healthy Adult        4.3 - 5.7                                                           (DCCT/NGSP) American Diabetes Association's Summary of Glycemic Recommendations for Adults with Diabetes: Hemoglobin A1c <7.0%. More stringent glycemic goals (A1c <6.0%) may further reduce complications at the cost of increased risk of hypoglycemia.          Passed - Cr in normal range and within 360 days    Creatinine, Ser  Date Value Ref Range Status  10/03/2019 1.23 0.76 - 1.27 mg/dL Final         Passed - AA eGFR in normal range and within 360 days    GFR calc Af Amer  Date Value Ref Range Status  10/03/2019 75 >59 mL/min/1.73 Final    Comment:    **[facility] currently reports eGFR in compliance with the current**   recommendations of the [employer]. [facility] will   update reporting as new guidelines are published from the NKF-ASN   Task force.    GFR calc non Af Amer  Date Value Ref Range Status  10/03/2019 65 >59 mL/min/1.73 Final         Passed - Valid encounter within last 6 months    Recent Outpatient Visits          4 months ago Onychomycosis   [place], DO   7 months ago Benign hypertensive renal disease   [facility] [place], [place], DO   1 year ago  [place], [place], DO   1 year ago Benign hypertensive renal disease   [facility] [place], [place]   1 year ago Benign hypertensive renal disease   [facility] [place], [place]      Future Appointments            In 2 weeks Wynetta Emery, Barb Merino, DO [employer], [facility]

## 2020-05-27 ENCOUNTER — Telehealth: Payer: Self-pay

## 2020-05-27 ENCOUNTER — Ambulatory Visit
Admission: RE | Admit: 2020-05-27 | Discharge: 2020-05-27 | Disposition: A | Discharge status: Home / Self Care | Payer: 59 | Source: Ambulatory Visit | Attending: Family Medicine | Admitting: Family Medicine

## 2020-05-27 ENCOUNTER — Ambulatory Visit (INDEPENDENT_AMBULATORY_CARE_PROVIDER_SITE_OTHER): Payer: 59 | Admitting: Family Medicine

## 2020-05-27 ENCOUNTER — Other Ambulatory Visit: Payer: Self-pay

## 2020-05-27 ENCOUNTER — Encounter: Payer: Self-pay | Admitting: Family Medicine

## 2020-05-27 VITALS — BP 125/78 | HR 66 | Temp 97.8°F

## 2020-05-27 DIAGNOSIS — IMO0002 Reserved for concepts with insufficient information to code with codable children: Secondary | ICD-10-CM

## 2020-05-27 DIAGNOSIS — E1129 Type 2 diabetes mellitus with other diabetic kidney complication: Secondary | ICD-10-CM | POA: Diagnosis not present

## 2020-05-27 DIAGNOSIS — I129 Hypertensive chronic kidney disease with stage 1 through stage 4 chronic kidney disease, or unspecified chronic kidney disease: Secondary | ICD-10-CM | POA: Diagnosis not present

## 2020-05-27 DIAGNOSIS — E785 Hyperlipidemia, unspecified: Secondary | ICD-10-CM

## 2020-05-27 DIAGNOSIS — N400 Enlarged prostate without lower urinary tract symptoms: Secondary | ICD-10-CM | POA: Diagnosis not present

## 2020-05-27 DIAGNOSIS — R053 Chronic cough: Secondary | ICD-10-CM | POA: Diagnosis not present

## 2020-05-27 DIAGNOSIS — E1165 Type 2 diabetes mellitus with hyperglycemia: Secondary | ICD-10-CM | POA: Diagnosis not present

## 2020-05-27 DIAGNOSIS — R059 Cough, unspecified: Secondary | ICD-10-CM | POA: Diagnosis not present

## 2020-05-27 LAB — MICROALBUMIN, URINE WAIVED
Creatinine, Urine Waived: 300 mg/dL (ref 10–300)
Microalb, Ur Waived: 150 mg/L — ABNORMAL HIGH (ref 0–19)

## 2020-05-27 LAB — URINALYSIS, ROUTINE W REFLEX MICROSCOPIC
Bilirubin, UA: NEGATIVE
Glucose, UA: NEGATIVE
Ketones, UA: NEGATIVE
Leukocytes,UA: NEGATIVE
Nitrite, UA: NEGATIVE
RBC, UA: NEGATIVE
Specific Gravity, UA: 1.015 (ref 1.005–1.030)
Urobilinogen, Ur: 1 mg/dL (ref 0.2–1.0)
pH, UA: 5.5 (ref 5.0–7.5)

## 2020-05-27 LAB — MICROSCOPIC EXAMINATION
Bacteria, UA: NONE SEEN
RBC, Urine: NONE SEEN /hpf (ref 0–2)
WBC, UA: NONE SEEN /hpf (ref 0–5)

## 2020-05-27 LAB — BAYER DCA HB A1C WAIVED: HB A1C (BAYER DCA - WAIVED): 10.4 % — ABNORMAL HIGH (ref <7.0)

## 2020-05-27 MED ORDER — METFORMIN HCL 1000 MG PO TABS: ORAL_TABLET | ORAL | 1 refills | Status: DC

## 2020-05-27 MED ORDER — TRESIBA FLEXTOUCH 200 UNIT/ML ~~LOC~~ SOPN: 30.0000 [IU] | PEN_INJECTOR | Freq: Every day | SUBCUTANEOUS | 2 refills | Status: DC

## 2020-05-27 MED ORDER — LOSARTAN POTASSIUM 25 MG PO TABS: 12.5000 mg | ORAL_TABLET | Freq: Every day | ORAL | 1 refills | Status: DC

## 2020-05-27 MED ORDER — OZEMPIC (0.25 OR 0.5 MG/DOSE) 2 MG/1.5ML ~~LOC~~ SOPN: 0.5000 mg | PEN_INJECTOR | SUBCUTANEOUS | 2 refills | Status: DC

## 2020-05-27 MED ORDER — FLUTICASONE PROPIONATE 50 MCG/ACT NA SUSP: 2.0000 | Freq: Every day | NASAL | 6 refills | Status: DC

## 2020-05-27 MED ORDER — OMEPRAZOLE 20 MG PO CPDR: 20.0000 mg | DELAYED_RELEASE_CAPSULE | Freq: Every day | ORAL | 3 refills | Status: DC

## 2020-05-27 NOTE — Assessment & Plan Note (Signed)
Under good control on current regimen. Continue current regimen. Continue to monitor. Call with any concerns. Refills given. Labs drawn today.   

## 2020-05-27 NOTE — Assessment & Plan Note (Signed)
Not under good control with A1c of 10.4- will change from xultophy to tresiba and ozempic and titrate up as needed. He has about a month of xultophy left- so will finish that and then start the other meds. Recheck 2 months. Call with any concerns or if not getting better.

## 2020-05-27 NOTE — Telephone Encounter (Signed)
Have not received PA for sildenafil. Received PA for Ozempic and [place].

## 2020-05-27 NOTE — Progress Notes (Signed)
BP 125/78   Pulse 66   Temp 97.8 F (36.6 C)   SpO2 98%    Subjective:    Patient ID: Arthur [name], male    DOB: 1965/06/07, 55 y.o.   MRN: [id number]  HPI: Arthur [name] is a 55 y.o. male  Chief Complaint  Patient presents with  . Diabetes  . Hypertension   COUGH Duration: months Circumstances of initial development of cough: nothing Cough severity: mild Cough description: tickle Aggravating factors:  worse in the AM Alleviating factors: cough drops and nasal CCS Status:  fluctuating Treatments attempted: cough drops and nasal CCS Wheezing: no Shortness of breath: no Chest pain: no Chest tightness:no Nasal congestion: yes Runny nose: yes Postnasal drip: yes Frequent throat clearing or swallowing: no Hemoptysis: unsure  Fevers: no Night sweats: no Weight loss: no Heartburn: yes Recent foreign travel: no Tuberculosis contacts: no  DIABETES Hypoglycemic episodes:no Polydipsia/polyuria: no Visual disturbance: no Chest pain: no Paresthesias: no Glucose Monitoring: yes  Accucheck frequency: Daily Taking Insulin?: yes Blood Pressure Monitoring: not checking Retinal Examination: Not up to Date Foot Exam: Up to Date Diabetic Education: Completed Pneumovax: Up to Date Influenza: Up to Date Aspirin: yes  HYPERTENSION / HYPERLIPIDEMIA Satisfied with current treatment? yes Duration of hypertension: chronic BP monitoring frequency: not checking BP medication side effects: no Past BP meds: lisinopril Duration of hyperlipidemia: chronic Cholesterol medication side effects: no Cholesterol supplements: none Past cholesterol medications: atorvastatin Medication compliance: excellent compliance Aspirin: yes Recent stressors: no Recurrent headaches: no Visual changes: no Palpitations: no Dyspnea: no Chest pain: no Lower extremity edema: no Dizzy/lightheaded: no  Relevant past medical, surgical, family and social history reviewed and updated as  indicated. Interim medical history since our last visit reviewed. Allergies and medications reviewed and updated.  Review of Systems  Constitutional: Negative.   Respiratory: Positive for cough. Negative for apnea, choking, chest tightness, shortness of breath, wheezing and stridor.   Cardiovascular: Negative.   Gastrointestinal: Negative.   Musculoskeletal: Negative.   Skin: Negative.   Psychiatric/Behavioral: Negative.     Per HPI unless specifically indicated above     Objective:    BP 125/78   Pulse 66   Temp 97.8 F (36.6 C)   SpO2 98%   Wt Readings from Last 3 Encounters:  12/13/19 163 lb 8 oz (74.2 kg)  10/03/19 164 lb (74.4 kg)  03/24/19 152 lb (68.9 kg)    Physical Exam Vitals and nursing note reviewed.  Constitutional:      General: He is not in acute distress.    Appearance: Normal appearance. He is not ill-appearing, toxic-appearing or diaphoretic.  HENT:     Head: Normocephalic and atraumatic.     Right Ear: External ear normal.     Left Ear: External ear normal.     Nose: Nose normal.     Mouth/Throat:     Mouth: Mucous membranes are moist.     Pharynx: Oropharynx is clear.  Eyes:     General: No scleral icterus.       Right eye: No discharge.        Left eye: No discharge.     Extraocular Movements: Extraocular movements intact.     Conjunctiva/sclera: Conjunctivae normal.     Pupils: Pupils are equal, round, and reactive to light.  Cardiovascular:     Rate and Rhythm: Normal rate and regular rhythm.     Pulses: Normal pulses.     Heart sounds: Normal heart sounds. No murmur  heard. No friction rub. No gallop.   Pulmonary:     Effort: Pulmonary effort is normal. No respiratory distress.     Breath sounds: Normal breath sounds. No stridor. No wheezing, rhonchi or rales.  Chest:     Chest wall: No tenderness.  Musculoskeletal:        General: Normal range of motion.     Cervical back: Normal range of motion and neck supple.  Skin:    General:  Skin is warm and dry.     Capillary Refill: Capillary refill takes less than 2 seconds.     Coloration: Skin is not jaundiced or pale.     Findings: No bruising, erythema, lesion or rash.  Neurological:     General: No focal deficit present.     Mental Status: He is alert and oriented to person, place, and time. Mental status is at baseline.  Psychiatric:        Mood and Affect: Mood normal.        Behavior: Behavior normal.        Thought Content: Thought content normal.        Judgment: Judgment normal.     Results for orders placed or performed in visit on 05/27/20  Microscopic Examination   BLD  Result Value Ref Range   WBC, UA None seen 0 - 5 /hpf   RBC None seen 0 - 2 /hpf   Epithelial Cells (non renal) 0-10 0 - 10 /hpf   Casts Present (A) None seen /lpf   Cast Type Hyaline casts N/A   Mucus, UA Present (A) Not Estab.   Bacteria, UA None seen None seen/Few  Bayer DCA Hb A1c Waived  Result Value Ref Range   HB A1C (BAYER DCA - WAIVED) 10.4 (H) <7.0 %  Microalbumin, Urine Waived  Result Value Ref Range   Microalb, Ur Waived 150 (H) 0 - 19 mg/L   Creatinine, Urine Waived 300 10 - 300 mg/dL   Microalb/Creat Ratio 30-300 (H) <30 mg/g  Urinalysis, Routine w reflex microscopic  Result Value Ref Range   Specific Gravity, UA 1.015 1.005 - 1.030   pH, UA 5.5 5.0 - 7.5   Color, UA Yellow Yellow   Appearance Ur Clear Clear   Leukocytes,UA Negative Negative   Protein,UA 2+ (A) Negative/Trace   Glucose, UA Negative Negative   Ketones, UA Negative Negative   RBC, UA Negative Negative   Bilirubin, UA Negative Negative   Urobilinogen, Ur 1.0 0.2 - 1.0 mg/dL   Nitrite, UA Negative Negative   Microscopic Examination See below:       Assessment & Plan:   Problem List Items Addressed This Visit      Endocrine   Type II diabetes mellitus with renal manifestations, uncontrolled (HCC)    Not under good control with A1c of 10.4- will change from xultophy to tresiba and ozempic  and titrate up as needed. He has about a month of xultophy left- so will finish that and then start the other meds. Recheck 2 months. Call with any concerns or if not getting better.       Relevant Medications   losartan (COZAAR) 25 MG tablet   metFORMIN (GLUCOPHAGE) 1000 MG tablet   insulin degludec (TRESIBA FLEXTOUCH) 200 UNIT/ML FlexTouch Pen   Semaglutide,0.25 or 0.5MG /DOS, (OZEMPIC, 0.25 OR 0.5 MG/DOSE,) 2 MG/1.5ML SOPN   Other Relevant Orders   Bayer DCA Hb A1c Waived (Completed)   Microalbumin, Urine Waived (Completed)   Comprehensive metabolic panel  CBC with Differential/Platelet   Urinalysis, Routine w reflex microscopic (Completed)     Genitourinary   Benign hypertensive renal disease    Under good control on current regimen. Continue current regimen. Continue to monitor. Call with any concerns. Refills given. Labs drawn today.      Relevant Orders   Microalbumin, Urine Waived (Completed)   Comprehensive metabolic panel   CBC with Differential/Platelet   TSH   Benign prostatic hyperplasia    Under good control on current regimen. Continue current regimen. Continue to monitor. Call with any concerns. Refills given. Labs drawn today.      Relevant Orders   Comprehensive metabolic panel   CBC with Differential/Platelet   PSA     Other   Dyslipidemia    Under good control on current regimen. Continue current regimen. Continue to monitor. Call with any concerns. Refills given. Labs drawn today.       Relevant Orders   Comprehensive metabolic panel   CBC with Differential/Platelet   Lipid Panel w/o Chol/HDL Ratio    Other Visit Diagnoses    Chronic cough    -  Primary   Of unclear etiology. Will stop lisinopril and change to losartan, obtain CXR and start omeprazole. Recheck 2 months. Call with any concerns.    Relevant Orders   DG Chest 2 View       Follow up plan: Return in about 2 months (around 07/27/2020).

## 2020-05-27 NOTE — Patient Instructions (Signed)
Finish up what you have on the xultophy Then start 30 units tresiba nighty 0.25mg  ozempic weekly   Until you see me

## 2020-05-27 NOTE — Telephone Encounter (Signed)
Copied from [place] [phone]. Topic: General - Other >> May 27, 2020  1:00 PM Leward Quan A wrote: Reason for CRM: Patient called I to inform Dr Wynetta Emery that when he went to the Pharmacy they told him that the sildenafil (VIAGRA) 100 MG tablet  is not approved by his insurance asking for prior auth to be done so he can get this medication please Ph# [phone]

## 2020-05-28 ENCOUNTER — Telehealth: Payer: Self-pay

## 2020-05-28 LAB — LIPID PANEL W/O CHOL/HDL RATIO
Cholesterol, Total: 172 mg/dL (ref 100–199)
HDL: 45 mg/dL (ref >39)
LDL Chol Calc (NIH): 110 mg/dL — ABNORMAL HIGH (ref 0–99)
Triglycerides: 89 mg/dL (ref 0–149)
VLDL Cholesterol Cal: 17 mg/dL (ref 5–40)

## 2020-05-28 LAB — CBC WITH DIFFERENTIAL/PLATELET
Basophils Absolute: 0 10*3/uL (ref 0.0–0.2)
Basos: 0 %
EOS (ABSOLUTE): 0.2 10*3/uL (ref 0.0–0.4)
Eos: 4 %
Hematocrit: 43.3 % (ref 37.5–51.0)
Hemoglobin: 14.1 g/dL (ref 13.0–17.7)
Immature Grans (Abs): 0 10*3/uL (ref 0.0–0.1)
Immature Granulocytes: 0 %
Lymphocytes Absolute: 2.4 10*3/uL (ref 0.7–3.1)
Lymphs: 38 %
MCH: 27.3 pg (ref 26.6–33.0)
MCHC: 32.6 g/dL (ref 31.5–35.7)
MCV: 84 fL (ref 79–97)
Monocytes Absolute: 0.5 10*3/uL (ref 0.1–0.9)
Monocytes: 8 %
Neutrophils Absolute: 3.1 10*3/uL (ref 1.4–7.0)
Neutrophils: 50 %
Platelets: 335 10*3/uL (ref 150–450)
RBC: 5.17 x10E6/uL (ref 4.14–5.80)
RDW: 12.9 % (ref 11.6–15.4)
WBC: 6.2 10*3/uL (ref 3.4–10.8)

## 2020-05-28 LAB — TSH: TSH: 1.96 u[IU]/mL (ref 0.450–4.500)

## 2020-05-28 LAB — COMPREHENSIVE METABOLIC PANEL
ALT: 20 IU/L (ref 0–44)
AST: 20 IU/L (ref 0–40)
Albumin/Globulin Ratio: 2 (ref 1.2–2.2)
Albumin: 4.8 g/dL (ref 3.8–4.9)
Alkaline Phosphatase: 131 IU/L — ABNORMAL HIGH (ref 44–121)
BUN/Creatinine Ratio: 14 (ref 9–20)
BUN: 15 mg/dL (ref 6–24)
Bilirubin Total: 0.4 mg/dL (ref 0.0–1.2)
CO2: 21 mmol/L (ref 20–29)
Calcium: 10.1 mg/dL (ref 8.7–10.2)
Chloride: 100 mmol/L (ref 96–106)
Creatinine, Ser: 1.09 mg/dL (ref 0.76–1.27)
Globulin, Total: 2.4 g/dL (ref 1.5–4.5)
Glucose: 206 mg/dL — ABNORMAL HIGH (ref 65–99)
Potassium: 4.3 mmol/L (ref 3.5–5.2)
Sodium: 139 mmol/L (ref 134–144)
Total Protein: 7.2 g/dL (ref 6.0–8.5)
eGFR: 79 mL/min/{1.73_m2} (ref >59)

## 2020-05-28 LAB — PSA: Prostate Specific Ag, Serum: 1.1 ng/mL (ref 0.0–4.0)

## 2020-05-28 NOTE — Telephone Encounter (Signed)
PA for Tresiba flex touch initiated via CoverMyMeds Key: [id number] Update: Approved

## 2020-05-28 NOTE — Telephone Encounter (Signed)
Patient aware Ozempic and Tyler Aas have been approved.

## 2020-05-28 NOTE — Telephone Encounter (Signed)
PA initiated for Ozempic (0.25 or 0.5 MG/DOSE) via CoverMyMeds Key: [id number]  Status: Approved

## 2020-05-29 ENCOUNTER — Telehealth: Payer: Self-pay

## 2020-05-29 NOTE — Telephone Encounter (Signed)
PA initiated for Sildenafil 100MG  tablet via CoverMyMeds Waiting on response. Key: [id number]

## 2020-05-30 ENCOUNTER — Other Ambulatory Visit: Payer: Self-pay

## 2020-05-30 NOTE — Telephone Encounter (Signed)
PA for Sildenafil Citrate 100MG  tablets denied via CoverMyMeds.  Fax with details in provider folder.

## 2020-06-06 NOTE — Telephone Encounter (Signed)
Patient called in to inform Dr Wynetta Emery that the PA was denied but states that Dr Wynetta Emery can appeal the decision and he may be able to get it. Also would like to know if there are any samples for him to pick up at the office. Patient would like a call back please to discuss appeals process. Ph#   [phone]

## 2020-06-06 NOTE — Telephone Encounter (Signed)
Please see message below

## 2020-06-06 NOTE — Telephone Encounter (Signed)
Please advise 

## 2020-06-06 NOTE — Telephone Encounter (Signed)
Unfortunately that is a medication that is not often covered by insurance. We can send it to [facility] which usually can get it less expensive, but it's usully something that needs to be paid for out of pocket.

## 2020-06-06 NOTE — Telephone Encounter (Signed)
Attempted to call patient to inform of provider advise. No answer, no voicemail set up will try to call again. If patient calls back please inform of provider advise.

## 2020-06-07 MED ORDER — SILDENAFIL CITRATE 100 MG PO TABS: 50.0000 mg | ORAL_TABLET | Freq: Every day | ORAL | 11 refills | Status: DC | PRN

## 2020-06-07 NOTE — Addendum Note (Signed)
Addended by: Georgina Peer on: 06/07/2020 02:42 PM   Modules accepted: Orders

## 2020-06-07 NOTE — Telephone Encounter (Signed)
Patient notified of Dr. Durenda Age message. He would like to have the prescription sent to [facility].

## 2020-07-29 ENCOUNTER — Ambulatory Visit: Payer: 59 | Admitting: Family Medicine

## 2020-08-08 LAB — HM DIABETES EYE EXAM

## 2020-09-13 ENCOUNTER — Other Ambulatory Visit: Payer: Self-pay | Admitting: Family Medicine

## 2020-09-14 NOTE — Telephone Encounter (Signed)
Requested Prescriptions  Pending Prescriptions Disp Refills  . metFORMIN (GLUCOPHAGE) 1000 MG tablet [Pharmacy Med Name: METFORMIN $RemoveBeforeD'1000MG'SJTBorSzBRdMsd$  TABLETS] 180 tablet 0    Sig: TAKE 1 TABLET BY MOUTH TWICE DAILY     Endocrinology:  Diabetes - Biguanides Failed - 09/13/2020  4:09 PM      Failed - HBA1C is between 0 and 7.9 and within 180 days    Hemoglobin A1C  Date Value Ref Range Status  08/10/2016 9.7  Final   HB A1C (BAYER DCA - WAIVED)  Date Value Ref Range Status  05/27/2020 10.4 (H) <7.0 % Final    Comment:                                          Diabetic Adult            <7.0                                       Healthy Adult        4.3 - 5.7                                                           (DCCT/NGSP) American Diabetes Association's Summary of Glycemic Recommendations for Adults with Diabetes: Hemoglobin A1c <7.0%. More stringent glycemic goals (A1c <6.0%) may further reduce complications at the cost of increased risk of hypoglycemia.          Passed - Cr in normal range and within 360 days    Creatinine, Ser  Date Value Ref Range Status  05/27/2020 1.09 0.76 - 1.27 mg/dL Final         Passed - AA eGFR in normal range and within 360 days    GFR calc Af Amer  Date Value Ref Range Status  10/03/2019 75 >59 mL/min/1.73 Final    Comment:    **[facility] currently reports eGFR in compliance with the current**   recommendations of the [employer]. [facility] will   update reporting as new guidelines are published from the NKF-ASN   Task force.    GFR calc non Af Amer  Date Value Ref Range Status  10/03/2019 65 >59 mL/min/1.73 Final   eGFR  Date Value Ref Range Status  05/27/2020 79 >59 mL/min/1.73 Final         Passed - Valid encounter within last 6 months    Recent Outpatient Visits          3 months ago Chronic cough   [place], [place], DO   9 months ago Onychomycosis   [place], DO   11  months ago Benign hypertensive renal disease   [facility] [place], [place], DO   1 year ago Flatulence   [place], DO   1 year ago Benign hypertensive renal disease   [facility] Merrie Roof [place], [place]

## 2020-10-14 ENCOUNTER — Other Ambulatory Visit: Payer: Self-pay | Admitting: Family Medicine

## 2020-10-14 NOTE — Telephone Encounter (Signed)
Patient will contact [place] and have pharmacy reach out to [facility] and pull sildenafil (VIAGRA) 100 MG tablet refills

## 2020-11-02 ENCOUNTER — Other Ambulatory Visit: Payer: Self-pay | Admitting: Family Medicine

## 2020-11-02 NOTE — Telephone Encounter (Signed)
Requested Prescriptions  Pending Prescriptions Disp Refills  . ASPIRIN LOW DOSE 81 MG EC tablet [Pharmacy Med Name: ASPIRIN 81MG  EC LOW DOSE TABLETS] 90 tablet 0    Sig: TAKE 1 TABLET(81 MG) BY MOUTH DAILY     Analgesics:  NSAIDS - aspirin Passed - 11/02/2020  6:33 AM      Passed - Patient is not pregnant      Passed - Valid encounter within last 12 months    Recent Outpatient Visits          5 months ago Chronic cough   [place], [place], DO   10 months ago Onychomycosis   [facility] Myles Gip, DO   1 year ago Benign hypertensive renal disease   [facility] Valerie Roys, DO   1 year ago Flatulence   [place], [place], DO   1 year ago Benign hypertensive renal disease   [facility] Merrie Roof [place], [place]

## 2020-11-12 ENCOUNTER — Other Ambulatory Visit: Payer: Self-pay | Admitting: Family Medicine

## 2020-11-12 NOTE — Telephone Encounter (Signed)
Requested Prescriptions  Pending Prescriptions Disp Refills  . omeprazole (PRILOSEC) 20 MG capsule [Pharmacy Med Name: OMEPRAZOLE 20MG  CAPSULES] 30 capsule 3    Sig: TAKE 1 CAPSULE(20 MG) BY MOUTH DAILY     Gastroenterology: Proton Pump Inhibitors Passed - 11/12/2020  1:27 PM      Passed - Valid encounter within last 12 months    Recent Outpatient Visits          5 months ago Chronic cough   [place], [place], DO   11 months ago Onychomycosis   [facility] Myles Gip, DO   1 year ago Benign hypertensive renal disease   [place], [place], DO   1 year ago Flatulence   [place], [place], DO   1 year ago Benign hypertensive renal disease   [facility] Merrie Roof [place], [place]

## 2020-11-13 ENCOUNTER — Other Ambulatory Visit: Payer: Self-pay | Admitting: Family Medicine

## 2020-11-13 NOTE — Telephone Encounter (Signed)
Requested medications are due for refill today.  yes  Requested medications are on the active medications list.  yes  Last refill. 05/27/2020  Future visit scheduled.   no  Notes to clinic.  Per note from 05/27/2020 visit, patient was to rtc on 07/29/2020. Pt did not show for that appointment.

## 2020-11-18 ENCOUNTER — Other Ambulatory Visit: Payer: Self-pay | Admitting: Family Medicine

## 2020-11-18 NOTE — Telephone Encounter (Signed)
Courtesy refill. Patient will need an office visit for further refills. Requested Prescriptions  Pending Prescriptions Disp Refills  . metFORMIN (GLUCOPHAGE) 1000 MG tablet [Pharmacy Med Name: METFORMIN $RemoveBeforeD'1000MG'OmXCsmTEIckTib$  TABLETS] 60 tablet 0    Sig: TAKE 1 TABLET BY MOUTH TWICE DAILY     Endocrinology:  Diabetes - Biguanides Failed - 11/18/2020  4:54 PM      Failed - HBA1C is between 0 and 7.9 and within 180 days    Hemoglobin A1C  Date Value Ref Range Status  08/10/2016 9.7  Final   HB A1C (BAYER DCA - WAIVED)  Date Value Ref Range Status  05/27/2020 10.4 (H) <7.0 % Final    Comment:                                          Diabetic Adult            <7.0                                       Healthy Adult        4.3 - 5.7                                                           (DCCT/NGSP) American Diabetes Association's Summary of Glycemic Recommendations for Adults with Diabetes: Hemoglobin A1c <7.0%. More stringent glycemic goals (A1c <6.0%) may further reduce complications at the cost of increased risk of hypoglycemia.          Failed - AA eGFR in normal range and within 360 days    GFR calc Af Amer  Date Value Ref Range Status  10/03/2019 75 >59 mL/min/1.73 Final    Comment:    **[facility] currently reports eGFR in compliance with the current**   recommendations of the [employer]. [facility] will   update reporting as new guidelines are published from the NKF-ASN   Task force.    GFR calc non Af Amer  Date Value Ref Range Status  10/03/2019 65 >59 mL/min/1.73 Final   eGFR  Date Value Ref Range Status  05/27/2020 79 >59 mL/min/1.73 Final         Passed - Cr in normal range and within 360 days    Creatinine, Ser  Date Value Ref Range Status  05/27/2020 1.09 0.76 - 1.27 mg/dL Final         Passed - Valid encounter within last 6 months    Recent Outpatient Visits          5 months ago Chronic cough   [place], [place], DO   11  months ago Onychomycosis   [facility] Myles Gip, DO   1 year ago Benign hypertensive renal disease   [facility] [place], Megan [place], DO   1 year ago [place], [place], DO   1 year ago Benign hypertensive renal disease   [place], Rachel [place], PA-C             . OZEMPIC, 0.25 OR 0.5 MG/DOSE, 2 MG/1.5ML SOPN [  Pharmacy Med Name: OZEMPIC 0.25 OR 0.$RemoveB'5MG'xyFcQQNP$ /DOS 1X'2MG'$  PEN] 1.5 mL 0    Sig: INJECT 0.$RemoveBefor'5MG'hjOBJPBZcuye$  INTO THE SKIN ONCE A WEEK     Endocrinology:  Diabetes - GLP-1 Receptor Agonists Failed - 11/18/2020  4:54 PM      Failed - HBA1C is between 0 and 7.9 and within 180 days    Hemoglobin A1C  Date Value Ref Range Status  08/10/2016 9.7  Final   HB A1C (BAYER DCA - WAIVED)  Date Value Ref Range Status  05/27/2020 10.4 (H) <7.0 % Final    Comment:                                          Diabetic Adult            <7.0                                       Healthy Adult        4.3 - 5.7                                                           (DCCT/NGSP) American Diabetes Association's Summary of Glycemic Recommendations for Adults with Diabetes: Hemoglobin A1c <7.0%. More stringent glycemic goals (A1c <6.0%) may further reduce complications at the cost of increased risk of hypoglycemia.          Passed - Valid encounter within last 6 months    Recent Outpatient Visits          5 months ago Chronic cough   [place], [place], [place]   11 months ago Onychomycosis   [place], DO   1 year ago Benign hypertensive renal disease   [facility] Valerie Roys, DO   1 year ago Flatulence   [place], [place], DO   1 year ago Benign hypertensive renal disease   [facility] Merrie Roof [place], [place]

## 2020-11-22 ENCOUNTER — Other Ambulatory Visit: Payer: Self-pay | Admitting: Family Medicine

## 2020-11-23 NOTE — Telephone Encounter (Signed)
Requested medication (s) are due for refill today: -  Requested medication (s) are on the active medication list: yes  Last refill:  05/27/20  Future visit scheduled: no  Notes to clinic:  expired 06/26/20   Requested Prescriptions  Pending Prescriptions Disp Refills   TRESIBA FLEXTOUCH 200 UNIT/ML FlexTouch Pen [Pharmacy Med Name: TRESIBA FLEXTOUCH PEN(U-200)INJ 3ML] 3 mL     Sig: ADMINISTER [place]     Endocrinology:  Diabetes - Insulins Failed - 11/22/2020  4:42 PM      Failed - HBA1C is between 0 and 7.9 and within 180 days    Hemoglobin A1C  Date Value Ref Range Status  08/10/2016 9.7  Final   HB A1C (BAYER DCA - WAIVED)  Date Value Ref Range Status  05/27/2020 10.4 (H) <7.0 % Final    Comment:                                          Diabetic Adult            <7.0                                       Healthy Adult        4.3 - 5.7                                                           (DCCT/NGSP) American Diabetes Association's Summary of Glycemic Recommendations for Adults with Diabetes: Hemoglobin A1c <7.0%. More stringent glycemic goals (A1c <6.0%) may further reduce complications at the cost of increased risk of hypoglycemia.           Passed - Valid encounter within last 6 months    Recent Outpatient Visits           6 months ago Chronic cough   [place], [place], [place]   11 months ago Onychomycosis   [place], DO   1 year ago Benign hypertensive renal disease   [facility] Valerie Roys, DO   1 year ago Flatulence   [place], [place], DO   1 year ago Benign hypertensive renal disease   [facility] Merrie Roof [place], [place]

## 2020-11-25 NOTE — Telephone Encounter (Signed)
TRESIBA FLEXTOUCH PEN(U-200)INJ 3ML  Pt is requesting a courtesy refill for Arthur [name], he is completely out.  Refill appointment is scheduled for 12/10/2020 with PCP.

## 2020-12-10 ENCOUNTER — Other Ambulatory Visit: Payer: Self-pay

## 2020-12-10 ENCOUNTER — Encounter: Payer: Self-pay | Admitting: Family Medicine

## 2020-12-10 ENCOUNTER — Ambulatory Visit (INDEPENDENT_AMBULATORY_CARE_PROVIDER_SITE_OTHER): Payer: 59 | Admitting: Family Medicine

## 2020-12-10 VITALS — BP 117/80 | HR 86 | Temp 98.0°F | Ht 67.0 in | Wt 150.2 lb

## 2020-12-10 DIAGNOSIS — E1121 Type 2 diabetes mellitus with diabetic nephropathy: Secondary | ICD-10-CM | POA: Diagnosis not present

## 2020-12-10 DIAGNOSIS — Z794 Long term (current) use of insulin: Secondary | ICD-10-CM | POA: Diagnosis not present

## 2020-12-10 DIAGNOSIS — E785 Hyperlipidemia, unspecified: Secondary | ICD-10-CM

## 2020-12-10 DIAGNOSIS — N400 Enlarged prostate without lower urinary tract symptoms: Secondary | ICD-10-CM

## 2020-12-10 DIAGNOSIS — I129 Hypertensive chronic kidney disease with stage 1 through stage 4 chronic kidney disease, or unspecified chronic kidney disease: Secondary | ICD-10-CM | POA: Diagnosis not present

## 2020-12-10 DIAGNOSIS — Z23 Encounter for immunization: Secondary | ICD-10-CM

## 2020-12-10 MED ORDER — SUCRALFATE 1 G PO TABS: 1.0000 g | ORAL_TABLET | Freq: Three times a day (TID) | ORAL | 6 refills | Status: DC

## 2020-12-10 MED ORDER — SEMAGLUTIDE (1 MG/DOSE) 4 MG/3ML ~~LOC~~ SOPN: 1.0000 mg | PEN_INJECTOR | SUBCUTANEOUS | 1 refills | Status: DC

## 2020-12-10 MED ORDER — FLUTICASONE PROPIONATE 50 MCG/ACT NA SUSP: 2.0000 | Freq: Every day | NASAL | 6 refills | Status: DC

## 2020-12-10 MED ORDER — OMEPRAZOLE 20 MG PO CPDR: DELAYED_RELEASE_CAPSULE | ORAL | 1 refills | Status: DC

## 2020-12-10 MED ORDER — METFORMIN HCL 1000 MG PO TABS: 1000.0000 mg | ORAL_TABLET | Freq: Two times a day (BID) | ORAL | 1 refills | Status: DC

## 2020-12-10 MED ORDER — ATORVASTATIN CALCIUM 40 MG PO TABS: ORAL_TABLET | ORAL | 1 refills | Status: DC

## 2020-12-10 MED ORDER — TRESIBA FLEXTOUCH 200 UNIT/ML ~~LOC~~ SOPN: 30.0000 [IU] | PEN_INJECTOR | Freq: Every day | SUBCUTANEOUS | 0 refills | Status: DC

## 2020-12-10 MED ORDER — LOSARTAN POTASSIUM 25 MG PO TABS
12.5000 mg | ORAL_TABLET | Freq: Every day | ORAL | 1 refills | Status: DC
Start: 2020-12-10 — End: 2021-05-21

## 2020-12-10 MED ORDER — SILDENAFIL CITRATE 100 MG PO TABS: ORAL_TABLET | ORAL | 12 refills | Status: DC

## 2020-12-10 NOTE — Assessment & Plan Note (Signed)
Doing better with A1c of 8.9 down from 10.4- will increase his ozempic and recheck 3 months. Call with any concerns. Continue to monitor.

## 2020-12-10 NOTE — Progress Notes (Signed)
BP 117/80   Pulse 86   Temp 98 F (36.7 C)   Ht 5\' 7"  (1.702 m)   Wt 150 lb 3.2 oz (68.1 kg)   SpO2 98%   BMI 23.52 kg/m    Subjective:    Patient ID: Arthur [name], male    DOB: 04-05-1965, 55 y.o.   MRN: [id number]  HPI: [name] is a 55 y.o. male  Chief Complaint  Patient presents with   Diabetes   Hypertension   DIABETES Hypoglycemic episodes:yes Polydipsia/polyuria: no Visual disturbance: no Chest pain: no Paresthesias: no Glucose Monitoring: yes  Accucheck frequency: Daily  Fasting glucose: about 100 Taking Insulin?: yes Blood Pressure Monitoring: not checking Retinal Examination: Up to Date Foot Exam: Up to Date Diabetic Education: Completed Pneumovax: Up to Date Influenza: Up to Date Aspirin: yes  HYPERTENSION / HYPERLIPIDEMIA Satisfied with current treatment? yes Duration of hypertension: chronic BP monitoring frequency: not checking BP medication side effects: yes Past BP meds: losartan Duration of hyperlipidemia: chronic Cholesterol medication side effects: no Cholesterol supplements: none Past cholesterol medications: atorvastatin Medication compliance: excellent compliance Aspirin: yes Recent stressors: no Recurrent headaches: no Visual changes: no Palpitations: no Dyspnea: no Chest pain: no Lower extremity edema: no Dizzy/lightheaded: no   Relevant past medical, surgical, family and social history reviewed and updated as indicated. Interim medical history since our last visit reviewed. Allergies and medications reviewed and updated.  Review of Systems  Constitutional: Negative.   Respiratory: Negative.    Cardiovascular: Negative.   Gastrointestinal: Negative.   Musculoskeletal: Negative.   Neurological: Negative.   Psychiatric/Behavioral: Negative.     Per HPI unless specifically indicated above     Objective:    BP 117/80   Pulse 86   Temp 98 F (36.7 C)   Ht 5\' 7"  (1.702 m)   Wt 150 lb 3.2 oz (68.1 kg)    SpO2 98%   BMI 23.52 kg/m   Wt Readings from Last 3 Encounters:  12/10/20 150 lb 3.2 oz (68.1 kg)  12/13/19 163 lb 8 oz (74.2 kg)  10/03/19 164 lb (74.4 kg)    Physical Exam Vitals and nursing note reviewed.  Constitutional:      General: He is not in acute distress.    Appearance: Normal appearance. He is not ill-appearing, toxic-appearing or diaphoretic.  HENT:     Head: Normocephalic and atraumatic.     Right Ear: External ear normal.     Left Ear: External ear normal.     Nose: Nose normal.     Mouth/Throat:     Mouth: Mucous membranes are moist.     Pharynx: Oropharynx is clear.  Eyes:     General: No scleral icterus.       Right eye: No discharge.        Left eye: No discharge.     Extraocular Movements: Extraocular movements intact.     Conjunctiva/sclera: Conjunctivae normal.     Pupils: Pupils are equal, round, and reactive to light.  Cardiovascular:     Rate and Rhythm: Normal rate and regular rhythm.     Pulses: Normal pulses.     Heart sounds: Normal heart sounds. No murmur heard.   No friction rub. No gallop.  Pulmonary:     Effort: Pulmonary effort is normal. No respiratory distress.     Breath sounds: Normal breath sounds. No stridor. No wheezing, rhonchi or rales.  Chest:     Chest wall: No tenderness.  Musculoskeletal:  General: Normal range of motion.     Cervical back: Normal range of motion and neck supple.  Skin:    General: Skin is warm and dry.     Capillary Refill: Capillary refill takes less than 2 seconds.     Coloration: Skin is not jaundiced or pale.     Findings: No bruising, erythema, lesion or rash.  Neurological:     General: No focal deficit present.     Mental Status: He is alert and oriented to person, place, and time. Mental status is at baseline.  Psychiatric:        Mood and Affect: Mood normal.        Behavior: Behavior normal.        Thought Content: Thought content normal.        Judgment: Judgment normal.     Results for orders placed or performed in visit on 08/09/20  HM DIABETES EYE EXAM  Result Value Ref Range   HM Diabetic Eye Exam No Retinopathy No Retinopathy      Assessment & Plan:   Problem List Items Addressed This Visit       Endocrine   Type 2 diabetes mellitus with renal manifestations ([place]) - Primary    Doing better with A1c of 8.9 down from 10.4- will increase his ozempic and recheck 3 months. Call with any concerns. Continue to monitor.       Relevant Medications   Semaglutide, 1 MG/DOSE, 4 MG/3ML SOPN   metFORMIN (GLUCOPHAGE) 1000 MG tablet   losartan (COZAAR) 25 MG tablet   insulin degludec (TRESIBA FLEXTOUCH) 200 UNIT/ML FlexTouch Pen   atorvastatin (LIPITOR) 40 MG tablet   Other Relevant Orders   Bayer DCA Hb A1c Waived   Comprehensive metabolic panel   CBC with Differential/Platelet   Urinalysis, Routine w reflex microscopic   Microalbumin, Urine Waived     Genitourinary   Benign hypertensive renal disease    Under good control on current regimen. Continue current regimen. Continue to monitor. Call with any concerns. Refills given. Labs drawn today.       Relevant Orders   Comprehensive metabolic panel   CBC with Differential/Platelet   TSH   Urinalysis, Routine w reflex microscopic   Microalbumin, Urine Waived   Benign prostatic hyperplasia    Rechecking labs today. Await results. Treat as needed.       Relevant Orders   Comprehensive metabolic panel   CBC with Differential/Platelet   PSA   Microalbumin, Urine Waived     Other   Dyslipidemia    Under good control on current regimen. Continue current regimen. Continue to monitor. Call with any concerns. Refills given. Labs drawn today.        Relevant Medications   atorvastatin (LIPITOR) 40 MG tablet   Other Relevant Orders   Comprehensive metabolic panel   CBC with Differential/Platelet   Lipid Panel w/o Chol/HDL Ratio     Follow up plan: Return in about 3 months (around  03/10/2021).

## 2020-12-10 NOTE — Assessment & Plan Note (Signed)
Under good control on current regimen. Continue current regimen. Continue to monitor. Call with any concerns. Refills given. Labs drawn today.   

## 2020-12-10 NOTE — Assessment & Plan Note (Signed)
Rechecking labs today. Await results. Treat as needed.  °

## 2020-12-11 LAB — URINALYSIS, ROUTINE W REFLEX MICROSCOPIC
Bilirubin, UA: NEGATIVE
Glucose, UA: NEGATIVE
Ketones, UA: NEGATIVE
Leukocytes,UA: NEGATIVE
Nitrite, UA: NEGATIVE
RBC, UA: NEGATIVE
Specific Gravity, UA: 1.015 (ref 1.005–1.030)
Urobilinogen, Ur: 0.2 mg/dL (ref 0.2–1.0)
pH, UA: 5.5 (ref 5.0–7.5)

## 2020-12-11 LAB — COMPREHENSIVE METABOLIC PANEL
ALT: 19 IU/L (ref 0–44)
AST: 20 IU/L (ref 0–40)
Albumin/Globulin Ratio: 1.9 (ref 1.2–2.2)
Albumin: 4.8 g/dL (ref 3.8–4.9)
Alkaline Phosphatase: 127 IU/L — ABNORMAL HIGH (ref 44–121)
BUN/Creatinine Ratio: 7 — ABNORMAL LOW (ref 9–20)
BUN: 8 mg/dL (ref 6–24)
Bilirubin Total: 0.3 mg/dL (ref 0.0–1.2)
CO2: 21 mmol/L (ref 20–29)
Calcium: 10.2 mg/dL (ref 8.7–10.2)
Chloride: 102 mmol/L (ref 96–106)
Creatinine, Ser: 1.12 mg/dL (ref 0.76–1.27)
Globulin, Total: 2.5 g/dL (ref 1.5–4.5)
Glucose: 162 mg/dL — ABNORMAL HIGH (ref 70–99)
Potassium: 4.7 mmol/L (ref 3.5–5.2)
Sodium: 140 mmol/L (ref 134–144)
Total Protein: 7.3 g/dL (ref 6.0–8.5)
eGFR: 77 mL/min/{1.73_m2} (ref >59)

## 2020-12-11 LAB — LIPID PANEL W/O CHOL/HDL RATIO
Cholesterol, Total: 176 mg/dL (ref 100–199)
HDL: 37 mg/dL — ABNORMAL LOW (ref >39)
LDL Chol Calc (NIH): 101 mg/dL — ABNORMAL HIGH (ref 0–99)
Triglycerides: 222 mg/dL — ABNORMAL HIGH (ref 0–149)
VLDL Cholesterol Cal: 38 mg/dL (ref 5–40)

## 2020-12-11 LAB — CBC WITH DIFFERENTIAL/PLATELET
Basophils Absolute: 0 10*3/uL (ref 0.0–0.2)
Basos: 0 %
EOS (ABSOLUTE): 0.4 10*3/uL (ref 0.0–0.4)
Eos: 5 %
Hematocrit: 44.8 % (ref 37.5–51.0)
Hemoglobin: 14.3 g/dL (ref 13.0–17.7)
Immature Grans (Abs): 0 10*3/uL (ref 0.0–0.1)
Immature Granulocytes: 0 %
Lymphocytes Absolute: 2.8 10*3/uL (ref 0.7–3.1)
Lymphs: 34 %
MCH: 26.8 pg (ref 26.6–33.0)
MCHC: 31.9 g/dL (ref 31.5–35.7)
MCV: 84 fL (ref 79–97)
Monocytes Absolute: 0.6 10*3/uL (ref 0.1–0.9)
Monocytes: 7 %
Neutrophils Absolute: 4.3 10*3/uL (ref 1.4–7.0)
Neutrophils: 54 %
Platelets: 412 10*3/uL (ref 150–450)
RBC: 5.34 x10E6/uL (ref 4.14–5.80)
RDW: 13.1 % (ref 11.6–15.4)
WBC: 8.1 10*3/uL (ref 3.4–10.8)

## 2020-12-11 LAB — PSA: Prostate Specific Ag, Serum: 0.9 ng/mL (ref 0.0–4.0)

## 2020-12-11 LAB — MICROALBUMIN, URINE WAIVED
Creatinine, Urine Waived: 200 mg/dL (ref 10–300)
Microalb, Ur Waived: 80 mg/L — ABNORMAL HIGH (ref 0–19)

## 2020-12-11 LAB — BAYER DCA HB A1C WAIVED: HB A1C (BAYER DCA - WAIVED): 8.9 % — ABNORMAL HIGH (ref 4.8–5.6)

## 2020-12-11 LAB — TSH: TSH: 2.38 u[IU]/mL (ref 0.450–4.500)

## 2020-12-23 ENCOUNTER — Telehealth: Payer: Self-pay | Admitting: Family Medicine

## 2020-12-23 NOTE — Telephone Encounter (Signed)
Copied from [place] [id number]. Topic: General - Other >> Dec 23, 2020  2:40 PM Pawlus, Brayton Layman A wrote: Reason for CRM: caller from [employer] was following up on Diagnosis codes needed for the pts medications, please advise.

## 2020-12-25 NOTE — Telephone Encounter (Signed)
Patient has 15 meds on his problem list. Which meds do they need?

## 2020-12-26 NOTE — Telephone Encounter (Signed)
Attempted to call back, no answer. Will try to call again.

## 2021-01-04 ENCOUNTER — Other Ambulatory Visit: Payer: Self-pay | Admitting: Family Medicine

## 2021-01-04 NOTE — Telephone Encounter (Signed)
dc'd 12/10/20 Paulette Fox RN   (dose was increased)  Requested Prescriptions  Refused Prescriptions Disp Refills   OZEMPIC, 0.25 OR 0.5 MG/DOSE, 2 MG/1.5ML SOPN [Pharmacy Med Name: OZEMPIC 0.25 OR 0.5MG /DOS 1X2MG  PEN] 1.5 mL 0    Sig: INJECT 0.5MG  INTO SKIN ONCE A WEEK     Endocrinology:  Diabetes - GLP-1 Receptor Agonists Failed - 01/04/2021  9:33 AM      Failed - HBA1C is between 0 and 7.9 and within 180 days    Hemoglobin A1C  Date Value Ref Range Status  08/10/2016 9.7  Final   HB A1C (BAYER DCA - WAIVED)  Date Value Ref Range Status  12/10/2020 8.9 (H) 4.8 - 5.6 % Final    Comment:             Prediabetes: 5.7 - 6.4          Diabetes: >6.4          Glycemic control for adults with diabetes: <7.0          Passed - Valid encounter within last 6 months    Recent Outpatient Visits          3 weeks ago Type 2 diabetes mellitus with diabetic nephropathy, with long-term current use of insulin ([place])   [place], [place], DO   7 months ago Chronic cough   [place], [place], DO   1 year ago Onychomycosis   [place], [place], DO   1 year ago Benign hypertensive renal disease   [facility] [place], Megan P, DO   1 year ago Flatulence   [place], [place], DO

## 2021-02-03 ENCOUNTER — Other Ambulatory Visit: Payer: Self-pay | Admitting: Family Medicine

## 2021-02-04 NOTE — Telephone Encounter (Signed)
Requested Prescriptions  Pending Prescriptions Disp Refills   TRESIBA FLEXTOUCH 200 UNIT/ML FlexTouch Pen [Pharmacy Med Name: TRESIBA FLEXTOUCH PEN(U-200)INJ 3ML] 3 mL 0    Sig: ADMINISTER [place] SKIN DAILY     Endocrinology:  Diabetes - Insulins Failed - 02/03/2021 12:03 PM      Failed - HBA1C is between 0 and 7.9 and within 180 days    Hemoglobin A1C  Date Value Ref Range Status  08/10/2016 9.7  Final   HB A1C (BAYER DCA - WAIVED)  Date Value Ref Range Status  12/10/2020 8.9 (H) 4.8 - 5.6 % Final    Comment:             Prediabetes: 5.7 - 6.4          Diabetes: >6.4          Glycemic control for adults with diabetes: <7.0          Passed - Valid encounter within last 6 months    Recent Outpatient Visits          1 month ago Type 2 diabetes mellitus with diabetic nephropathy, with long-term current use of insulin ([place])   [place], Megan P, DO   8 months ago Chronic cough   [place], [place], DO   1 year ago Onychomycosis   [facility] Myles Gip, DO   1 year ago Benign hypertensive renal disease   [facility] [place], Megan P, DO   1 year ago [place], Megan P, DO              OZEMPIC, 1 MG/DOSE, 4 MG/3ML SOPN [Pharmacy Med Name: OZEMPIC 1MG  PER DOSE (1X4MG  PEN)] 3 mL 2    Sig: INJECT 1MG  AS DIRECTED WEEKLY     Endocrinology:  Diabetes - GLP-1 Receptor Agonists Failed - 02/03/2021 12:03 PM      Failed - HBA1C is between 0 and 7.9 and within 180 days    Hemoglobin A1C  Date Value Ref Range Status  08/10/2016 9.7  Final   HB A1C (BAYER DCA - WAIVED)  Date Value Ref Range Status  12/10/2020 8.9 (H) 4.8 - 5.6 % Final    Comment:             Prediabetes: 5.7 - 6.4          Diabetes: >6.4          Glycemic control for adults with diabetes: <7.0          Passed - Valid encounter within last 6 months    Recent Outpatient Visits           1 month ago Type 2 diabetes mellitus with diabetic nephropathy, with long-term current use of insulin ([place])   [place], [place], DO   8 months ago Chronic cough   [place], [place], DO   1 year ago Onychomycosis   [place], [place], DO   1 year ago Benign hypertensive renal disease   [facility] [place], Megan P, DO   1 year ago Flatulence   [employer], Megan P, DO              ASPIRIN LOW DOSE 81 MG EC tablet [Pharmacy Med Name: ASPIRIN 81MG  EC LOW DOSE  TABLETS] 90 tablet 0    Sig: TAKE 1 TABLET(81 MG) BY MOUTH  DAILY     Analgesics:  NSAIDS - aspirin Passed - 02/03/2021 12:03 PM      Passed - Patient is not pregnant      Passed - Valid encounter within last 12 months    Recent Outpatient Visits          1 month ago Type 2 diabetes mellitus with diabetic nephropathy, with long-term current use of insulin ([place])   [place], [place], DO   8 months ago Chronic cough   [place], [place], DO   1 year ago Onychomycosis   [facility] Myles Gip, DO   1 year ago Benign hypertensive renal disease   [facility] [place], Megan P, DO   1 year ago Flatulence   [place], [place], DO

## 2021-02-04 NOTE — Telephone Encounter (Signed)
Pt is scheduled for fu 03/11/2021

## 2021-02-04 NOTE — Telephone Encounter (Signed)
Requested medications are due for refill today.  unsure  Requested medications are on the active medications list.  no  Last refill. 12/10/2020  Future visit scheduled.   no  Notes to clinic.  Rx ended 01/09/2021.    Requested Prescriptions  Pending Prescriptions Disp Refills   TRESIBA FLEXTOUCH 200 UNIT/ML FlexTouch Pen [Pharmacy Med Name: TRESIBA FLEXTOUCH PEN(U-200)INJ 3ML] 3 mL 0    Sig: ADMINISTER [place] SKIN DAILY     Endocrinology:  Diabetes - Insulins Failed - 02/03/2021 12:03 PM      Failed - HBA1C is between 0 and 7.9 and within 180 days    Hemoglobin A1C  Date Value Ref Range Status  08/10/2016 9.7  Final   HB A1C (BAYER DCA - WAIVED)  Date Value Ref Range Status  12/10/2020 8.9 (H) 4.8 - 5.6 % Final    Comment:             Prediabetes: 5.7 - 6.4          Diabetes: >6.4          Glycemic control for adults with diabetes: <7.0           Passed - Valid encounter within last 6 months    Recent Outpatient Visits           1 month ago Type 2 diabetes mellitus with diabetic nephropathy, with long-term current use of insulin ([place])   [place], Megan P, DO   8 months ago Chronic cough   [place], [place], DO   1 year ago Onychomycosis   [facility] Myles Gip, DO   1 year ago Benign hypertensive renal disease   [facility] Johnson, Megan P, DO   1 year ago [place], [place], DO              Signed Prescriptions Disp Refills   OZEMPIC, 1 MG/DOSE, 4 MG/3ML SOPN 3 mL 2    Sig: INJECT 1MG  AS DIRECTED WEEKLY     Endocrinology:  Diabetes - GLP-1 Receptor Agonists Failed - 02/03/2021 12:03 PM      Failed - HBA1C is between 0 and 7.9 and within 180 days    Hemoglobin A1C  Date Value Ref Range Status  08/10/2016 9.7  Final   HB A1C (BAYER DCA - WAIVED)  Date Value Ref Range Status  12/10/2020 8.9 (H) 4.8 - 5.6 % Final    Comment:              Prediabetes: 5.7 - 6.4          Diabetes: >6.4          Glycemic control for adults with diabetes: <7.0           Passed - Valid encounter within last 6 months    Recent Outpatient Visits           1 month ago Type 2 diabetes mellitus with diabetic nephropathy, with long-term current use of insulin ([place])   [place], [place], DO   8 months ago Chronic cough   [place], [place], DO   1 year ago Onychomycosis   [place], [place], DO   1 year ago Benign hypertensive renal disease   [facility] Alysa Duca [place], [place], DO   1 year ago Flatulence   [employer], [place], DO  ASPIRIN LOW DOSE 81 MG EC tablet 90 tablet 0    Sig: TAKE 1 TABLET(81 MG) BY MOUTH DAILY     Analgesics:  NSAIDS - aspirin Passed - 02/03/2021 12:03 PM      Passed - Patient is not pregnant      Passed - Valid encounter within last 12 months    Recent Outpatient Visits           1 month ago Type 2 diabetes mellitus with diabetic nephropathy, with long-term current use of insulin ([place])   [place], [place], DO   8 months ago Chronic cough   [place], [place], DO   1 year ago Onychomycosis   [facility] Myles Gip, DO   1 year ago Benign hypertensive renal disease   [facility] [place], Megan P, DO   1 year ago Flatulence   [place], [place], DO

## 2021-02-17 ENCOUNTER — Other Ambulatory Visit: Payer: Self-pay | Admitting: Family Medicine

## 2021-02-18 NOTE — Telephone Encounter (Signed)
Requested medication (s) are due for refill today -no  Requested medication (s) are on the active medication list -no  Future visit scheduled -yes  Last refill: 2 years  Notes to clinic: Request RF: medication no longer on current medication list  Requested Prescriptions  Pending Prescriptions Disp Refills   lisinopril (ZESTRIL) 10 MG tablet [Pharmacy Med Name: LISINOPRIL 10MG  TABLETS] 135 tablet 1    Sig: TAKE 1 AND 1/2 TABLETS(15 MG) BY MOUTH DAILY     Cardiovascular:  ACE Inhibitors Passed - 02/17/2021  6:33 PM      Passed - Cr in normal range and within 180 days    Creatinine, Ser  Date Value Ref Range Status  12/10/2020 1.12 0.76 - 1.27 mg/dL Final          Passed - K in normal range and within 180 days    Potassium  Date Value Ref Range Status  12/10/2020 4.7 3.5 - 5.2 mmol/L Final          Passed - Patient is not pregnant      Passed - Last BP in normal range    BP Readings from Last 1 Encounters:  12/10/20 117/80          Passed - Valid encounter within last 6 months    Recent Outpatient Visits           2 months ago Type 2 diabetes mellitus with diabetic nephropathy, with long-term current use of insulin ([place])   [place], Megan P, DO   8 months ago Chronic cough   [place], [place], DO   1 year ago Onychomycosis   [place], [place], DO   1 year ago Benign hypertensive renal disease   [facility] [place], [place], DO   2 years ago [place], [place], DO       Future Appointments             In 3 weeks Johnson, Megan P, DO [place], [facility]               Requested Prescriptions  Pending Prescriptions Disp Refills   lisinopril (ZESTRIL) 10 MG tablet [Pharmacy Med Name: LISINOPRIL 10MG  TABLETS] 135 tablet 1    Sig: TAKE 1 AND 1/2 TABLETS(15 MG) BY MOUTH DAILY     Cardiovascular:  ACE Inhibitors Passed -  02/17/2021  6:33 PM      Passed - Cr in normal range and within 180 days    Creatinine, Ser  Date Value Ref Range Status  12/10/2020 1.12 0.76 - 1.27 mg/dL Final          Passed - K in normal range and within 180 days    Potassium  Date Value Ref Range Status  12/10/2020 4.7 3.5 - 5.2 mmol/L Final          Passed - Patient is not pregnant      Passed - Last BP in normal range    BP Readings from Last 1 Encounters:  12/10/20 117/80          Passed - Valid encounter within last 6 months    Recent Outpatient Visits           2 months ago Type 2 diabetes mellitus with diabetic nephropathy, with long-term current use of insulin ([place])   [facility] [place], Megan P, DO   8 months ago Chronic cough  [place], [place], DO   1 year ago Onychomycosis   [facility] Myles Gip, DO   1 year ago Benign hypertensive renal disease   [facility] [place], [place], DO   2 years ago [place], Emet, DO       Future Appointments             In 3 weeks Wynetta Emery, Barb Merino, DO [employer], [facility]

## 2021-02-19 NOTE — Telephone Encounter (Signed)
Patient advised that he discuss medication with PCP at his upcoming appointment on 3/7.

## 2021-03-11 ENCOUNTER — Encounter: Payer: Self-pay | Admitting: Family Medicine

## 2021-03-11 ENCOUNTER — Ambulatory Visit (INDEPENDENT_AMBULATORY_CARE_PROVIDER_SITE_OTHER): Payer: 59 | Admitting: Family Medicine

## 2021-03-11 ENCOUNTER — Other Ambulatory Visit: Payer: Self-pay

## 2021-03-11 VITALS — BP 110/78 | HR 90 | Temp 98.5°F | Wt 143.2 lb

## 2021-03-11 DIAGNOSIS — Z794 Long term (current) use of insulin: Secondary | ICD-10-CM

## 2021-03-11 DIAGNOSIS — E1121 Type 2 diabetes mellitus with diabetic nephropathy: Secondary | ICD-10-CM | POA: Diagnosis not present

## 2021-03-11 DIAGNOSIS — J300 Vasomotor rhinitis: Secondary | ICD-10-CM

## 2021-03-11 DIAGNOSIS — K219 Gastro-esophageal reflux disease without esophagitis: Secondary | ICD-10-CM

## 2021-03-11 LAB — BAYER DCA HB A1C WAIVED: HB A1C (BAYER DCA - WAIVED): 8.7 % — ABNORMAL HIGH (ref 4.8–5.6)

## 2021-03-11 MED ORDER — METFORMIN HCL 1000 MG PO TABS: 1000.0000 mg | ORAL_TABLET | Freq: Two times a day (BID) | ORAL | 1 refills | Status: DC

## 2021-03-11 MED ORDER — OZEMPIC (1 MG/DOSE) 4 MG/3ML ~~LOC~~ SOPN: PEN_INJECTOR | SUBCUTANEOUS | 2 refills | Status: DC

## 2021-03-11 MED ORDER — OMEPRAZOLE 20 MG PO CPDR: DELAYED_RELEASE_CAPSULE | ORAL | 1 refills | Status: DC

## 2021-03-11 MED ORDER — IPRATROPIUM BROMIDE 0.06 % NA SOLN: 2.0000 | Freq: Four times a day (QID) | NASAL | 12 refills | Status: DC

## 2021-03-11 MED ORDER — TRESIBA FLEXTOUCH 200 UNIT/ML ~~LOC~~ SOPN: PEN_INJECTOR | SUBCUTANEOUS | 1 refills | Status: DC

## 2021-03-11 NOTE — Assessment & Plan Note (Signed)
Improved with A1c of 8.7 down from 8.9- missed several doses. Will continue current regimen and recheck 3 months. Call with any concerns.  ?

## 2021-03-11 NOTE — Assessment & Plan Note (Signed)
Will restart omeprazole. Call with any concerns.  ?

## 2021-03-11 NOTE — Progress Notes (Signed)
? ?BP 110/78   Pulse 90   Temp 98.5 ?F (36.9 ?C)   Wt 143 lb 3.2 oz (65 kg)   SpO2 97%   BMI 22.43 kg/m?   ? ?Subjective:  ? ? Patient ID: Arthur [name], male    DOB: [birth year], 56 y.o.   MRN: [id number] ? ?HPI: ?Arthur [name] is a 56 y.o. male ? ?Chief Complaint  ?Patient presents with  ? Diabetes  ? Gastroesophageal Reflux  ?  Patient states he has had bad indigestion and gas, has changed certain food in his diet and feels better but not completely better.  ? ?DIABETES- missed a few weeks because he was sick and didn't have his ozempic ?Hypoglycemic episodes:no ?Polydipsia/polyuria: no ?Visual disturbance: no ?Chest pain: no ?Paresthesias: no ?Glucose Monitoring: yes ? Accucheck frequency:  occasionally ?Taking Insulin?: yes ?Blood Pressure Monitoring: not checking ?Retinal Examination: Up to Date ?Foot Exam: Up to Date ?Diabetic Education: Completed ?Pneumovax: Up to Date ?Influenza: Up to Date ?Aspirin: yes ? ?GERD ?GERD control status: uncontrolled ?Satisfied with current treatment? no ?Heartburn frequency: often ?Medication side effects: no  ?Medication compliance: poor ?Dysphagia: no ?Odynophagia:  no ?Hematemesis: no ?Blood in stool: no ?EGD: no ? ? ?Relevant past medical, surgical, family and social history reviewed and updated as indicated. Interim medical history since our last visit reviewed. ?Allergies and medications reviewed and updated. ? ?Review of Systems  ?Constitutional: Negative.   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 Negative for congestion, dental problem, drooling, ear discharge, ear pain, facial swelling, hearing loss, mouth sores, nosebleeds, postnasal drip, sinus pressure, sinus pain, sneezing, sore throat, tinnitus and trouble swallowing.   ?Respiratory: Negative.    ?Cardiovascular: Negative.   ?Musculoskeletal: Negative.   ?Psychiatric/Behavioral: Negative.    ? ?Per HPI unless specifically indicated above ? ?   ?Objective:  ?  ?BP 110/78   Pulse 90   Temp 98.5  ?F (36.9 ?C)   Wt 143 lb 3.2 oz (65 kg)   SpO2 97%   BMI 22.43 kg/m?   ?Wt Readings from Last 3 Encounters:  ?03/11/21 143 lb 3.2 oz (65 kg)  ?12/10/20 150 lb 3.2 oz (68.1 kg)  ?12/13/19 163 lb 8 oz (74.2 kg)  ?  ?Physical Exam ?Vitals and nursing note reviewed.  ?Constitutional:   ?   General: He is not in acute distress. ?   Appearance: Normal appearance. He is not ill-appearing, toxic-appearing or diaphoretic.  ?HENT:  ?   Head: Normocephalic and atraumatic.  ?   Right Ear: External ear normal.  ?   Left Ear: External ear normal.  ?   Nose: Nose normal.  ?   Mouth/Throat:  ?   Mouth: Mucous membranes are moist.  ?   Pharynx: Oropharynx is clear.  ?Eyes:  ?   General: No scleral icterus.    ?   Right eye: No discharge.     ?   Left eye: No discharge.  ?   Extraocular Movements: Extraocular movements intact.  ?   Conjunctiva/sclera: Conjunctivae normal.  ?   Pupils: Pupils are equal, round, and reactive to light.  ?Cardiovascular:  ?   Rate and Rhythm: Normal rate and regular rhythm.  ?   Pulses: Normal pulses.  ?   Heart sounds: Normal heart sounds. No murmur heard. ?  No friction rub. No gallop.  ?Pulmonary:  ?   Effort: Pulmonary effort is normal. No respiratory distress.  ?   Breath sounds: Normal breath sounds. No  stridor. No wheezing, rhonchi or rales.  ?Chest:  ?   Chest wall: No tenderness.  ?Musculoskeletal:     ?   General: Normal range of motion.  ?   Cervical back: Normal range of motion and neck supple.  ?Skin: ?   General: Skin is warm and dry.  ?   Capillary Refill: Capillary refill takes less than 2 seconds.  ?   Coloration: Skin is not jaundiced or pale.  ?   Findings: No bruising, erythema, lesion or rash.  ?Neurological:  ?   General: No focal deficit present.  ?   Mental Status: He is alert and oriented to person, place, and time. Mental status is at baseline.  ?Psychiatric:     ?   Mood and Affect: Mood normal.     ?   Behavior: Behavior normal.     ?   Thought Content: Thought content  normal.     ?   Judgment: Judgment normal.  ? ? ?Results for orders placed or performed in visit on 03/11/21  ?Bayer DCA Hb A1c Waived  ?Result Value Ref Range  ? HB A1C (BAYER DCA - WAIVED) 8.7 (H) 4.8 - 5.6 %  ? ?   ?Assessment & Plan:  ? ?Problem List Items Addressed This Visit   ? ?  ? Digestive  ? Gastroesophageal reflux disease without esophagitis  ?  Will restart omeprazole. Call with any concerns.  ?  ?  ? Relevant Medications  ? omeprazole (PRILOSEC) 20 MG capsule  ?  ? Endocrine  ? Type 2 diabetes mellitus with renal manifestations (HCC) - Primary  ?  Improved with A1c of 8.7 down from 8.9- missed several doses. Will continue current regimen and recheck 3 months. Call with any concerns.  ?  ?  ? Relevant Medications  ? insulin degludec (TRESIBA FLEXTOUCH) 200 UNIT/ML FlexTouch Pen  ? metFORMIN (GLUCOPHAGE) 1000 MG tablet  ? Semaglutide, 1 MG/DOSE, (OZEMPIC, 1 MG/DOSE,) 4 MG/3ML SOPN  ? Other Relevant Orders  ? Bayer DCA Hb A1c Waived (Completed)  ? ?Other Visit Diagnoses   ? ? Vasomotor rhinitis      ? Continue flonase. Will start atrovent.   ? ?  ?  ? ?Follow up plan: ?Return in about 3 months (around 06/11/2021). ? ? ? ? ? ?

## 2021-05-19 ENCOUNTER — Other Ambulatory Visit: Payer: Self-pay | Admitting: Family Medicine

## 2021-05-21 NOTE — Telephone Encounter (Signed)
Requested Prescriptions  ?Pending Prescriptions Disp Refills  ?? losartan (COZAAR) 25 MG tablet [Pharmacy Med Name: LOSARTAN '25MG'$  TABLETS] 45 tablet 0  ?  Sig: TAKE 1/2 TABLET(12.5 MG) BY MOUTH DAILY  ?  ? Cardiovascular:  Angiotensin Receptor Blockers Passed - 05/19/2021  5:49 PM  ?  ?  Passed - Cr in normal range and within 180 days  ?  Creatinine, Ser  ?Date Value Ref Range Status  ?12/10/2020 1.12 0.76 - 1.27 mg/dL Final  ?   ?  ?  Passed - K in normal range and within 180 days  ?  Potassium  ?Date Value Ref Range Status  ?12/10/2020 4.7 3.5 - 5.2 mmol/L Final  ?   ?  ?  Passed - Patient is not pregnant  ?  ?  Passed - Last BP in normal range  ?  BP Readings from Last 1 Encounters:  ?03/11/21 110/78  ?   ?  ?  Passed - Valid encounter within last 6 months  ?  Recent Outpatient Visits   ?      ? 2 months ago Type 2 diabetes mellitus with diabetic nephropathy, with long-term current use of insulin ([place])  ? [place], Megan P, DO  ? 5 months ago Type 2 diabetes mellitus with diabetic nephropathy, with long-term current use of insulin ([place])  ? [place], Megan P, DO  ? 11 months ago Chronic cough  ? [place], [place] P, DO  ? 1 year ago Onychomycosis  ? [place], DO  ? 1 year ago Benign hypertensive renal disease  ? [place], [place] P, DO  ?  ?  ?Future Appointments   ?        ? In 2 weeks Wynetta Emery, Barb Merino, DO [place], [facility]  ?  ? ?  ?  ?  ? ? ?

## 2021-06-10 ENCOUNTER — Ambulatory Visit: Payer: 59 | Admitting: Physician Assistant

## 2021-06-10 ENCOUNTER — Ambulatory Visit: Payer: 59 | Admitting: Family Medicine

## 2021-07-09 ENCOUNTER — Other Ambulatory Visit: Payer: Self-pay | Admitting: Family Medicine

## 2021-07-10 NOTE — Telephone Encounter (Signed)
Refused losartan 25 mg because labs are due per protocol.  Provider to review for refills.

## 2021-08-13 LAB — HM DIABETES EYE EXAM

## 2021-08-25 ENCOUNTER — Other Ambulatory Visit: Payer: Self-pay | Admitting: Family Medicine

## 2021-08-26 NOTE — Telephone Encounter (Signed)
Requested Prescriptions  Pending Prescriptions Disp Refills  . losartan (COZAAR) 25 MG tablet [Pharmacy Med Name: LOSARTAN $RemoveBefore'25MG'[id number]$  TABLETS] 45 tablet 0    Sig: TAKE 1/2 TABLET(12.5 MG) BY MOUTH DAILY     Cardiovascular:  Angiotensin Receptor Blockers Failed - 08/25/2021  1:56 PM      Failed - Cr in normal range and within 180 days    Creatinine, Ser  Date Value Ref Range Status  12/10/2020 1.12 0.76 - 1.27 mg/dL Final         Failed - K in normal range and within 180 days    Potassium  Date Value Ref Range Status  12/10/2020 4.7 3.5 - 5.2 mmol/L Final         Passed - Patient is not pregnant      Passed - Last BP in normal range    BP Readings from Last 1 Encounters:  03/11/21 110/78         Passed - Valid encounter within last 6 months    Recent Outpatient Visits          5 months ago Type 2 diabetes mellitus with diabetic nephropathy, with long-term current use of insulin ([place])   [place], Megan P, DO   8 months ago Type 2 diabetes mellitus with diabetic nephropathy, with long-term current use of insulin ([place])   [place], [place], DO   1 year ago Chronic cough   [place], [place], DO   1 year ago Onychomycosis   [place] Myles Gip, DO   1 year ago Benign hypertensive renal disease   [facility] Johnson, Megan P, DO             . ASPIRIN LOW DOSE 81 MG tablet [Pharmacy Med Name: ASPIRIN $RemoveBefor'81MG'[id number]$  EC LOW DOSE TABLETS] 90 tablet 0    Sig: TAKE 1 TABLET(81 MG) BY MOUTH DAILY     Analgesics:  NSAIDS - aspirin Passed - 08/25/2021  1:56 PM      Passed - Cr in normal range and within 360 days    Creatinine, Ser  Date Value Ref Range Status  12/10/2020 1.12 0.76 - 1.27 mg/dL Final         Passed - eGFR is 10 or above and within 360 days    GFR calc Af Amer  Date Value Ref Range Status  10/03/2019 75 >59 mL/min/1.73 Final    Comment:    **[facility] currently reports  eGFR in compliance with the current**   recommendations of the [employer]. [facility] will   update reporting as new guidelines are published from the NKF-ASN   Task force.    GFR calc non Af Amer  Date Value Ref Range Status  10/03/2019 65 >59 mL/min/1.73 Final   eGFR  Date Value Ref Range Status  12/10/2020 77 >59 mL/min/1.73 Final         Passed - Patient is not pregnant      Passed - Valid encounter within last 12 months    Recent Outpatient Visits          5 months ago Type 2 diabetes mellitus with diabetic nephropathy, with long-term current use of insulin ([place])   [place], Megan P, DO   8 months ago Type 2 diabetes mellitus with diabetic nephropathy, with long-term current use of insulin ([place])   [place], Megan P, DO   1 year ago Chronic  cough   [place], [place], [place]   1 year ago Onychomycosis   [place], DO   1 year ago Benign hypertensive renal disease   [facility] [place], [place], DO

## 2021-08-29 ENCOUNTER — Encounter: Payer: Self-pay | Admitting: Family Medicine

## 2021-09-29 ENCOUNTER — Other Ambulatory Visit: Payer: Self-pay | Admitting: Family Medicine

## 2021-09-30 NOTE — Telephone Encounter (Signed)
Courtesy refill, tried to call pt, unable to leave VM d/t mailbox full.   Requested Prescriptions  Pending Prescriptions Disp Refills  . Semaglutide, 1 MG/DOSE, (OZEMPIC, 1 MG/DOSE,) 4 MG/3ML SOPN [Pharmacy Med Name: OZEMPIC '1MG'$  PER DOSE (1X'4MG'$  PEN)] 3 mL 2    Sig: INJECT 1 MG AS DIRECTED WEEKLY     Endocrinology:  Diabetes - GLP-1 Receptor Agonists - semaglutide Failed - 09/29/2021  2:40 PM      Failed - HBA1C in normal range and within 180 days    Hemoglobin A1C  Date Value Ref Range Status  08/10/2016 9.7  Final   HB A1C (BAYER DCA - WAIVED)  Date Value Ref Range Status  03/11/2021 8.7 (H) 4.8 - 5.6 % Final    Comment:             Prediabetes: 5.7 - 6.4          Diabetes: >6.4          Glycemic control for adults with diabetes: <7.0          Failed - Valid encounter within last 6 months    Recent Outpatient Visits          6 months ago Type 2 diabetes mellitus with diabetic nephropathy, with long-term current use of insulin ([place])   [place], Megan P, DO   9 months ago Type 2 diabetes mellitus with diabetic nephropathy, with long-term current use of insulin ([place])   [place], [place], DO   1 year ago Chronic cough   [place], [place], DO   1 year ago Onychomycosis   [place], Alison M, DO   1 year ago Benign hypertensive renal disease   [facility] Johnson, Megan P, DO             Passed - Cr in normal range and within 360 days    Creatinine, Ser  Date Value Ref Range Status  12/10/2020 1.12 0.76 - 1.27 mg/dL Final

## 2021-11-17 ENCOUNTER — Other Ambulatory Visit: Payer: Self-pay | Admitting: Family Medicine

## 2021-11-18 ENCOUNTER — Other Ambulatory Visit: Payer: Self-pay | Admitting: Family Medicine

## 2021-11-18 ENCOUNTER — Other Ambulatory Visit: Payer: Self-pay

## 2021-11-18 NOTE — Addendum Note (Signed)
Addended by: Mariel Sleet E on: 11/18/2021 01:32 PM   Modules accepted: Orders

## 2021-11-18 NOTE — Telephone Encounter (Signed)
Courtesy fill- keep appointment Requested Prescriptions  Pending Prescriptions Disp Refills   atorvastatin (LIPITOR) 40 MG tablet [Pharmacy Med Name: ATORVASTATIN 40MG TABLETS] 180 tablet     Sig: TAKE 2 TABLETS(80 MG) BY MOUTH DAILY     Cardiovascular:  Antilipid - Statins Failed - 11/18/2021  4:11 PM      Failed - Lipid Panel in normal range within the last 12 months    Cholesterol, Total  Date Value Ref Range Status  12/10/2020 176 100 - 199 mg/dL Final   LDL Chol Calc (NIH)  Date Value Ref Range Status  12/10/2020 101 (H) 0 - 99 mg/dL Final   HDL  Date Value Ref Range Status  12/10/2020 37 (L) >39 mg/dL Final   Triglycerides  Date Value Ref Range Status  12/10/2020 222 (H) 0 - 149 mg/dL Final         Passed - Patient is not pregnant      Passed - Valid encounter within last 12 months    Recent Outpatient Visits           8 months ago Type 2 diabetes mellitus with diabetic nephropathy, with long-term current use of insulin ([place])   [place], Megan P, DO   11 months ago Type 2 diabetes mellitus with diabetic nephropathy, with long-term current use of insulin ([place])   [place], [place], DO   1 year ago Chronic cough   [place], [place], DO   1 year ago Onychomycosis   [place], [place], DO   2 years ago Benign hypertensive renal disease   [facility] [place], Megan P, DO       Future Appointments             In 1 week Johnson, Megan P, DO [facility], [facility]             losartan (COZAAR) 25 MG tablet [employer] Med Name: LOSARTAN 25MG TABLETS] 45 tablet     Sig: TAKE 1/2 TABLET(12.5 MG) BY MOUTH DAILY     Cardiovascular:  Angiotensin Receptor Blockers Failed - 11/18/2021  4:11 PM      Failed - Cr in normal range and within 180 days    Creatinine, Ser  Date Value Ref Range Status  12/10/2020 1.12 0.76 - 1.27 mg/dL Final         Failed  - K in normal range and within 180 days    Potassium  Date Value Ref Range Status  12/10/2020 4.7 3.5 - 5.2 mmol/L Final         Failed - Valid encounter within last 6 months    Recent Outpatient Visits           8 months ago Type 2 diabetes mellitus with diabetic nephropathy, with long-term current use of insulin ([place])   [place], Megan P, DO   11 months ago Type 2 diabetes mellitus with diabetic nephropathy, with long-term current use of insulin ([place])   [place], [place], DO   1 year ago Chronic cough   [place], [place], DO   1 year ago Onychomycosis   [place], [place], DO   2 years ago Benign hypertensive renal disease   [facility] [place], Megan P, DO       Future Appointments             In  1 week Wynetta Emery, Megan P, DO [place], [place] - Patient is not pregnant      Passed - Last BP in normal range    BP Readings from Last 1 Encounters:  03/11/21 110/78          metFORMIN (GLUCOPHAGE) 1000 MG tablet [Pharmacy Med Name: METFORMIN 1000MG TABLETS] 180 tablet     Sig: TAKE 1 TABLET(1000 MG) BY MOUTH TWICE DAILY     Endocrinology:  Diabetes - Biguanides Failed - 11/18/2021  4:11 PM      Failed - HBA1C is between 0 and 7.9 and within 180 days    Hemoglobin A1C  Date Value Ref Range Status  08/10/2016 9.7  Final   HB A1C (BAYER DCA - WAIVED)  Date Value Ref Range Status  03/11/2021 8.7 (H) 4.8 - 5.6 % Final    Comment:             Prediabetes: 5.7 - 6.4          Diabetes: >6.4          Glycemic control for adults with diabetes: <7.0          Failed - B12 Level in normal range and within 720 days    No results found for: "VITAMINB12"       Failed - Valid encounter within last 6 months    Recent Outpatient Visits           8 months ago Type 2 diabetes mellitus with diabetic nephropathy, with long-term current use  of insulin ([place])   [place], Megan P, DO   11 months ago Type 2 diabetes mellitus with diabetic nephropathy, with long-term current use of insulin ([place])   [place], [place], DO   1 year ago Chronic cough   [place], [place], DO   1 year ago Onychomycosis   [place], [place], DO   2 years ago Benign hypertensive renal disease   [facility] [place], Megan P, DO       Future Appointments             In 1 week Johnson, Megan P, DO [facility], [facility]            Passed - Cr in normal range and within 360 days    Creatinine, Ser  Date Value Ref Range Status  12/10/2020 1.12 0.76 - 1.27 mg/dL Final         Passed - eGFR in normal range and within 360 days    GFR calc Af Amer  Date Value Ref Range Status  10/03/2019 75 >59 mL/min/1.73 Final    Comment:    **[facility] currently reports eGFR in compliance with the current**   recommendations of the [employer]. [facility] will   update reporting as new guidelines are published from the NKF-ASN   Task force.    GFR calc non Af Amer  Date Value Ref Range Status  10/03/2019 65 >59 mL/min/1.73 Final   eGFR  Date Value Ref Range Status  12/10/2020 77 >59 mL/min/1.73 Final         Passed - CBC within normal limits and completed in the last 12 months    WBC  Date Value Ref Range Status  12/10/2020 8.1 3.4 - 10.8 x10E3/uL Final  01/30/2019 6.3 4.0 - 10.5 K/uL Final  RBC  Date Value Ref Range Status  12/10/2020 5.34 4.14 - 5.80 x10E6/uL Final  01/30/2019 4.68 4.22 - 5.81 MIL/uL Final   Hemoglobin  Date Value Ref Range Status  12/10/2020 14.3 13.0 - 17.7 g/dL Final   Hematocrit  Date Value Ref Range Status  12/10/2020 44.8 37.5 - 51.0 % Final   MCHC  Date Value Ref Range Status  12/10/2020 31.9 31.5 - 35.7 g/dL Final  01/30/2019 32.4 30.0 - 36.0 g/dL Final   [facility]  Date Value Ref Range  Status  12/10/2020 26.8 26.6 - 33.0 pg Final  01/30/2019 26.1 26.0 - 34.0 pg Final   MCV  Date Value Ref Range Status  12/10/2020 84 79 - 97 fL Final   No results found for: "PLTCOUNTKUC", "LABPLAT", "POCPLA" RDW  Date Value Ref Range Status  12/10/2020 13.1 11.6 - 15.4 % Final

## 2021-11-18 NOTE — Telephone Encounter (Signed)
Requested Prescriptions  Pending Prescriptions Disp Refills   metFORMIN (GLUCOPHAGE) 1000 MG tablet [Pharmacy Med Name: METFORMIN 1000MG TABLETS] 180 tablet     Sig: TAKE 1 TABLET(1000 MG) BY MOUTH TWICE DAILY     Endocrinology:  Diabetes - Biguanides Failed - 11/17/2021  3:58 PM      Failed - HBA1C is between 0 and 7.9 and within 180 days    Hemoglobin A1C  Date Value Ref Range Status  08/10/2016 9.7  Final   HB A1C (BAYER DCA - WAIVED)  Date Value Ref Range Status  03/11/2021 8.7 (H) 4.8 - 5.6 % Final    Comment:             Prediabetes: 5.7 - 6.4          Diabetes: >6.4          Glycemic control for adults with diabetes: <7.0          Failed - B12 Level in normal range and within 720 days    No results found for: "VITAMINB12"       Failed - Valid encounter within last 6 months    Recent Outpatient Visits           8 months ago Type 2 diabetes mellitus with diabetic nephropathy, with long-term current use of insulin ([place])   [place], Megan P, DO   11 months ago Type 2 diabetes mellitus with diabetic nephropathy, with long-term current use of insulin ([place])   [place], [place], DO   1 year ago Chronic cough   [place], [place], DO   1 year ago Onychomycosis   [place], [place], DO   2 years ago Benign hypertensive renal disease   [facility] [place], Megan P, DO       Future Appointments             In 1 week Johnson, Megan P, DO [facility], [facility]            Passed - Cr in normal range and within 360 days    Creatinine, Ser  Date Value Ref Range Status  12/10/2020 1.12 0.76 - 1.27 mg/dL Final         Passed - eGFR in normal range and within 360 days    GFR calc Af Amer  Date Value Ref Range Status  10/03/2019 75 >59 mL/min/1.73 Final    Comment:    **[facility] currently reports eGFR in compliance with the current**   recommendations of  the [employer]. [facility] will   update reporting as new guidelines are published from the NKF-ASN   Task force.    GFR calc non Af Amer  Date Value Ref Range Status  10/03/2019 65 >59 mL/min/1.73 Final   eGFR  Date Value Ref Range Status  12/10/2020 77 >59 mL/min/1.73 Final         Passed - CBC within normal limits and completed in the last 12 months    WBC  Date Value Ref Range Status  12/10/2020 8.1 3.4 - 10.8 x10E3/uL Final  01/30/2019 6.3 4.0 - 10.5 K/uL Final   RBC  Date Value Ref Range Status  12/10/2020 5.34 4.14 - 5.80 x10E6/uL Final  01/30/2019 4.68 4.22 - 5.81 MIL/uL Final   Hemoglobin  Date Value Ref Range Status  12/10/2020 14.3 13.0 - 17.7 g/dL Final   Hematocrit  Date Value Ref Range Status  12/10/2020  44.8 37.5 - 51.0 % Final   MCHC  Date Value Ref Range Status  12/10/2020 31.9 31.5 - 35.7 g/dL Final  01/30/2019 32.4 30.0 - 36.0 g/dL Final   [facility]  Date Value Ref Range Status  12/10/2020 26.8 26.6 - 33.0 pg Final  01/30/2019 26.1 26.0 - 34.0 pg Final   MCV  Date Value Ref Range Status  12/10/2020 84 79 - 97 fL Final   No results found for: "PLTCOUNTKUC", "LABPLAT", "POCPLA" RDW  Date Value Ref Range Status  12/10/2020 13.1 11.6 - 15.4 % Final          Semaglutide, 1 MG/DOSE, (OZEMPIC, 1 MG/DOSE,) 4 MG/3ML SOPN [Pharmacy Med Name: OZEMPIC 1MG PER DOSE (1X4MG PEN)] 3 mL     Sig: INJECT 1 MG UNDER THE SKIN ONCE A WEEK     Endocrinology:  Diabetes - GLP-1 Receptor Agonists - semaglutide Failed - 11/17/2021  3:58 PM      Failed - HBA1C in normal range and within 180 days    Hemoglobin A1C  Date Value Ref Range Status  08/10/2016 9.7  Final   HB A1C (BAYER DCA - WAIVED)  Date Value Ref Range Status  03/11/2021 8.7 (H) 4.8 - 5.6 % Final    Comment:             Prediabetes: 5.7 - 6.4          Diabetes: >6.4          Glycemic control for adults with diabetes: <7.0          Failed - Valid encounter within last 6 months     Recent Outpatient Visits           8 months ago Type 2 diabetes mellitus with diabetic nephropathy, with long-term current use of insulin ([place])   [place], Megan P, DO   11 months ago Type 2 diabetes mellitus with diabetic nephropathy, with long-term current use of insulin ([place])   [place], [place], DO   1 year ago Chronic cough   [place], [place], DO   1 year ago Onychomycosis   [place], [place], DO   2 years ago Benign hypertensive renal disease   [facility] [place], Megan P, DO       Future Appointments             In 1 week Johnson, Megan P, DO [place], [facility]            Passed - Cr in normal range and within 360 days    Creatinine, Ser  Date Value Ref Range Status  12/10/2020 1.12 0.76 - 1.27 mg/dL Final          losartan (COZAAR) 25 MG tablet [Pharmacy Med Name: LOSARTAN 25MG TABLETS] 45 tablet     Sig: TAKE 1/2 TABLET(12.5 MG) BY MOUTH DAILY     Cardiovascular:  Angiotensin Receptor Blockers Failed - 11/17/2021  3:58 PM      Failed - Cr in normal range and within 180 days    Creatinine, Ser  Date Value Ref Range Status  12/10/2020 1.12 0.76 - 1.27 mg/dL Final         Failed - K in normal range and within 180 days    Potassium  Date Value Ref Range Status  12/10/2020 4.7 3.5 - 5.2 mmol/L Final         Failed - Valid encounter within  last 6 months    Recent Outpatient Visits           8 months ago Type 2 diabetes mellitus with diabetic nephropathy, with long-term current use of insulin ([place])   [place], Megan P, DO   11 months ago Type 2 diabetes mellitus with diabetic nephropathy, with long-term current use of insulin ([place])   [place], [place], DO   1 year ago Chronic cough   [place], [place], DO   1 year ago Onychomycosis   [place], [place], DO   2 years ago Benign hypertensive renal disease   [facility] [place], Megan P, DO       Future Appointments             In 1 week Wynetta Emery, Megan P, DO [place], [place] - Patient is not pregnant      Passed - Last BP in normal range    BP Readings from Last 1 Encounters:  03/11/21 110/78          ASPIRIN LOW DOSE 81 MG tablet [Pharmacy Med Name: ASPIRIN 81MG EC LOW DOSE TABLETS] 90 tablet 0    Sig: TAKE 1 TABLET(81 MG) BY MOUTH DAILY     Analgesics:  NSAIDS - aspirin Passed - 11/17/2021  3:58 PM      Passed - Cr in normal range and within 360 days    Creatinine, Ser  Date Value Ref Range Status  12/10/2020 1.12 0.76 - 1.27 mg/dL Final         Passed - eGFR is 10 or above and within 360 days    GFR calc Af Amer  Date Value Ref Range Status  10/03/2019 75 >59 mL/min/1.73 Final    Comment:    **[facility] currently reports eGFR in compliance with the current**   recommendations of the [employer]. [facility] will   update reporting as new guidelines are published from the NKF-ASN   Task force.    GFR calc non Af Amer  Date Value Ref Range Status  10/03/2019 65 >59 mL/min/1.73 Final   eGFR  Date Value Ref Range Status  12/10/2020 77 >59 mL/min/1.73 Final         Passed - Patient is not pregnant      Passed - Valid encounter within last 12 months    Recent Outpatient Visits           8 months ago Type 2 diabetes mellitus with diabetic nephropathy, with long-term current use of insulin ([place])   [place], Megan P, DO   11 months ago Type 2 diabetes mellitus with diabetic nephropathy, with long-term current use of insulin ([place])   [place], [place], DO   1 year ago Chronic cough   [place], [place], DO   1 year ago Onychomycosis   [place], [place], DO   2 years ago Benign hypertensive  renal disease   [facility] [place], [place], DO       Future Appointments             In 1 week Johnson, Megan P, DO [place]             atorvastatin (LIPITOR) 40 MG tablet [Pharmacy Med Name: ATORVASTATIN 40MG TABLETS] 180 tablet  Sig: TAKE 2 TABLETS(80 MG) BY MOUTH DAILY     Cardiovascular:  Antilipid - Statins Failed - 11/17/2021  3:58 PM      Failed - Lipid Panel in normal range within the last 12 months    Cholesterol, Total  Date Value Ref Range Status  12/10/2020 176 100 - 199 mg/dL Final   LDL Chol Calc (NIH)  Date Value Ref Range Status  12/10/2020 101 (H) 0 - 99 mg/dL Final   HDL  Date Value Ref Range Status  12/10/2020 37 (L) >39 mg/dL Final   Triglycerides  Date Value Ref Range Status  12/10/2020 222 (H) 0 - 149 mg/dL Final         Passed - Patient is not pregnant      Passed - Valid encounter within last 12 months    Recent Outpatient Visits           8 months ago Type 2 diabetes mellitus with diabetic nephropathy, with long-term current use of insulin ([place])   [place], Megan P, DO   11 months ago Type 2 diabetes mellitus with diabetic nephropathy, with long-term current use of insulin ([place])   [place], [place], DO   1 year ago Chronic cough   [place], [place], DO   1 year ago Onychomycosis   [place], [place], DO   2 years ago Benign hypertensive renal disease   [facility] [place], Barb Merino, DO       Future Appointments             In 1 week Wynetta Emery, Barb Merino, DO [facility], [facility]

## 2021-11-18 NOTE — Telephone Encounter (Signed)
Called pt and appt made for refill. Pt asked for refill of Atrovent nasal spray.

## 2021-11-18 NOTE — Telephone Encounter (Signed)
Requested medication (s) are due for refill today: yes  Requested medication (s) are on the active medication list: yes  Last refill:  Metformin: 3/7/233 #180 1 RF                    Ozempic: 09/30/21 3 ml                   Losartan: 08/26/21 #45                   Atorvastatin: 07/10/21 #180  Future visit scheduled: yes  Notes to clinic:  overdue labs and appt Pt is out of Ozempic and Metformin  Requested Prescriptions  Pending Prescriptions Disp Refills   metFORMIN (GLUCOPHAGE) 1000 MG tablet [Pharmacy Med Name: METFORMIN 1000MG TABLETS] 180 tablet     Sig: TAKE 1 TABLET(1000 MG) BY MOUTH TWICE DAILY     Endocrinology:  Diabetes - Biguanides Failed - 11/17/2021  3:58 PM      Failed - HBA1C is between 0 and 7.9 and within 180 days    Hemoglobin A1C  Date Value Ref Range Status  08/10/2016 9.7  Final   HB A1C (BAYER DCA - WAIVED)  Date Value Ref Range Status  03/11/2021 8.7 (H) 4.8 - 5.6 % Final    Comment:             Prediabetes: 5.7 - 6.4          Diabetes: >6.4          Glycemic control for adults with diabetes: <7.0          Failed - B12 Level in normal range and within 720 days    No results found for: "VITAMINB12"       Failed - Valid encounter within last 6 months    Recent Outpatient Visits           8 months ago Type 2 diabetes mellitus with diabetic nephropathy, with long-term current use of insulin ([place])   [place], Megan P, DO   11 months ago Type 2 diabetes mellitus with diabetic nephropathy, with long-term current use of insulin ([place])   [place], [place], DO   1 year ago Chronic cough   [place], [place], DO   1 year ago Onychomycosis   [place], Alison M, DO   2 years ago Benign hypertensive renal disease   [facility] [place], Megan P, DO       Future Appointments             In 1 week Johnson, Megan P, DO [facility]             Passed - Cr in normal range and within 360 days    Creatinine, Ser  Date Value Ref Range Status  12/10/2020 1.12 0.76 - 1.27 mg/dL Final         Passed - eGFR in normal range and within 360 days    GFR calc Af Amer  Date Value Ref Range Status  10/03/2019 75 >59 mL/min/1.73 Final    Comment:    **[facility] currently reports eGFR in compliance with the current**   recommendations of the [employer]. [facility] will   update reporting as new guidelines are published from the NKF-ASN   Task force.    GFR calc non Af Amer  Date Value Ref Range Status  10/03/2019 65 >59 mL/min/1.73  Final   eGFR  Date Value Ref Range Status  12/10/2020 77 >59 mL/min/1.73 Final         Passed - CBC within normal limits and completed in the last 12 months    WBC  Date Value Ref Range Status  12/10/2020 8.1 3.4 - 10.8 x10E3/uL Final  01/30/2019 6.3 4.0 - 10.5 K/uL Final   RBC  Date Value Ref Range Status  12/10/2020 5.34 4.14 - 5.80 x10E6/uL Final  01/30/2019 4.68 4.22 - 5.81 MIL/uL Final   Hemoglobin  Date Value Ref Range Status  12/10/2020 14.3 13.0 - 17.7 g/dL Final   Hematocrit  Date Value Ref Range Status  12/10/2020 44.8 37.5 - 51.0 % Final   MCHC  Date Value Ref Range Status  12/10/2020 31.9 31.5 - 35.7 g/dL Final  01/30/2019 32.4 30.0 - 36.0 g/dL Final   [facility]  Date Value Ref Range Status  12/10/2020 26.8 26.6 - 33.0 pg Final  01/30/2019 26.1 26.0 - 34.0 pg Final   MCV  Date Value Ref Range Status  12/10/2020 84 79 - 97 fL Final   No results found for: "PLTCOUNTKUC", "LABPLAT", "POCPLA" RDW  Date Value Ref Range Status  12/10/2020 13.1 11.6 - 15.4 % Final          Semaglutide, 1 MG/DOSE, (OZEMPIC, 1 MG/DOSE,) 4 MG/3ML SOPN [Pharmacy Med Name: OZEMPIC 1MG PER DOSE (1X4MG PEN)] 3 mL     Sig: INJECT 1 MG UNDER THE SKIN ONCE A WEEK     Endocrinology:  Diabetes - GLP-1 Receptor Agonists - semaglutide Failed - 11/17/2021  3:58 PM      Failed -  HBA1C in normal range and within 180 days    Hemoglobin A1C  Date Value Ref Range Status  08/10/2016 9.7  Final   HB A1C (BAYER DCA - WAIVED)  Date Value Ref Range Status  03/11/2021 8.7 (H) 4.8 - 5.6 % Final    Comment:             Prediabetes: 5.7 - 6.4          Diabetes: >6.4          Glycemic control for adults with diabetes: <7.0          Failed - Valid encounter within last 6 months    Recent Outpatient Visits           8 months ago Type 2 diabetes mellitus with diabetic nephropathy, with long-term current use of insulin ([place])   [facility] [employer], Megan P, DO   11 months ago Type 2 diabetes mellitus with diabetic nephropathy, with long-term current use of insulin ([place])   [place], [place], DO   1 year ago Chronic cough   [place], [place], DO   1 year ago Onychomycosis   [place], [place], DO   2 years ago Benign hypertensive renal disease   [facility] [place], Megan P, DO       Future Appointments             In 1 week Johnson, Megan P, DO [facility]            Passed - Cr in normal range and within 360 days    Creatinine, Ser  Date Value Ref Range Status  12/10/2020 1.12 0.76 - 1.27 mg/dL Final          losartan (COZAAR) 25 MG tablet [Pharmacy Med  Name: LOSARTAN 25MG TABLETS] 45 tablet     Sig: TAKE 1/2 TABLET(12.5 MG) BY MOUTH DAILY     Cardiovascular:  Angiotensin Receptor Blockers Failed - 11/17/2021  3:58 PM      Failed - Cr in normal range and within 180 days    Creatinine, Ser  Date Value Ref Range Status  12/10/2020 1.12 0.76 - 1.27 mg/dL Final         Failed - K in normal range and within 180 days    Potassium  Date Value Ref Range Status  12/10/2020 4.7 3.5 - 5.2 mmol/L Final         Failed - Valid encounter within last 6 months    Recent Outpatient Visits           8 months ago Type 2 diabetes mellitus with  diabetic nephropathy, with long-term current use of insulin ([place])   [facility] [place], Megan P, DO   11 months ago Type 2 diabetes mellitus with diabetic nephropathy, with long-term current use of insulin ([place])   [place], [place], DO   1 year ago Chronic cough   [place], [place], DO   1 year ago Onychomycosis   [place], [place], DO   2 years ago Benign hypertensive renal disease   [facility] [place], Megan P, DO       Future Appointments             In 1 week Johnson, Megan P, DO [place], [place] - Patient is not pregnant      Passed - Last BP in normal range    BP Readings from Last 1 Encounters:  03/11/21 110/78          atorvastatin (LIPITOR) 40 MG tablet [Pharmacy Med Name: ATORVASTATIN 40MG TABLETS] 180 tablet     Sig: TAKE 2 TABLETS(80 MG) BY MOUTH DAILY     Cardiovascular:  Antilipid - Statins Failed - 11/17/2021  3:58 PM      Failed - Lipid Panel in normal range within the last 12 months    Cholesterol, Total  Date Value Ref Range Status  12/10/2020 176 100 - 199 mg/dL Final   LDL Chol Calc (NIH)  Date Value Ref Range Status  12/10/2020 101 (H) 0 - 99 mg/dL Final   HDL  Date Value Ref Range Status  12/10/2020 37 (L) >39 mg/dL Final   Triglycerides  Date Value Ref Range Status  12/10/2020 222 (H) 0 - 149 mg/dL Final         Passed - Patient is not pregnant      Passed - Valid encounter within last 12 months    Recent Outpatient Visits           8 months ago Type 2 diabetes mellitus with diabetic nephropathy, with long-term current use of insulin ([place])   [place], Megan P, DO   11 months ago Type 2 diabetes mellitus with diabetic nephropathy, with long-term current use of insulin ([place])   [place], [place], DO   1 year ago Chronic cough   [place], [place], DO   1 year ago Onychomycosis   [facility] Myles Gip, DO   2 years ago Benign hypertensive renal disease   [facility] [place], [place], DO  Future Appointments             In 1 week Johnson, Megan P, DO [facility]            Signed Prescriptions Disp Refills   ASPIRIN LOW DOSE 81 MG tablet 90 tablet 0    Sig: TAKE 1 TABLET(81 MG) BY MOUTH DAILY     Analgesics:  NSAIDS - aspirin Passed - 11/17/2021  3:58 PM      Passed - Cr in normal range and within 360 days    Creatinine, Ser  Date Value Ref Range Status  12/10/2020 1.12 0.76 - 1.27 mg/dL Final         Passed - eGFR is 10 or above and within 360 days    GFR calc Af Amer  Date Value Ref Range Status  10/03/2019 75 >59 mL/min/1.73 Final    Comment:    **[facility] currently reports eGFR in compliance with the current**   recommendations of the [employer]. [facility] will   update reporting as new guidelines are published from the NKF-ASN   Task force.    GFR calc non Af Amer  Date Value Ref Range Status  10/03/2019 65 >59 mL/min/1.73 Final   eGFR  Date Value Ref Range Status  12/10/2020 77 >59 mL/min/1.73 Final         Passed - Patient is not pregnant      Passed - Valid encounter within last 12 months    Recent Outpatient Visits           8 months ago Type 2 diabetes mellitus with diabetic nephropathy, with long-term current use of insulin ([place])   [place], Megan P, DO   11 months ago Type 2 diabetes mellitus with diabetic nephropathy, with long-term current use of insulin ([place])   [place], [place], DO   1 year ago Chronic cough   [place], [place], DO   1 year ago Onychomycosis   [facility] Myles Gip, DO   2 years ago Benign hypertensive renal disease   [facility] [place], Barb Merino, DO       Future  Appointments             In 1 week Wynetta Emery, Barb Merino, DO [facility], [facility]

## 2021-11-18 NOTE — Telephone Encounter (Signed)
Medication requested in another encounter and routed to provider for approval.

## 2021-11-18 NOTE — Telephone Encounter (Signed)
Called [facility] and pt has refills of Atrovent on file. Med is waiting on pt to pick up med. Refused this request.  Last RF: 03/11/21 15 ml 12 RF.

## 2021-11-18 NOTE — Telephone Encounter (Signed)
Please find out if he has enough to get to his appt

## 2021-11-18 NOTE — Addendum Note (Signed)
Addended by: Mariel Sleet E on: 11/18/2021 01:10 PM   Modules accepted: Orders

## 2021-11-25 ENCOUNTER — Ambulatory Visit (INDEPENDENT_AMBULATORY_CARE_PROVIDER_SITE_OTHER): Payer: 59 | Admitting: Family Medicine

## 2021-11-25 ENCOUNTER — Encounter: Payer: Self-pay | Admitting: Family Medicine

## 2021-11-25 VITALS — BP 113/72 | HR 89 | Temp 98.4°F | Ht 68.0 in | Wt 137.8 lb

## 2021-11-25 DIAGNOSIS — E785 Hyperlipidemia, unspecified: Secondary | ICD-10-CM

## 2021-11-25 DIAGNOSIS — Z794 Long term (current) use of insulin: Secondary | ICD-10-CM

## 2021-11-25 DIAGNOSIS — Z23 Encounter for immunization: Secondary | ICD-10-CM

## 2021-11-25 DIAGNOSIS — N400 Enlarged prostate without lower urinary tract symptoms: Secondary | ICD-10-CM

## 2021-11-25 DIAGNOSIS — K219 Gastro-esophageal reflux disease without esophagitis: Secondary | ICD-10-CM | POA: Diagnosis not present

## 2021-11-25 DIAGNOSIS — I129 Hypertensive chronic kidney disease with stage 1 through stage 4 chronic kidney disease, or unspecified chronic kidney disease: Secondary | ICD-10-CM | POA: Diagnosis not present

## 2021-11-25 DIAGNOSIS — E1121 Type 2 diabetes mellitus with diabetic nephropathy: Secondary | ICD-10-CM | POA: Diagnosis not present

## 2021-11-25 DIAGNOSIS — Z0000 Encounter for general adult medical examination without abnormal findings: Secondary | ICD-10-CM

## 2021-11-25 LAB — URINALYSIS, ROUTINE W REFLEX MICROSCOPIC
Bilirubin, UA: NEGATIVE
Glucose, UA: NEGATIVE
Leukocytes,UA: NEGATIVE
Nitrite, UA: NEGATIVE
RBC, UA: NEGATIVE
Specific Gravity, UA: 1.015 (ref 1.005–1.030)
Urobilinogen, Ur: 0.2 mg/dL (ref 0.2–1.0)
pH, UA: 5 (ref 5.0–7.5)

## 2021-11-25 LAB — MICROALBUMIN, URINE WAIVED
Creatinine, Urine Waived: 100 mg/dL (ref 10–300)
Microalb, Ur Waived: 80 mg/L — ABNORMAL HIGH (ref 0–19)

## 2021-11-25 LAB — MICROSCOPIC EXAMINATION
Bacteria, UA: NONE SEEN
Epithelial Cells (non renal): NONE SEEN /hpf (ref 0–10)
RBC, Urine: NONE SEEN /hpf (ref 0–2)

## 2021-11-25 LAB — BAYER DCA HB A1C WAIVED: HB A1C (BAYER DCA - WAIVED): 12.9 % — ABNORMAL HIGH (ref 4.8–5.6)

## 2021-11-25 MED ORDER — METFORMIN HCL 1000 MG PO TABS: ORAL_TABLET | ORAL | 0 refills | Status: DC

## 2021-11-25 MED ORDER — SILDENAFIL CITRATE 100 MG PO TABS: ORAL_TABLET | ORAL | 12 refills | Status: DC

## 2021-11-25 MED ORDER — TIRZEPATIDE 10 MG/0.5ML ~~LOC~~ SOAJ: 10.0000 mg | SUBCUTANEOUS | 1 refills | Status: DC

## 2021-11-25 MED ORDER — TRESIBA FLEXTOUCH 200 UNIT/ML ~~LOC~~ SOPN: 20.0000 [IU] | PEN_INJECTOR | Freq: Every day | SUBCUTANEOUS | 1 refills | Status: DC

## 2021-11-25 MED ORDER — OMEPRAZOLE 20 MG PO CPDR: 20.0000 mg | DELAYED_RELEASE_CAPSULE | Freq: Every day | ORAL | 0 refills | Status: DC

## 2021-11-25 MED ORDER — LOSARTAN POTASSIUM 25 MG PO TABS: ORAL_TABLET | ORAL | 0 refills | Status: DC

## 2021-11-25 MED ORDER — ATORVASTATIN CALCIUM 20 MG PO TABS: 20.0000 mg | ORAL_TABLET | Freq: Every day | ORAL | 0 refills | Status: DC

## 2021-11-25 MED ORDER — GLUCOSE BLOOD VI STRP: 1.0000 | ORAL_STRIP | 12 refills | Status: DC | PRN

## 2021-11-25 NOTE — Assessment & Plan Note (Signed)
Not under good control with A1c of 12.9. Will change from ozempic to [facility]. Will cut his tresiba to 20 units to avoid hypoglycemia. Will get him set up with continuous glucose monitoring due to fluctuating sugars and hypoglycemia. Recheck 1 month. Call with any concerns. Refills given.

## 2021-11-25 NOTE — Assessment & Plan Note (Signed)
Under good control on current regimen. Continue current regimen. Continue to monitor. Call with any concerns. Refills given. Labs drawn today.   

## 2021-11-25 NOTE — Progress Notes (Signed)
BP 113/72   Pulse 89   Temp 98.4 F (36.9 C) (Oral)   Ht '5\' 8"'$  (1.727 m)   Wt 137 lb 12.8 oz (62.5 kg)   SpO2 100%   BMI 20.95 kg/m    Subjective:    Patient ID: Arthur [name], male    DOB: 02/22/1965, 56 y.o.   MRN: [id number]  HPI: FLEM ENDERLE is a 56 y.o. male presenting on 11/25/2021 for comprehensive medical examination. Current medical complaints include:  HYPERTENSION / HYPERLIPIDEMIA Satisfied with current treatment? yes Duration of hypertension: chronic BP monitoring frequency: not checking BP medication side effects: no Past BP meds: losartan Duration of hyperlipidemia: chronic Cholesterol medication side effects: no Cholesterol supplements: none Past cholesterol medications: atorvastatin Medication compliance: fair compliance Aspirin: yes Recent stressors: no Recurrent headaches: no Visual changes: no Palpitations: no Dyspnea: no Chest pain: no Lower extremity edema: no Dizzy/lightheaded: no  DIABETES- has not been checking his sugars.  Hypoglycemic episodes:yes Polydipsia/polyuria: no Visual disturbance: no Chest pain: no Paresthesias: no Glucose Monitoring: no  Accucheck frequency: Not Checking  Fasting glucose: not checking Taking Insulin?:  not like he's supposed to  Long acting insulin: 30 Blood Pressure Monitoring: not checking Retinal Examination: Up to Date Foot Exam: Up to Date Diabetic Education: Completed Pneumovax: Up to Date Influenza: Up to Date Aspirin: yes  BPH BPH status: exacerbated Satisfied with current treatment?: yes Medication side effects: N/A Duration: chronic Nocturia: 2-3x per night Urinary frequency:yes Incomplete voiding: no Urgency: no Weak urinary stream: no Straining to start stream: no Dysuria: no Onset: gradual Severity: mild  He currently lives with: wife Interim Problems from his last visit: no  Depression Screen done today and results listed below:     11/25/2021    3:41 PM 03/11/2021     3:56 PM 12/10/2020    4:15 PM 10/03/2019   11:07 AM 06/24/2018    9:24 AM  Depression screen PHQ 2/9  Decreased Interest 0 0 0 0 0  Down, Depressed, Hopeless 0 0 0 0 0  PHQ - 2 Score 0 0 0 0 0  Altered sleeping 2 2  0 0  Tired, decreased energy 2 1  0 0  Change in appetite 0 2  0 0  Feeling bad or failure about yourself  0 0  0 0  Trouble concentrating 0 1  0 0  Moving slowly or fidgety/restless 0 0  0 0  Suicidal thoughts 0 0  0 0  PHQ-9 Score 4 6  0 0  Difficult doing work/chores Not difficult at all        Past Medical History:  Past Medical History:  Diagnosis Date   Diabetes mellitus with renal complications ([place])    Erectile dysfunction associated with type 2 diabetes mellitus ([place])    Exotropia    History of tobacco abuse    Hyperlipidemia    Hypertension    Thoracic back pain     Surgical History:  Past Surgical History:  Procedure Laterality Date   COLONOSCOPY WITH PROPOFOL N/A 01/25/2019   Procedure: COLONOSCOPY WITH PROPOFOL;  Surgeon: Lin Landsman, MD;  Location: [facility];  Service: Gastroenterology;  Laterality: N/A;  Positive on 12/22/2018   HIP SURGERY     SHOULDER SURGERY Left    bone spurs removed    Medications:  Current Outpatient Medications on File Prior to Visit  Medication Sig   ASPIRIN LOW DOSE 81 MG tablet TAKE 1 TABLET(81 MG) BY MOUTH DAILY  Insulin Pen Needle (NOVOFINE PLUS) 32G X 4 MM MISC Use one needle daily with insulin   ipratropium (ATROVENT) 0.06 % nasal spray Place 2 sprays into both nostrils 4 (four) times daily.   No current facility-administered medications on file prior to visit.    Allergies:  Allergies  Allergen Reactions   Codeine Nausea Only   Hydrocodone Nausea Only   Oxycodone Nausea Only    Social History:  Social History   Socioeconomic History   Marital status: Married    Spouse name: Not on file   Number of children: Not on file   Years of education: Not on file   Highest education level:  Not on file  Occupational History   Not on file  Tobacco Use   Smoking status: Former    Packs/day: 1.00    Years: 20.00    Total pack years: 20.00    Types: Cigarettes    Quit date: 01/05/1986    Years since quitting: 35.9   Smokeless tobacco: Never  Vaping Use   Vaping Use: Never used  Substance and Sexual Activity   Alcohol use: No   Drug use: No   Sexual activity: Yes  Other Topics Concern   Not on file  Social History Narrative   Not on file   Social Determinants of Health   Financial Resource Strain: Not on file  Food Insecurity: Not on file  Transportation Needs: Not on file  Physical Activity: Not on file  Stress: Not on file  Social Connections: Not on file  Intimate Partner Violence: Not on file   Social History   Tobacco Use  Smoking Status Former   Packs/day: 1.00   Years: 20.00   Total pack years: 20.00   Types: Cigarettes   Quit date: 01/05/1986   Years since quitting: 35.9  Smokeless Tobacco Never   Social History   Substance and Sexual Activity  Alcohol Use No    Family History:  Family History  Problem Relation Age of Onset   Diabetes Father    Lung disease Mother    Diabetes Sister    Diabetes Maternal Aunt     Past medical history, surgical history, medications, allergies, family history and social history reviewed with patient today and changes made to appropriate areas of the chart.   Review of Systems  Constitutional:  Positive for diaphoresis. Negative for chills, fever, malaise/fatigue and weight loss.  HENT:  Positive for ear pain. Negative for congestion, ear discharge, hearing loss, nosebleeds, sinus pain, sore throat and tinnitus.   Eyes: Negative.   Respiratory: Negative.  Negative for stridor.   Cardiovascular: Negative.   Gastrointestinal:  Positive for abdominal pain and heartburn. Negative for blood in stool, constipation, diarrhea, melena, nausea and vomiting.  Genitourinary: Negative.   Musculoskeletal: Negative.    Skin: Negative.   Endo/Heme/Allergies: Negative.    All other ROS negative except what is listed above and in the HPI.      Objective:    BP 113/72   Pulse 89   Temp 98.4 F (36.9 C) (Oral)   Ht '5\' 8"'$  (1.727 m)   Wt 137 lb 12.8 oz (62.5 kg)   SpO2 100%   BMI 20.95 kg/m   Wt Readings from Last 3 Encounters:  11/25/21 137 lb 12.8 oz (62.5 kg)  03/11/21 143 lb 3.2 oz (65 kg)  12/10/20 150 lb 3.2 oz (68.1 kg)    Physical Exam Vitals and nursing note reviewed.  Constitutional:  General: He is not in acute distress.    Appearance: Normal appearance. He is not ill-appearing, toxic-appearing or diaphoretic.  HENT:     Head: Normocephalic and atraumatic.     Right Ear: Tympanic membrane, ear canal and external ear normal. There is no impacted cerumen.     Left Ear: Tympanic membrane, ear canal and external ear normal. There is no impacted cerumen.     Nose: Nose normal. No congestion or rhinorrhea.     Mouth/Throat:     Mouth: Mucous membranes are moist.     Pharynx: Oropharynx is clear. No oropharyngeal exudate or posterior oropharyngeal erythema.  Eyes:     General: No scleral icterus.       Right eye: No discharge.        Left eye: No discharge.     Extraocular Movements: Extraocular movements intact.     Conjunctiva/sclera: Conjunctivae normal.     Pupils: Pupils are equal, round, and reactive to light.  Neck:     Vascular: No carotid bruit.  Cardiovascular:     Rate and Rhythm: Normal rate and regular rhythm.     Pulses: Normal pulses.     Heart sounds: No murmur heard.    No friction rub. No gallop.  Pulmonary:     Effort: Pulmonary effort is normal. No respiratory distress.     Breath sounds: Normal breath sounds. No stridor. No wheezing, rhonchi or rales.  Chest:     Chest wall: No tenderness.  Abdominal:     General: Abdomen is flat. Bowel sounds are normal. There is no distension.     Palpations: Abdomen is soft. There is no mass.     Tenderness: There  is no abdominal tenderness. There is no right CVA tenderness, left CVA tenderness, guarding or rebound.     Hernia: No hernia is present.  Genitourinary:    Comments: Genital exam deferred with shared decision making Musculoskeletal:        General: No swelling, tenderness, deformity or signs of injury.     Cervical back: Normal range of motion and neck supple. No rigidity. No muscular tenderness.     Right lower leg: No edema.     Left lower leg: No edema.  Lymphadenopathy:     Cervical: No cervical adenopathy.  Skin:    General: Skin is warm and dry.     Capillary Refill: Capillary refill takes less than 2 seconds.     Coloration: Skin is not jaundiced or pale.     Findings: No bruising, erythema, lesion or rash.  Neurological:     General: No focal deficit present.     Mental Status: He is alert and oriented to person, place, and time.     Cranial Nerves: No cranial nerve deficit.     Sensory: No sensory deficit.     Motor: No weakness.     Coordination: Coordination normal.     Gait: Gait normal.     Deep Tendon Reflexes: Reflexes normal.  Psychiatric:        Mood and Affect: Mood normal.        Behavior: Behavior normal.        Thought Content: Thought content normal.        Judgment: Judgment normal.     Results for orders placed or performed in visit on 11/25/21  Microscopic Examination   Urine  Result Value Ref Range   WBC, UA 0-5 0 - 5 /hpf   RBC, Urine None  seen 0 - 2 /hpf   Epithelial Cells (non renal) None seen 0 - 10 /hpf   Bacteria, UA None seen None seen/Few  Urinalysis, Routine w reflex microscopic  Result Value Ref Range   Specific Gravity, UA 1.015 1.005 - 1.030   pH, UA 5.0 5.0 - 7.5   Color, UA Yellow Yellow   Appearance Ur Clear Clear   Leukocytes,UA Negative Negative   Protein,UA 1+ (A) Negative/Trace   Glucose, UA Negative Negative   Ketones, UA Trace (A) Negative   RBC, UA Negative Negative   Bilirubin, UA Negative Negative   Urobilinogen,  Ur 0.2 0.2 - 1.0 mg/dL   Nitrite, UA Negative Negative   Microscopic Examination See below:   Bayer DCA Hb A1c Waived  Result Value Ref Range   HB A1C (BAYER DCA - WAIVED) 12.9 (H) 4.8 - 5.6 %  Microalbumin, Urine Waived  Result Value Ref Range   Microalb, Ur Waived 80 (H) 0 - 19 mg/L   Creatinine, Urine Waived 100 10 - 300 mg/dL   Microalb/Creat Ratio 30-300 (H) <30 mg/g      Assessment & Plan:   Problem List Items Addressed This Visit       Digestive   Gastroesophageal reflux disease without esophagitis    Under good control on current regimen. Continue current regimen. Continue to monitor. Call with any concerns. Refills given. Labs drawn today.        Relevant Medications   omeprazole (PRILOSEC) 20 MG capsule     Endocrine   Type 2 diabetes mellitus with renal manifestations ([place])    Not under good control with A1c of 12.9. Will change from ozempic to [facility]. Will cut his tresiba to 20 units to avoid hypoglycemia. Will get him set up with continuous glucose monitoring due to fluctuating sugars and hypoglycemia. Recheck 1 month. Call with any concerns. Refills given.       Relevant Medications   tirzepatide (MOUNJARO) 10 MG/0.5ML Pen   insulin degludec (TRESIBA FLEXTOUCH) 200 UNIT/ML FlexTouch Pen   atorvastatin (LIPITOR) 20 MG tablet   losartan (COZAAR) 25 MG tablet   metFORMIN (GLUCOPHAGE) 1000 MG tablet   Other Relevant Orders   Comprehensive metabolic panel   CBC with Differential/Platelet   Urinalysis, Routine w reflex microscopic (Completed)   Bayer DCA Hb A1c Waived (Completed)   Microalbumin, Urine Waived (Completed)     Genitourinary   Benign hypertensive renal disease    Under good control on current regimen. Continue current regimen. Continue to monitor. Call with any concerns. Refills given. Labs drawn today.        Relevant Orders   Comprehensive metabolic panel   CBC with Differential/Platelet   TSH   Microalbumin, Urine Waived (Completed)    Benign prostatic hyperplasia    Rechecking labs today. Await results. Treat as needed.       Relevant Orders   Comprehensive metabolic panel   CBC with Differential/Platelet   PSA     Other   Dyslipidemia    Rechecking labs today. Await results. Treat as needed.       Relevant Medications   atorvastatin (LIPITOR) 20 MG tablet   Other Relevant Orders   Comprehensive metabolic panel   CBC with Differential/Platelet   Lipid Panel w/o Chol/HDL Ratio   Other Visit Diagnoses     Routine general medical examination at a health care facility    -  Primary   Vaccines updated. Screening labs checked today. Colonoscopy up to date. Continue  diet and exercise. Call with any concerns.        Discussed aspirin prophylaxis for myocardial infarction prevention and decision was made to continue ASA  LABORATORY TESTING:  Health maintenance labs ordered today as discussed above.   The natural history of prostate cancer and ongoing controversy regarding screening and potential treatment outcomes of prostate cancer has been discussed with the patient. The meaning of a false positive PSA and a false negative PSA has been discussed. He indicates understanding of the limitations of this screening test and wishes to proceed with screening PSA testing.   IMMUNIZATIONS:   - Tdap: Tetanus vaccination status reviewed: last tetanus booster within 10 years. - Influenza: Up to date - Pneumovax: Up to date - Prevnar: Not applicable - COVID: Up to date - HPV: Not applicable - Shingrix vaccine: Administered today  SCREENING: - Colonoscopy: Up to date  Discussed with patient purpose of the colonoscopy is to detect colon cancer at curable precancerous or early stages   PATIENT COUNSELING:    Sexuality: Discussed sexually transmitted diseases, partner selection, use of condoms, avoidance of unintended pregnancy  and contraceptive alternatives.   Advised to avoid cigarette smoking.  I discussed with  the patient that most people either abstain from alcohol or drink within safe limits (<=14/week and <=4 drinks/occasion for males, <=7/weeks and <= 3 drinks/occasion for females) and that the risk for alcohol disorders and other health effects rises proportionally with the number of drinks per week and how often a drinker exceeds daily limits.  Discussed cessation/primary prevention of drug use and availability of treatment for abuse.   Diet: Encouraged to adjust caloric intake to maintain  or achieve ideal body weight, to reduce intake of dietary saturated fat and total fat, to limit sodium intake by avoiding high sodium foods and not adding table salt, and to maintain adequate dietary potassium and calcium preferably from fresh fruits, vegetables, and low-fat dairy products.    stressed the importance of regular exercise  Injury prevention: Discussed safety belts, safety helmets, smoke detector, smoking near bedding or upholstery.   Dental health: Discussed importance of regular tooth brushing, flossing, and dental visits.   Follow up plan: NEXT PREVENTATIVE PHYSICAL DUE IN 1 YEAR. Return in about 4 weeks (around 12/23/2021) for follow up Sugars (OK before or after his trip).

## 2021-11-25 NOTE — Assessment & Plan Note (Signed)
Rechecking labs today. Await results. Treat as needed.  °

## 2021-11-26 ENCOUNTER — Other Ambulatory Visit: Payer: Self-pay

## 2021-11-26 LAB — COMPREHENSIVE METABOLIC PANEL
ALT: 18 IU/L (ref 0–44)
AST: 12 IU/L (ref 0–40)
Albumin/Globulin Ratio: 2.1 (ref 1.2–2.2)
Albumin: 3.9 g/dL (ref 3.8–4.9)
Alkaline Phosphatase: 103 IU/L (ref 44–121)
BUN/Creatinine Ratio: 9 (ref 9–20)
BUN: 10 mg/dL (ref 6–24)
Bilirubin Total: 0.3 mg/dL (ref 0.0–1.2)
CO2: 22 mmol/L (ref 20–29)
Calcium: 9.4 mg/dL (ref 8.7–10.2)
Chloride: 105 mmol/L (ref 96–106)
Creatinine, Ser: 1.13 mg/dL (ref 0.76–1.27)
Globulin, Total: 1.9 g/dL (ref 1.5–4.5)
Glucose: 232 mg/dL — ABNORMAL HIGH (ref 70–99)
Potassium: 4.7 mmol/L (ref 3.5–5.2)
Sodium: 140 mmol/L (ref 134–144)
Total Protein: 5.8 g/dL — ABNORMAL LOW (ref 6.0–8.5)
eGFR: 75 mL/min/{1.73_m2} (ref >59)

## 2021-11-26 LAB — LIPID PANEL W/O CHOL/HDL RATIO
Cholesterol, Total: 126 mg/dL (ref 100–199)
HDL: 41 mg/dL (ref >39)
LDL Chol Calc (NIH): 68 mg/dL (ref 0–99)
Triglycerides: 87 mg/dL (ref 0–149)
VLDL Cholesterol Cal: 17 mg/dL (ref 5–40)

## 2021-11-26 LAB — CBC WITH DIFFERENTIAL/PLATELET
Basophils Absolute: 0 10*3/uL (ref 0.0–0.2)
Basos: 0 %
EOS (ABSOLUTE): 0.2 10*3/uL (ref 0.0–0.4)
Eos: 2 %
Hematocrit: 35.6 % — ABNORMAL LOW (ref 37.5–51.0)
Hemoglobin: 11.7 g/dL — ABNORMAL LOW (ref 13.0–17.7)
Immature Grans (Abs): 0 10*3/uL (ref 0.0–0.1)
Immature Granulocytes: 0 %
Lymphocytes Absolute: 1.7 10*3/uL (ref 0.7–3.1)
Lymphs: 21 %
MCH: 27.9 pg (ref 26.6–33.0)
MCHC: 32.9 g/dL (ref 31.5–35.7)
MCV: 85 fL (ref 79–97)
Monocytes Absolute: 0.7 10*3/uL (ref 0.1–0.9)
Monocytes: 8 %
Neutrophils Absolute: 5.6 10*3/uL (ref 1.4–7.0)
Neutrophils: 69 %
Platelets: 291 10*3/uL (ref 150–450)
RBC: 4.2 x10E6/uL (ref 4.14–5.80)
RDW: 13.1 % (ref 11.6–15.4)
WBC: 8.1 10*3/uL (ref 3.4–10.8)

## 2021-11-26 LAB — PSA: Prostate Specific Ag, Serum: 0.8 ng/mL (ref 0.0–4.0)

## 2021-11-26 LAB — TSH: TSH: 1.83 u[IU]/mL (ref 0.450–4.500)

## 2021-11-26 NOTE — Telephone Encounter (Signed)
-----   Message from Valerie Roys, [place] sent at 11/25/2021  3:59 PM EST ----- Continuous glucose monitoring system please

## 2021-12-01 MED ORDER — FREESTYLE LIBRE SENSOR SYSTEM MISC: 12 refills | Status: DC

## 2021-12-05 ENCOUNTER — Telehealth: Payer: Self-pay

## 2021-12-05 NOTE — Telephone Encounter (Signed)
PA initiated via CoverMyMeds for [employer] 14 day sensor.  Key: [id number] Waiting on determination.

## 2021-12-05 NOTE — Telephone Encounter (Signed)
PA has been approved via CoverMyMeds through 12/06/2022. Will notify patient.

## 2021-12-09 ENCOUNTER — Telehealth: Payer: Self-pay

## 2021-12-09 NOTE — Telephone Encounter (Signed)
Spoke with Janel from [employer] to follow up with the status of patient's previous prior authorization initiated for Sildenafil TAB 100 mg. Fax was received that PA was cancelled due to [employer] not the ones to handle the PA and to check the patient's insurance card and contact that number. [employer] was contacted and informed patient had two profiles and new PA was initiated via telephone and it may take up to 48-72 hours before a response.   CASE # [id number]

## 2021-12-14 ENCOUNTER — Other Ambulatory Visit: Payer: Self-pay | Admitting: Family Medicine

## 2021-12-15 NOTE — Telephone Encounter (Signed)
Dc'd 11/25/21 Reorder Dr Jerilynn Mages. Arthur [name]  Requested Prescriptions  Refused Prescriptions Disp Refills   atorvastatin (LIPITOR) 40 MG tablet [Pharmacy Med Name: ATORVASTATIN '40MG'$  TABLETS] 60 tablet 0    Sig: TAKE 2 TABLETS(80 MG) BY MOUTH DAILY     Cardiovascular:  Antilipid - Statins Failed - 12/14/2021  6:41 AM      Failed - Lipid Panel in normal range within the last 12 months    Cholesterol, Total  Date Value Ref Range Status  11/25/2021 126 100 - 199 mg/dL Final   LDL Chol Calc (NIH)  Date Value Ref Range Status  11/25/2021 68 0 - 99 mg/dL Final   HDL  Date Value Ref Range Status  11/25/2021 41 >39 mg/dL Final   Triglycerides  Date Value Ref Range Status  11/25/2021 87 0 - 149 mg/dL Final         Passed - Patient is not pregnant      Passed - Valid encounter within last 12 months    Recent Outpatient Visits           2 weeks ago Routine general medical examination at a health care facility   [facility], [place] P, DO   9 months ago Type 2 diabetes mellitus with diabetic nephropathy, with long-term current use of insulin ([place])   [place], Megan P, DO   1 year ago Type 2 diabetes mellitus with diabetic nephropathy, with long-term current use of insulin ([place])   [place], [place], DO   1 year ago Chronic cough   [place], [place], DO   2 years ago Onychomycosis   [place] Myles Gip, DO       Future Appointments             In 3 weeks Arthur [name], Arthur Merino, DO [employer]

## 2021-12-22 ENCOUNTER — Telehealth: Payer: Self-pay | Admitting: Family Medicine

## 2021-12-22 NOTE — Telephone Encounter (Signed)
Pt dropped off disability parking placard form to be completed. He said that he would need it by Wednesday. I let him know the turnaound time is about 7 business days and we would call him when it is completed. Form placed in provider's folder.

## 2021-12-22 NOTE — Telephone Encounter (Signed)
Patient called back wanted to add, needs handcapped paperwork done right away, because hie is going out of town Wednesday and needs it to get thru the airport.

## 2021-12-22 NOTE — Telephone Encounter (Signed)
Can we please find out why he needs handicap placquard

## 2021-12-23 NOTE — Telephone Encounter (Signed)
Spoke with patient and notified and made him aware of Dr.Johnson's recommendations. Patient verbalized understanding.

## 2022-01-06 ENCOUNTER — Ambulatory Visit (INDEPENDENT_AMBULATORY_CARE_PROVIDER_SITE_OTHER): Payer: 59 | Admitting: Family Medicine

## 2022-01-06 ENCOUNTER — Encounter: Payer: Self-pay | Admitting: Family Medicine

## 2022-01-06 VITALS — BP 123/83 | HR 103 | Ht 68.0 in | Wt 135.1 lb

## 2022-01-06 DIAGNOSIS — Z794 Long term (current) use of insulin: Secondary | ICD-10-CM | POA: Diagnosis not present

## 2022-01-06 DIAGNOSIS — R079 Chest pain, unspecified: Secondary | ICD-10-CM | POA: Diagnosis not present

## 2022-01-06 DIAGNOSIS — E1121 Type 2 diabetes mellitus with diabetic nephropathy: Secondary | ICD-10-CM | POA: Diagnosis not present

## 2022-01-06 LAB — BAYER DCA HB A1C WAIVED: HB A1C (BAYER DCA - WAIVED): 10.1 % — ABNORMAL HIGH (ref 4.8–5.6)

## 2022-01-06 MED ORDER — TIRZEPATIDE 12.5 MG/0.5ML ~~LOC~~ SOAJ: 12.5000 mg | SUBCUTANEOUS | 1 refills | Status: DC

## 2022-01-06 NOTE — Progress Notes (Signed)
Interpreted by me today. NSR at 92 bpm. No ST segment changes

## 2022-01-06 NOTE — Progress Notes (Signed)
BP 123/83   Pulse (!) 103   Ht '5\' 8"'$  (1.727 m)   Wt 135 lb 1.6 oz (61.3 kg)   SpO2 98%   BMI 20.54 kg/m    Subjective:    Patient ID: Arthur [name], male    DOB: 17-Sep-1965, 57 y.o.   MRN: [id number]  HPI: Arthur [name] is a 57 y.o. male  Chief Complaint  Patient presents with   Diabetes   DIABETES Hypoglycemic episodes:no Polydipsia/polyuria: no Visual disturbance: no Chest pain: no Paresthesias: no Glucose Monitoring: yes  Accucheck frequency: continuous  Fasting glucose: 100  Post prandial: 290 Taking Insulin?: not using any insulin right now Blood Pressure Monitoring: not checking Retinal Examination: Up to Date Foot Exam: Up to Date Diabetic Education: Not Completed Pneumovax: Up to Date Influenza: Up to Date Aspirin: yes  CHEST PAIN Duration:about a week Onset: sudden Quality: indigestion Severity: severe Location: upper sternum Radiation: back Episode duration: hours Frequency: once Related to exertion: no Trauma: no Anxiety/recent stressors: no Status: better Treatments attempted: antacids  Current pain status: pain free Shortness of breath: no Cough: no Nausea: no Diaphoresis: no Heartburn: no Palpitations: no  Relevant past medical, surgical, family and social history reviewed and updated as indicated. Interim medical history since our last visit reviewed. Allergies and medications reviewed and updated.  Review of Systems  Constitutional: Negative.   Respiratory: Negative.    Cardiovascular: Negative.   Gastrointestinal: Negative.   Musculoskeletal: Negative.   Psychiatric/Behavioral: Negative.      Per HPI unless specifically indicated above     Objective:    BP 123/83   Pulse (!) 103   Ht '5\' 8"'$  (1.727 m)   Wt 135 lb 1.6 oz (61.3 kg)   SpO2 98%   BMI 20.54 kg/m   Wt Readings from Last 3 Encounters:  01/06/22 135 lb 1.6 oz (61.3 kg)  11/25/21 137 lb 12.8 oz (62.5 kg)  03/11/21 143 lb 3.2 oz (65 kg)    Physical  Exam Vitals and nursing note reviewed.  Constitutional:      General: He is not in acute distress.    Appearance: Normal appearance. He is not ill-appearing, toxic-appearing or diaphoretic.  HENT:     Head: Normocephalic and atraumatic.     Right Ear: External ear normal.     Left Ear: External ear normal.     Nose: Nose normal.     Mouth/Throat:     Mouth: Mucous membranes are moist.     Pharynx: Oropharynx is clear.  Eyes:     General: No scleral icterus.       Right eye: No discharge.        Left eye: No discharge.     Extraocular Movements: Extraocular movements intact.     Conjunctiva/sclera: Conjunctivae normal.     Pupils: Pupils are equal, round, and reactive to light.  Cardiovascular:     Rate and Rhythm: Normal rate and regular rhythm.     Pulses: Normal pulses.     Heart sounds: Normal heart sounds. No murmur heard.    No friction rub. No gallop.  Pulmonary:     Effort: Pulmonary effort is normal. No respiratory distress.     Breath sounds: Normal breath sounds. No stridor. No wheezing, rhonchi or rales.  Chest:     Chest wall: No tenderness.  Musculoskeletal:        General: Normal range of motion.     Cervical back: Normal range of motion and  neck supple.  Skin:    General: Skin is warm and dry.     Capillary Refill: Capillary refill takes less than 2 seconds.     Coloration: Skin is not jaundiced or pale.     Findings: No bruising, erythema, lesion or rash.  Neurological:     General: No focal deficit present.     Mental Status: He is alert and oriented to person, place, and time. Mental status is at baseline.  Psychiatric:        Mood and Affect: Mood normal.        Behavior: Behavior normal.        Thought Content: Thought content normal.        Judgment: Judgment normal.     Results for orders placed or performed in visit on 01/06/22  Bayer DCA Hb A1c Waived (STAT)  Result Value Ref Range   HB A1C (BAYER DCA - WAIVED) 10.1 (H) 4.8 - 5.6 %       Assessment & Plan:   Problem List Items Addressed This Visit       Endocrine   Type 2 diabetes mellitus with renal manifestations ([facility]) - Primary    Tolerating his mounjaro well with A1c down to 10.1 and off his insulin. Will increase to 12.5 and recheck in about 6 weeks. Call with any concerns. Continue to monitor.       Relevant Medications   tirzepatide (MOUNJARO) 12.5 MG/0.5ML Pen   Other Relevant Orders   Bayer DCA Hb A1c Waived (STAT) (Completed)   Other Visit Diagnoses     Chest pain, unspecified type       EKG normal. No pain today. Continue to monitor. Call with any concerns.   Relevant Orders   EKG 12-Lead (Completed)        Follow up plan: Return in about 6 weeks (around 02/17/2022).

## 2022-01-06 NOTE — Assessment & Plan Note (Signed)
Tolerating his mounjaro well with A1c down to 10.1 and off his insulin. Will increase to 12.5 and recheck in about 6 weeks. Call with any concerns. Continue to monitor.

## 2022-01-30 ENCOUNTER — Other Ambulatory Visit: Payer: Self-pay | Admitting: Family Medicine

## 2022-02-02 NOTE — Telephone Encounter (Signed)
Requested Prescriptions  Pending Prescriptions Disp Refills   ASPIRIN LOW DOSE 81 MG tablet [Pharmacy Med Name: ASPIRIN '81MG'$  EC LOW DOSE  TABLETS] 90 tablet 0    Sig: TAKE 1 TABLET(81 MG) BY MOUTH DAILY     Analgesics:  NSAIDS - aspirin Passed - 01/30/2022  6:00 PM      Passed - Cr in normal range and within 360 days    Creatinine, Ser  Date Value Ref Range Status  11/25/2021 1.13 0.76 - 1.27 mg/dL Final         Passed - eGFR is 10 or above and within 360 days    GFR calc Af Amer  Date Value Ref Range Status  10/03/2019 75 >59 mL/min/1.73 Final    Comment:    **[facility] currently reports eGFR in compliance with the current**   recommendations of the [employer]. [facility] will   update reporting as new guidelines are published from the NKF-ASN   Task force.    GFR calc non Af Amer  Date Value Ref Range Status  10/03/2019 65 >59 mL/min/1.73 Final   eGFR  Date Value Ref Range Status  11/25/2021 75 >59 mL/min/1.73 Final         Passed - Patient is not pregnant      Passed - Valid encounter within last 12 months    Recent Outpatient Visits           3 weeks ago Type 2 diabetes mellitus with diabetic nephropathy, with long-term current use of insulin ([place])   [place], Megan P, DO   2 months ago Routine general medical examination at a health care facility   [facility], [place] P, DO   10 months ago Type 2 diabetes mellitus with diabetic nephropathy, with long-term current use of insulin ([place])   [place], Megan P, DO   1 year ago Type 2 diabetes mellitus with diabetic nephropathy, with long-term current use of insulin ([place])   [place], Bivins, DO   1 year ago Chronic cough   [place], Barb Merino, DO       Future Appointments             In 2 weeks Wynetta Emery, Barb Merino, DO [place]

## 2022-02-17 ENCOUNTER — Encounter: Payer: Self-pay | Admitting: Family Medicine

## 2022-02-17 ENCOUNTER — Ambulatory Visit (INDEPENDENT_AMBULATORY_CARE_PROVIDER_SITE_OTHER): Payer: 59 | Admitting: Family Medicine

## 2022-02-17 VITALS — BP 121/81 | HR 94 | Temp 97.9°F | Ht 68.0 in | Wt 133.7 lb

## 2022-02-17 DIAGNOSIS — E1121 Type 2 diabetes mellitus with diabetic nephropathy: Secondary | ICD-10-CM | POA: Diagnosis not present

## 2022-02-17 DIAGNOSIS — Z794 Long term (current) use of insulin: Secondary | ICD-10-CM | POA: Diagnosis not present

## 2022-02-17 DIAGNOSIS — R143 Flatulence: Secondary | ICD-10-CM | POA: Diagnosis not present

## 2022-02-17 MED ORDER — FREESTYLE LIBRE SENSOR SYSTEM MISC: 12 refills | Status: DC

## 2022-02-17 MED ORDER — SIMETHICONE 80 MG PO CHEW: 80.0000 mg | CHEWABLE_TABLET | Freq: Four times a day (QID) | ORAL | 2 refills | Status: DC | PRN

## 2022-02-17 MED ORDER — OMEPRAZOLE 40 MG PO CPDR: 40.0000 mg | DELAYED_RELEASE_CAPSULE | Freq: Every day | ORAL | 1 refills | Status: DC

## 2022-02-17 MED ORDER — ATORVASTATIN CALCIUM 20 MG PO TABS: 20.0000 mg | ORAL_TABLET | Freq: Every day | ORAL | 0 refills | Status: DC

## 2022-02-17 MED ORDER — METFORMIN HCL 1000 MG PO TABS: ORAL_TABLET | ORAL | 0 refills | Status: DC

## 2022-02-17 MED ORDER — LOSARTAN POTASSIUM 25 MG PO TABS: ORAL_TABLET | ORAL | 0 refills | Status: DC

## 2022-02-17 NOTE — Progress Notes (Signed)
BP 121/81   Pulse 94   Temp 97.9 F (36.6 C) (Oral)   Ht 5' 8"$  (1.727 m)   Wt 133 lb 11.2 oz (60.6 kg)   SpO2 99%   BMI 20.33 kg/m    Subjective:    Patient ID: Arthur [name], male    DOB: December 09, 1965, 57 y.o.   MRN: [id number]  HPI: Arthur [name] is a 57 y.o. male  Chief Complaint  Patient presents with  . Diabetes  . Gas    Patient says he is noticed some new gas related issues that he would like to discuss with provider at today's visit.    DIABETES- still taking the 37m on the mounjaro Hypoglycemic episodes:yes Polydipsia/polyuria: no Visual disturbance: no Chest pain: no Paresthesias: no Glucose Monitoring: continuous  Fasting glucose: 130s  Post prandial: 200+ Taking Insulin?: no Blood Pressure Monitoring: not checking Retinal Examination: Not up to Date Foot Exam: Up to Date Diabetic Education: {Blank single:19197::"Completed","Not Completed"} Pneumovax: {Blank single:19197::"Up to Date","Not up to Date","unknown"} Influenza: {Blank single:19197::"Up to Date","Not up to Date","unknown"} Aspirin: {Blank single:19197::"yes","no"}  Relevant past medical, surgical, family and social history reviewed and updated as indicated. Interim medical history since our last visit reviewed. Allergies and medications reviewed and updated.  Review of Systems  Constitutional: Negative.   Respiratory: Negative.    Cardiovascular: Negative.   Gastrointestinal:  Positive for abdominal distention and abdominal pain. Negative for anal bleeding, blood in stool, constipation, diarrhea, nausea, rectal pain and vomiting.       + gas  Musculoskeletal: Negative.   Psychiatric/Behavioral: Negative.      Per HPI unless specifically indicated above     Objective:    BP 121/81   Pulse 94   Temp 97.9 F (36.6 C) (Oral)   Ht 5' 8"$  (1.727 m)   Wt 133 lb 11.2 oz (60.6 kg)   SpO2 99%   BMI 20.33 kg/m   Wt Readings from Last 3 Encounters:  02/17/22 133 lb 11.2 oz (60.6 kg)   01/06/22 135 lb 1.6 oz (61.3 kg)  11/25/21 137 lb 12.8 oz (62.5 kg)    Physical Exam Vitals and nursing note reviewed.  Constitutional:      General: He is not in acute distress.    Appearance: Normal appearance. He is normal weight. He is not ill-appearing, toxic-appearing or diaphoretic.  HENT:     Head: Normocephalic and atraumatic.     Right Ear: External ear normal.     Left Ear: External ear normal.     Nose: Nose normal.     Mouth/Throat:     Mouth: Mucous membranes are moist.     Pharynx: Oropharynx is clear.  Eyes:     General: No scleral icterus.       Right eye: No discharge.        Left eye: No discharge.     Extraocular Movements: Extraocular movements intact.     Conjunctiva/sclera: Conjunctivae normal.     Pupils: Pupils are equal, round, and reactive to light.  Cardiovascular:     Rate and Rhythm: Normal rate and regular rhythm.     Pulses: Normal pulses.     Heart sounds: Normal heart sounds. No murmur heard.    No friction rub. No gallop.  Pulmonary:     Effort: Pulmonary effort is normal. No respiratory distress.     Breath sounds: Normal breath sounds. No stridor. No wheezing, rhonchi or rales.  Chest:     Chest wall:  No tenderness.  Musculoskeletal:        General: Normal range of motion.     Cervical back: Normal range of motion and neck supple.  Skin:    General: Skin is warm and dry.     Capillary Refill: Capillary refill takes less than 2 seconds.     Coloration: Skin is not jaundiced or pale.     Findings: No bruising, erythema, lesion or rash.  Neurological:     General: No focal deficit present.     Mental Status: He is alert and oriented to person, place, and time. Mental status is at baseline.  Psychiatric:        Mood and Affect: Mood normal.        Behavior: Behavior normal.        Thought Content: Thought content normal.        Judgment: Judgment normal.    Results for orders placed or performed in visit on 01/06/22  Bayer DCA Hb  A1c Waived (STAT)  Result Value Ref Range   HB A1C (BAYER DCA - WAIVED) 10.1 (H) 4.8 - 5.6 %      Assessment & Plan:   Problem List Items Addressed This Visit       Endocrine   Type 2 diabetes mellitus with renal manifestations (HCC) - Primary   Relevant Medications   atorvastatin (LIPITOR) 20 MG tablet   losartan (COZAAR) 25 MG tablet   metFORMIN (GLUCOPHAGE) 1000 MG tablet   Other Relevant Orders   Hgb A1c w/o eAG     Follow up plan: Return in about 6 weeks (around 03/31/2022).

## 2022-02-17 NOTE — Progress Notes (Unsigned)
Established Patient Office Visit  Subjective   Patient ID: Arthur [name], male    DOB: 05-02-1965  Age: 57 y.o. MRN: [id number]  Chief Complaint  Patient presents with   Diabetes   Gas    Patient says he is noticed some new gas related issues that he would like to discuss with provider at today's visit.     DIABETES  Polydipsia/polyuria: no Visual disturbance: no Chest pain: no Paresthesias: no Glucose Monitoring: yes  Accucheck frequency: TID  Fasting glucose: 116-130  Post prandial:140s  Evening:140s avg  Before meals:140s av Taking Insulin?: No    Retinal Examination: Up to Date Foot Exam: Up to Date Diabetic Education: Completed Influenza: Up to Date Aspirin: no   He admits to not taking his metformin doses x2 days out of the week because his BG drops to 60-70. He has had 6 Hypoglycemic episodes within the last 30 days, occurring between 9pm-6am; and admits his appetite has decreased.  Pt complains of gas that is constant for years, he is having gas every other day with a gradual onset that intensifies after each meal and when laying down. Takes prilosec in the morning and pepto at night; which provides minimal relief. Admits to nausea every other day at night, denies constipation, diarrhea, abdominal pain, and blood in stool.      {History (Optional):23778}  Review of Systems  Constitutional:  Negative for chills and fever.  Respiratory:  Negative for cough and shortness of breath.   Cardiovascular:  Negative for chest pain and palpitations.  Gastrointestinal:  Positive for nausea. Negative for abdominal pain, blood in stool, constipation, diarrhea, heartburn, melena and vomiting.       Flatulence  All other systems reviewed and are negative.     Objective:     BP 121/81   Pulse 94   Temp 97.9 F (36.6 C) (Oral)   Ht 5' 8"$  (1.727 m)   Wt 133 lb 11.2 oz (60.6 kg)   SpO2 99%   BMI 20.33 kg/m  {Vitals History (Optional):23777}  Physical  Exam Vitals and nursing note reviewed.  Constitutional:      Appearance: Normal appearance. He is not ill-appearing, toxic-appearing or diaphoretic.  HENT:     Head: Normocephalic and atraumatic.     Right Ear: External ear normal.     Left Ear: External ear normal.     Nose: Nose normal.     Mouth/Throat:     Mouth: Mucous membranes are moist.  Eyes:     Pupils: Pupils are equal, round, and reactive to Arthur.  Cardiovascular:     Rate and Rhythm: Normal rate and regular rhythm.     Pulses: Normal pulses.     Heart sounds: Normal heart sounds. No murmur heard.    No friction rub. No gallop.  Pulmonary:     Effort: Pulmonary effort is normal. No respiratory distress.     Breath sounds: Normal breath sounds. No stridor. No wheezing, rhonchi or rales.  Chest:     Chest wall: No tenderness.  Musculoskeletal:        General: Normal range of motion.  Skin:    General: Skin is warm and dry.     Coloration: Skin is not jaundiced or pale.     Findings: No bruising or erythema.  Neurological:     General: No focal deficit present.     Mental Status: He is alert and oriented to person, place, and time. Mental status is at baseline.  Motor: No weakness.  Psychiatric:        Mood and Affect: Mood normal.        Behavior: Behavior normal.        Thought Content: Thought content normal.        Judgment: Judgment normal.      No results found for any visits on 02/17/22.  {Labs (Optional):23779}  The ASCVD Risk score (Arnett DK, et al., 2019) failed to calculate for the following reasons:   The valid total cholesterol range is 130 to 320 mg/dL    Assessment & Plan:   Problem List Items Addressed This Visit       Endocrine   Type 2 diabetes mellitus with renal manifestations ([place]) - Primary    Controlled on continuous glucose monitor. Will continue current regimen. A1C lab done today per patient request.       Relevant Medications   atorvastatin (LIPITOR) 20 MG tablet    losartan (COZAAR) 25 MG tablet   metFORMIN (GLUCOPHAGE) 1000 MG tablet   Other Relevant Orders   Hgb A1c w/o eAG   Other Visit Diagnoses     Flatulence       Uncontrolled, will order simethicone and increase prilosec dose for relief.       Return in about 6 weeks (around 03/31/2022).    Rashelle Pearley

## 2022-02-17 NOTE — Assessment & Plan Note (Signed)
Sugars still running a little high, has not gone up to the 12.22m on his mounjaro yet- will start that. ?side effects from medication causing his GI upset. Will start silmethicone and increase PPI and recheck in about 6 weeks. Call with any concerns.

## 2022-02-18 ENCOUNTER — Encounter: Payer: Self-pay | Admitting: Family Medicine

## 2022-02-18 LAB — HGB A1C W/O EAG: Hgb A1c MFr Bld: 7.6 % — ABNORMAL HIGH (ref 4.8–5.6)

## 2022-03-31 ENCOUNTER — Encounter: Payer: Self-pay | Admitting: Family Medicine

## 2022-03-31 ENCOUNTER — Ambulatory Visit (INDEPENDENT_AMBULATORY_CARE_PROVIDER_SITE_OTHER): Payer: 59 | Admitting: Family Medicine

## 2022-03-31 VITALS — BP 127/83 | HR 82 | Temp 98.3°F | Ht 68.0 in | Wt 137.6 lb

## 2022-03-31 DIAGNOSIS — N486 Induration penis plastica: Secondary | ICD-10-CM | POA: Diagnosis not present

## 2022-03-31 DIAGNOSIS — Z794 Long term (current) use of insulin: Secondary | ICD-10-CM

## 2022-03-31 DIAGNOSIS — E1121 Type 2 diabetes mellitus with diabetic nephropathy: Secondary | ICD-10-CM | POA: Diagnosis not present

## 2022-03-31 NOTE — Assessment & Plan Note (Signed)
Doing well. Not due for A1c of 6 weeks. Will continue current regimen and recheck in 6 weeks. Call with any concerns.

## 2022-03-31 NOTE — Progress Notes (Signed)
BP 127/83   Pulse 82   Temp 98.3 F (36.8 C) (Oral)   Ht 5\' 8"  (1.727 m)   Wt 137 lb 9.6 oz (62.4 kg)   SpO2 98%   BMI 20.92 kg/m    Subjective:    Patient ID: Arthur [name], male    DOB: 02-09-1965, 57 y.o.   MRN: [id number]  HPI: Arthur [name] is a 57 y.o. male  Chief Complaint  Patient presents with   Diabetes   DIABETES Hypoglycemic episodes:no Polydipsia/polyuria: no Visual disturbance: no Chest pain: no Paresthesias: no Glucose Monitoring: yes  Accucheck frequency:  CGM - average 136 Taking Insulin?: no Blood Pressure Monitoring: not checking Retinal Examination: Up to Date Foot Exam: Up to Date Diabetic Education: Completed Pneumovax: Up to Date Influenza: Up to Date Aspirin: yes  Relevant past medical, surgical, family and social history reviewed and updated as indicated. Interim medical history since our last visit reviewed. Allergies and medications reviewed and updated.  Review of Systems  Constitutional: Negative.   Respiratory: Negative.    Cardiovascular: Negative.   Gastrointestinal: Negative.   Musculoskeletal: Negative.   Neurological: Negative.   Psychiatric/Behavioral: Negative.      Per HPI unless specifically indicated above     Objective:    BP 127/83   Pulse 82   Temp 98.3 F (36.8 C) (Oral)   Ht 5\' 8"  (1.727 m)   Wt 137 lb 9.6 oz (62.4 kg)   SpO2 98%   BMI 20.92 kg/m   Wt Readings from Last 3 Encounters:  03/31/22 137 lb 9.6 oz (62.4 kg)  02/17/22 133 lb 11.2 oz (60.6 kg)  01/06/22 135 lb 1.6 oz (61.3 kg)    Physical Exam Vitals and nursing note reviewed.  Constitutional:      General: He is not in acute distress.    Appearance: Normal appearance. He is normal weight. He is not ill-appearing, toxic-appearing or diaphoretic.  HENT:     Head: Normocephalic and atraumatic.     Right Ear: External ear normal.     Left Ear: External ear normal.     Nose: Nose normal.     Mouth/Throat:     Mouth: Mucous  membranes are moist.     Pharynx: Oropharynx is clear.  Eyes:     General: No scleral icterus.       Right eye: No discharge.        Left eye: No discharge.     Extraocular Movements: Extraocular movements intact.     Conjunctiva/sclera: Conjunctivae normal.     Pupils: Pupils are equal, round, and reactive to light.  Cardiovascular:     Rate and Rhythm: Normal rate and regular rhythm.     Pulses: Normal pulses.     Heart sounds: Normal heart sounds. No murmur heard.    No friction rub. No gallop.  Pulmonary:     Effort: Pulmonary effort is normal. No respiratory distress.     Breath sounds: Normal breath sounds. No stridor. No wheezing, rhonchi or rales.  Chest:     Chest wall: No tenderness.  Musculoskeletal:        General: Normal range of motion.     Cervical back: Normal range of motion and neck supple.  Skin:    General: Skin is warm and dry.     Capillary Refill: Capillary refill takes less than 2 seconds.     Coloration: Skin is not jaundiced or pale.     Findings: No bruising,  erythema, lesion or rash.  Neurological:     General: No focal deficit present.     Mental Status: He is alert and oriented to person, place, and time. Mental status is at baseline.  Psychiatric:        Mood and Affect: Mood normal.        Behavior: Behavior normal.        Thought Content: Thought content normal.        Judgment: Judgment normal.     Results for orders placed or performed in visit on 02/17/22  Hgb A1c w/o eAG  Result Value Ref Range   Hgb A1c MFr Bld 7.6 (H) 4.8 - 5.6 %      Assessment & Plan:   Problem List Items Addressed This Visit       Endocrine   Type 2 diabetes mellitus with renal manifestations ([place]) - Primary    Doing well. Not due for A1c of 6 weeks. Will continue current regimen and recheck in 6 weeks. Call with any concerns.       Other Visit Diagnoses     Peyronie's disease       Referral to urology placed today. Call with any concerns.   Relevant  Orders   Ambulatory referral to Urology        Follow up plan: Return in about 6 weeks (around 05/12/2022).

## 2022-04-09 ENCOUNTER — Ambulatory Visit (INDEPENDENT_AMBULATORY_CARE_PROVIDER_SITE_OTHER): Payer: 59 | Admitting: Urology

## 2022-04-09 ENCOUNTER — Encounter: Payer: Self-pay | Admitting: Urology

## 2022-04-09 VITALS — BP 143/87 | HR 90 | Ht 68.0 in | Wt 128.0 lb

## 2022-04-09 DIAGNOSIS — N486 Induration penis plastica: Secondary | ICD-10-CM

## 2022-04-09 NOTE — Progress Notes (Signed)
I, DeAsia L Maxie,acting as a scribe for Riki AltesScott C Finch Costanzo, MD.,have documented all relevant documentation on the behalf of Riki AltesScott C Nautika Cressey, MD,as directed by  Riki AltesScott C Anabelen Kaminsky, MD while in the presence of Riki AltesScott C Merril Nagy, MD.   I, Amy L Pierron,acting as a scribe for Riki AltesScott C Adrean Heitz, MD.,have documented all relevant documentation on the behalf of Riki AltesScott C Maricela Kawahara, MD,as directed by  Riki AltesScott C Zareya Tuckett, MD while in the presence of Riki AltesScott C Juliahna Wiswell, MD.  04/09/2022 3:29 PM   Arthur [name] Jun 04, 1965 [id number]  Referring provider: Dorcas CarrowJohnson, Megan P, DO [address] [place] [id number]  Chief Complaint  Patient presents with   Abnormal Penile Curvature    HPI: Arthur [name] is a 57 y.o. male referred for evaluation of Peyronie's disease.  Seen here several years ago for ED and was using a vacuum erection device. 2-3 years ago he used a tighter band which he had difficulty removing; 2 months after that incident he began to note penile curvature.  Presently he has ~ 45% mid shaft curvature to the left; no definite hourglass deformity. No pain with erections.  He states his ED has actually improved and he uses Sildenafil on occasion.   PMH: Past Medical History:  Diagnosis Date   Diabetes mellitus with renal complications    Erectile dysfunction associated with type 2 diabetes mellitus    Exotropia    History of tobacco abuse    Hyperlipidemia    Hypertension    Thoracic back pain     Surgical History: Past Surgical History:  Procedure Laterality Date   COLONOSCOPY WITH PROPOFOL N/A 01/25/2019   Procedure: COLONOSCOPY WITH PROPOFOL;  Surgeon: Toney ReilVanga, Rohini Reddy, MD;  Location: [facility] ENDOSCOPY;  Service: Gastroenterology;  Laterality: N/A;  Positive on 12/22/2018   HIP SURGERY     SHOULDER SURGERY Left    bone spurs removed    Home Medications:  Allergies as of 04/09/2022       Reactions   Codeine Nausea Only   Hydrocodone Nausea Only   Oxycodone Nausea Only         Medication List        Accurate as of April 09, 2022  3:29 PM. If you have any questions, ask your nurse or doctor.          Aspirin Low Dose 81 MG tablet Generic drug: aspirin EC TAKE 1 TABLET(81 MG) BY MOUTH DAILY   atorvastatin 20 MG tablet Commonly known as: LIPITOR Take 1 tablet (20 mg total) by mouth daily.   FreeStyle Libre Sensor System Misc AS DIRECTED   glucose blood test strip 1 each by Other route as needed for other. One touch ultra test strips. Use as instructed.   ipratropium 0.06 % nasal spray Commonly known as: ATROVENT Place 2 sprays into both nostrils 4 (four) times daily.   losartan 25 MG tablet Commonly known as: COZAAR TAKE 1/2 TABLET(12.5 MG) BY MOUTH DAILY   metFORMIN 1000 MG tablet Commonly known as: GLUCOPHAGE TAKE 1 TABLET(1000 MG) BY MOUTH TWICE DAILY   NovoFine Plus 32G X 4 MM Misc Generic drug: Insulin Pen Needle Use one needle daily with insulin   omeprazole 20 MG capsule Commonly known as: PRILOSEC Take 20 mg by mouth daily.   sildenafil 100 MG tablet Commonly known as: VIAGRA TAKE 1/2 TO 1 (ONE-HALF TO ONE) TABLET BY MOUTH ONCE DAILY AS NEEDED FOR ERECTILE DYSFUNCTION   simethicone 80 MG chewable tablet Commonly known  asWillaim Rayas: Gas-X Chew 1 tablet (80 mg total) by mouth every 6 (six) hours as needed for flatulence.   tirzepatide 12.5 MG/0.5ML Pen Commonly known as: MOUNJARO Inject 12.5 mg into the skin once a week.        Allergies:  Allergies  Allergen Reactions   Codeine Nausea Only   Hydrocodone Nausea Only   Oxycodone Nausea Only    Family History: Family History  Problem Relation Age of Onset   Diabetes Father    Lung disease Mother    Diabetes Sister    Diabetes Maternal Aunt     Social History:  reports that he quit smoking about 36 years ago. His smoking use included cigarettes. He has a 20.00 pack-year smoking history. He has never used smokeless tobacco. He reports that he does not drink alcohol and  does not use drugs.   Physical Exam: BP (!) 143/87   Pulse 90   Ht 5\' 8"  (1.727 m)   Wt 128 lb (58.1 kg)   BMI 19.46 kg/m   Constitutional:  Alert and oriented, No acute distress. HEENT: [place] AT Respiratory: Normal respiratory effort, no increased work of breathing. GU: Phallus circumcised. There's a mid-shaft left lateral plaque. Testes descended bilaterally, slightly atrophic. Skin: No rashes, bruises or suspicious lesions. Neurologic: Grossly intact, no focal deficits, moving all 4 extremities. Psychiatric: Normal mood and affect.    Assessment & Plan:    Peyronie's disease We discussed there is no oral therapy that is effective for Peyronie's disease.  We discussed that Dineen KidXiaflex is the only approved medication and a Xiaflex treatment cycle was reviewed. We also discussed the potential for penile rupture and no intercourse or masturbation is recommended 4 weeks after a treatment cycle. Surgery was also discussed. He is potentially interested in [place].  He was given literature and will call back if he desires to pursue.  I have reviewed the above documentation for accuracy and completeness, and I agree with the above.   Riki AltesScott C Kawon Willcutt, MD  [facility] [address], [address] [place], [place] [address] [phone]

## 2022-04-10 ENCOUNTER — Encounter: Payer: Self-pay | Admitting: Urology

## 2022-04-16 ENCOUNTER — Other Ambulatory Visit: Payer: Self-pay | Admitting: Family Medicine

## 2022-04-17 ENCOUNTER — Other Ambulatory Visit: Payer: Self-pay | Admitting: Family Medicine

## 2022-04-17 NOTE — Telephone Encounter (Signed)
Requested medication (s) are due for refill today: yes  Requested medication (s) are on the active medication list: yes  Last refill:  01/06/22  Future visit scheduled: yes  Notes to clinic:  Medication not assigned to a protocol, review manually.      Requested Prescriptions  Pending Prescriptions Disp Refills   MOUNJARO 12.5 MG/0.5ML Pen [Pharmacy Med Name: MOUNJARO 12.5MG /0.5ML PF  PEN INJ] 6 mL 1    Sig: ADMINISTER 12.5 MG UNDER THE SKIN 1 TIME A WEEK     Off-Protocol Failed - 04/16/2022  6:49 PM      Failed - Medication not assigned to a protocol, review manually.      Passed - Valid encounter within last 12 months    Recent Outpatient Visits           2 weeks ago Type 2 diabetes mellitus with diabetic nephropathy, with long-term current use of insulin (HCC)   [place] [facility] [place], Megan P, DO   1 month ago Type 2 diabetes mellitus with diabetic nephropathy, with long-term current use of insulin (HCC)   [place] [facility] [place], Megan P, DO   3 months ago Type 2 diabetes mellitus with diabetic nephropathy, with long-term current use of insulin (HCC)   [place] [facility] [place], Megan P, DO   4 months ago Routine general medical examination at a health care facility   [facility] [place], [place] P, DO   1 year ago Type 2 diabetes mellitus with diabetic nephropathy, with long-term current use of insulin [facility])   [place] [facility], Oralia Rud, DO       Future Appointments             In 3 weeks Laural Benes, Oralia Rud, DO [place] [facility], [facility]

## 2022-04-20 NOTE — Telephone Encounter (Signed)
Requested medication (s) are due for refill today: yes  Requested medication (s) are on the active medication list: yes  Last refill:  01/06/22  Future visit scheduled: yes  Notes to clinic:  Medication not assigned to a protocol, review manually.      Requested Prescriptions  Pending Prescriptions Disp Refills   MOUNJARO 12.5 MG/0.5ML Pen [Pharmacy Med Name: MOUNJARO 12.5MG /0.5ML PF  PEN INJ] 6 mL 1    Sig: ADMINISTER 12.5 MG UNDER THE SKIN 1 TIME A WEEK     Off-Protocol Failed - 04/17/2022  6:21 PM      Failed - Medication not assigned to a protocol, review manually.      Passed - Valid encounter within last 12 months    Recent Outpatient Visits           2 weeks ago Type 2 diabetes mellitus with diabetic nephropathy, with long-term current use of insulin (HCC)   [place] [facility] [place], Megan P, DO   2 months ago Type 2 diabetes mellitus with diabetic nephropathy, with long-term current use of insulin (HCC)   [place] [facility] Is[place], Megan P, DO   3 months ago Type 2 diabetes mellitus with diabetic nephropathy, with long-term current use of insulin (HCC)   [place] [facility] [place], Megan P, DO   4 months ago Routine general medical examination at a health care facility   [facility] [place], [place] P, DO   1 year ago Type 2 diabetes mellitus with diabetic nephropathy, with long-term current use of insulin [facility])   [place] [facility] [place], Oralia Rud, DO       Future Appointments             In 3 weeks Laural Benes, Oralia Rud, DO [place] [facility], [facility]

## 2022-05-12 ENCOUNTER — Encounter: Payer: Self-pay | Admitting: Family Medicine

## 2022-05-12 ENCOUNTER — Ambulatory Visit (INDEPENDENT_AMBULATORY_CARE_PROVIDER_SITE_OTHER): Payer: 59 | Admitting: Family Medicine

## 2022-05-12 VITALS — BP 129/83 | HR 75 | Temp 97.7°F | Ht 68.0 in | Wt 135.7 lb

## 2022-05-12 DIAGNOSIS — I129 Hypertensive chronic kidney disease with stage 1 through stage 4 chronic kidney disease, or unspecified chronic kidney disease: Secondary | ICD-10-CM

## 2022-05-12 DIAGNOSIS — K219 Gastro-esophageal reflux disease without esophagitis: Secondary | ICD-10-CM

## 2022-05-12 DIAGNOSIS — E785 Hyperlipidemia, unspecified: Secondary | ICD-10-CM

## 2022-05-12 DIAGNOSIS — E1121 Type 2 diabetes mellitus with diabetic nephropathy: Secondary | ICD-10-CM | POA: Diagnosis not present

## 2022-05-12 DIAGNOSIS — Z794 Long term (current) use of insulin: Secondary | ICD-10-CM | POA: Diagnosis not present

## 2022-05-12 LAB — BAYER DCA HB A1C WAIVED: HB A1C (BAYER DCA - WAIVED): 7.5 % — ABNORMAL HIGH (ref 4.8–5.6)

## 2022-05-12 MED ORDER — LOSARTAN POTASSIUM 25 MG PO TABS: ORAL_TABLET | ORAL | 1 refills | Status: DC

## 2022-05-12 MED ORDER — TIRZEPATIDE 12.5 MG/0.5ML ~~LOC~~ SOAJ: 12.5000 mg | SUBCUTANEOUS | 1 refills | Status: DC

## 2022-05-12 MED ORDER — ATORVASTATIN CALCIUM 20 MG PO TABS: 20.0000 mg | ORAL_TABLET | Freq: Every day | ORAL | 1 refills | Status: DC

## 2022-05-12 MED ORDER — METFORMIN HCL 1000 MG PO TABS: ORAL_TABLET | ORAL | 1 refills | Status: DC

## 2022-05-12 MED ORDER — FREESTYLE LIBRE SENSOR SYSTEM MISC: 4 refills | Status: DC

## 2022-05-12 MED ORDER — OMEPRAZOLE 40 MG PO CPDR: 40.0000 mg | DELAYED_RELEASE_CAPSULE | Freq: Every day | ORAL | 1 refills | Status: DC

## 2022-05-12 NOTE — Progress Notes (Unsigned)
BP 129/83   Pulse 75   Temp 97.7 F (36.5 C) (Oral)   Ht 5\' 8"  (1.727 m)   Wt 135 lb 11.2 oz (61.6 kg)   SpO2 94%   BMI 20.63 kg/m    Subjective:    Patient ID: Arthur [name], male    DOB: 10/16/1965, 57 y.o.   MRN: [id number]  HPI: Arthur [name] is a 57 y.o. male  Chief Complaint  Patient presents with   Diabetes   Gastroesophageal Reflux    Patient says he was prescribed a new medication for Indigestion and says he was doing better and now feels as if he is going backwards and says it is not solving the problem.    DIABETES Hypoglycemic episodes:no Polydipsia/polyuria: no Visual disturbance: no Chest pain: no Paresthesias: no Glucose Monitoring: yes Taking Insulin?: no Blood Pressure Monitoring: not checking Retinal Examination: Up to Date Foot Exam: Up to Date Diabetic Education: Completed Pneumovax: Up to Date Influenza: Up to Date Aspirin: yes  HYPERTENSION / HYPERLIPIDEMIA Satisfied with current treatment? yes Duration of hypertension: chronic BP monitoring frequency: not checking BP medication side effects: no Past BP meds: losartan Duration of hyperlipidemia: chronic Cholesterol medication side effects: yes Cholesterol supplements: none Past cholesterol medications: atorvastin Medication compliance: excellent compliance Aspirin: yes Recent stressors: no Recurrent headaches: no Visual changes: no Palpitations: no Dyspnea: no Chest pain: no Lower extremity edema: no Dizzy/lightheaded: no  GERD GERD control status: {Blank single:19197::"controlled","uncontrolled","better","worse","exacerbated","stable"} Satisfied with current treatment? {Blank single:19197::"yes","no"} Heartburn frequency:  Medication side effects: {Blank single:19197::"yes","no"}  Medication compliance: {Blank multiple:19196::"better","worse","stable","fluctuating"} Previous GERD medications: Antacid use frequency:   Duration:  Nature:  Location:  Heartburn  duration:  Alleviatiating factors:   Aggravating factors:  Dysphagia: {Blank single:19197::"yes","no"} Odynophagia:  {Blank single:19197::"yes","no"} Hematemesis: {Blank single:19197::"yes","no"} Blood in stool: {Blank single:19197::"yes","no"} EGD: {Blank single:19197::"yes","no"}  Relevant past medical, surgical, family and social history reviewed and updated as indicated. Interim medical history since our last visit reviewed. Allergies and medications reviewed and updated.  Review of Systems  Per HPI unless specifically indicated above     Objective:    BP 129/83   Pulse 75   Temp 97.7 F (36.5 C) (Oral)   Ht 5\' 8"  (1.727 m)   Wt 135 lb 11.2 oz (61.6 kg)   SpO2 94%   BMI 20.63 kg/m   Wt Readings from Last 3 Encounters:  05/12/22 135 lb 11.2 oz (61.6 kg)  04/09/22 128 lb (58.1 kg)  03/31/22 137 lb 9.6 oz (62.4 kg)    Physical Exam Vitals and nursing note reviewed.  Constitutional:      General: He is not in acute distress.    Appearance: Normal appearance. He is obese. He is not ill-appearing, toxic-appearing or diaphoretic.  HENT:     Head: Normocephalic and atraumatic.     Right Ear: External ear normal.     Left Ear: External ear normal.     Nose: Nose normal.     Mouth/Throat:     Mouth: Mucous membranes are moist.     Pharynx: Oropharynx is clear.  Eyes:     General: No scleral icterus.       Right eye: No discharge.        Left eye: No discharge.     Extraocular Movements: Extraocular movements intact.     Conjunctiva/sclera: Conjunctivae normal.     Pupils: Pupils are equal, round, and reactive to light.  Cardiovascular:     Rate and Rhythm: Normal rate and regular  rhythm.     Pulses: Normal pulses.     Heart sounds: Normal heart sounds. No murmur heard.    No friction rub. No gallop.  Pulmonary:     Effort: Pulmonary effort is normal. No respiratory distress.     Breath sounds: Normal breath sounds. No stridor. No wheezing, rhonchi or rales.   Chest:     Chest wall: No tenderness.  Musculoskeletal:        General: Normal range of motion.     Cervical back: Normal range of motion and neck supple.  Skin:    General: Skin is warm and dry.     Capillary Refill: Capillary refill takes less than 2 seconds.     Coloration: Skin is not jaundiced or pale.     Findings: No bruising, erythema, lesion or rash.  Neurological:     General: No focal deficit present.     Mental Status: He is alert and oriented to person, place, and time. Mental status is at baseline.  Psychiatric:        Mood and Affect: Mood normal.        Behavior: Behavior normal.        Thought Content: Thought content normal.        Judgment: Judgment normal.     Results for orders placed or performed in visit on 02/17/22  Hgb A1c w/o eAG  Result Value Ref Range   Hgb A1c MFr Bld 7.6 (H) 4.8 - 5.6 %      Assessment & Plan:   Problem List Items Addressed This Visit       Endocrine   Type 2 diabetes mellitus with renal manifestations (HCC) - Primary   Relevant Orders   Bayer DCA Hb A1c Waived   Comprehensive metabolic panel   Lipid Panel w/o Chol/HDL Ratio   CBC with Differential/Platelet     Follow up plan: No follow-ups on file.

## 2022-05-13 LAB — COMPREHENSIVE METABOLIC PANEL
ALT: 28 IU/L (ref 0–44)
AST: 24 IU/L (ref 0–40)
Albumin/Globulin Ratio: 2.2 (ref 1.2–2.2)
Albumin: 4.4 g/dL (ref 3.8–4.9)
Alkaline Phosphatase: 82 IU/L (ref 44–121)
BUN/Creatinine Ratio: 11 (ref 9–20)
BUN: 13 mg/dL (ref 6–24)
Bilirubin Total: 0.3 mg/dL (ref 0.0–1.2)
CO2: 21 mmol/L (ref 20–29)
Calcium: 10.1 mg/dL (ref 8.7–10.2)
Chloride: 102 mmol/L (ref 96–106)
Creatinine, Ser: 1.19 mg/dL (ref 0.76–1.27)
Globulin, Total: 2 g/dL (ref 1.5–4.5)
Glucose: 102 mg/dL — ABNORMAL HIGH (ref 70–99)
Potassium: 4.8 mmol/L (ref 3.5–5.2)
Sodium: 138 mmol/L (ref 134–144)
Total Protein: 6.4 g/dL (ref 6.0–8.5)
eGFR: 70 mL/min/{1.73_m2} (ref >59)

## 2022-05-13 LAB — CBC WITH DIFFERENTIAL/PLATELET
Basophils Absolute: 0 10*3/uL (ref 0.0–0.2)
Basos: 0 %
EOS (ABSOLUTE): 0.2 10*3/uL (ref 0.0–0.4)
Eos: 4 %
Hematocrit: 39.8 % (ref 37.5–51.0)
Hemoglobin: 12.9 g/dL — ABNORMAL LOW (ref 13.0–17.7)
Immature Grans (Abs): 0 10*3/uL (ref 0.0–0.1)
Immature Granulocytes: 0 %
Lymphocytes Absolute: 2.2 10*3/uL (ref 0.7–3.1)
Lymphs: 42 %
MCH: 27.6 pg (ref 26.6–33.0)
MCHC: 32.4 g/dL (ref 31.5–35.7)
MCV: 85 fL (ref 79–97)
Monocytes Absolute: 0.5 10*3/uL (ref 0.1–0.9)
Monocytes: 9 %
Neutrophils Absolute: 2.4 10*3/uL (ref 1.4–7.0)
Neutrophils: 45 %
Platelets: 312 10*3/uL (ref 150–450)
RBC: 4.67 x10E6/uL (ref 4.14–5.80)
RDW: 13 % (ref 11.6–15.4)
WBC: 5.2 10*3/uL (ref 3.4–10.8)

## 2022-05-13 LAB — LIPID PANEL W/O CHOL/HDL RATIO
Cholesterol, Total: 141 mg/dL (ref 100–199)
HDL: 54 mg/dL (ref >39)
LDL Chol Calc (NIH): 69 mg/dL (ref 0–99)
Triglycerides: 99 mg/dL (ref 0–149)
VLDL Cholesterol Cal: 18 mg/dL (ref 5–40)

## 2022-05-14 NOTE — Assessment & Plan Note (Signed)
Doing great with A1c of 7.3. Continue current regimen. Continue to monitor. Call with any concerns. Refills given.

## 2022-05-14 NOTE — Assessment & Plan Note (Signed)
Under good control on current regimen. Continue current regimen. Continue to monitor. Call with any concerns. Refills given. Labs drawn today.   

## 2022-05-14 NOTE — Assessment & Plan Note (Signed)
Doing great with A1c of 7.3. Continue current regimen. Continue to monitor. Call with any concerns. Refills given.  

## 2022-05-14 NOTE — Assessment & Plan Note (Addendum)
Not under good control. Will increase his omeprazole to 40mg  and get him into GI. Call with any concerns.

## 2022-05-21 ENCOUNTER — Other Ambulatory Visit: Payer: Self-pay

## 2022-05-21 ENCOUNTER — Ambulatory Visit (INDEPENDENT_AMBULATORY_CARE_PROVIDER_SITE_OTHER): Payer: 59 | Admitting: Physician Assistant

## 2022-05-21 ENCOUNTER — Encounter: Payer: Self-pay | Admitting: Physician Assistant

## 2022-05-21 VITALS — BP 132/83 | HR 73 | Temp 98.1°F | Ht 67.5 in | Wt 132.4 lb

## 2022-05-21 DIAGNOSIS — K219 Gastro-esophageal reflux disease without esophagitis: Secondary | ICD-10-CM | POA: Diagnosis not present

## 2022-05-21 DIAGNOSIS — R634 Abnormal weight loss: Secondary | ICD-10-CM

## 2022-05-21 NOTE — Patient Instructions (Signed)

## 2022-05-21 NOTE — Progress Notes (Signed)
Celso Amy, PA-C [address]  [address]  [place], [place] [address]  Main: [phone]  Fax: [phone]   Primary Care Physician: Dorcas Carrow, DO  Primary Gastroenterologist:  Dr. Lannette Donath / Celso Amy, PA-C    Chief Complaint  Patient presents with   New Patient (Initial Visit)    gerd    HPI: Arthur [name] is a 57 y.o. male presents for evaluation of worsening acid reflux.  He states he has history of acid reflux for over 5 years.  He has been taking omeprazole 20 Mg daily for 1.5 years which was not controlling his reflux.  He followed up with his PCP who increased medication to 40 mg daily.  He still has moderate indigestion which is worse at nighttime.  Also lingers throughout the day.  Feels acid coming up into his chest and throat.  Takes Pepto-Bismol which helps.  Some days he feels very nauseated.  He admits to belching and flatulence.  Denies dysphagia.  He has had a 60 pound weight loss in the past 2 years.  Was diagnosed with diabetes.  Has been on Ozempic which was discontinued.  Currently on Monjourno.  Previous weight was 200 pounds.  He is lost down to 132 pounds.  Denies any lower GI symptoms such as diarrhea, constipation, rectal bleeding, or melena.  Anoscopy done 01/2019 by Dr. Allegra Lai showed 1 diminutive adenomatous polyp removed from transverse colon.  Repeat colonoscopy in 7 years.  He has never had an EGD.  Current Outpatient Medications  Medication Sig Dispense Refill   ASPIRIN LOW DOSE 81 MG tablet TAKE 1 TABLET(81 MG) BY MOUTH DAILY 90 tablet 0   atorvastatin (LIPITOR) 20 MG tablet Take 1 tablet (20 mg total) by mouth daily. 90 tablet 1   Continuous Glucose Sensor (FREESTYLE LIBRE SENSOR SYSTEM) MISC AS DIRECTED 6 each 4   glucose blood test strip 1 each by Other route as needed for other. One touch ultra test strips. Use as instructed. 100 each 12   Insulin Pen Needle (NOVOFINE PLUS) 32G X 4 MM MISC Use one needle daily with  insulin 100 each 4   ipratropium (ATROVENT) 0.06 % nasal spray Place 2 sprays into both nostrils 4 (four) times daily. 15 mL 12   losartan (COZAAR) 25 MG tablet TAKE 1/2 TABLET(12.5 MG) BY MOUTH DAILY 45 tablet 1   metFORMIN (GLUCOPHAGE) 1000 MG tablet TAKE 1 TABLET(1000 MG) BY MOUTH TWICE DAILY 180 tablet 1   omeprazole (PRILOSEC) 40 MG capsule Take 1 capsule (40 mg total) by mouth daily. 90 capsule 1   sildenafil (VIAGRA) 100 MG tablet TAKE 1/2 TO 1 (ONE-HALF TO ONE) TABLET BY MOUTH ONCE DAILY AS NEEDED FOR ERECTILE DYSFUNCTION 10 tablet 12   simethicone (GAS-X) 80 MG chewable tablet Chew 1 tablet (80 mg total) by mouth every 6 (six) hours as needed for flatulence. 120 tablet 2   tirzepatide (MOUNJARO) 12.5 MG/0.5ML Pen Inject 12.5 mg into the skin once a week. 6 mL 1   No current facility-administered medications for this visit.    Allergies as of 05/21/2022 - Review Complete 05/21/2022  Allergen Reaction Noted   Codeine Nausea Only 11/03/2017   Hydrocodone Nausea Only 05/28/2015   Oxycodone Nausea Only 05/28/2015    Past Medical History:  Diagnosis Date   Diabetes mellitus with renal complications (HCC)    Erectile dysfunction associated with type 2 diabetes mellitus (HCC)    Exotropia    History of tobacco  abuse    Hyperlipidemia    Hypertension    Thoracic back pain     Past Surgical History:  Procedure Laterality Date   COLONOSCOPY WITH PROPOFOL N/A 01/25/2019   Procedure: COLONOSCOPY WITH PROPOFOL;  Surgeon: Toney Reil, MD;  Location: [facility] ENDOSCOPY;  Service: Gastroenterology;  Laterality: N/A;  Positive on 12/22/2018   HIP SURGERY     SHOULDER SURGERY Left    bone spurs removed    Review of Systems:    All systems reviewed and negative except where noted in HPI.   Physical Examination:   BP 132/83   Pulse 73   Temp 98.1 F (36.7 C)   Ht 5' 7.5" (1.715 m)   Wt 132 lb 6.4 oz (60.1 kg)   BMI 20.43 kg/m   General: Well-nourished, well-developed in no  acute distress.  Eyes: No icterus. Conjunctivae pink. Mouth: Oropharyngeal mucosa moist and pink , no lesions erythema or exudate. Lungs: Clear to auscultation bilaterally. Non-labored. Heart: Regular rate and rhythm, no murmurs rubs or gallops.  Abdomen: Bowel sounds are normal; Abdomen is Soft; No hepatosplenomegaly, masses or hernias;  No Abdominal Tenderness; No guarding or rebound tenderness. Extremities: No lower extremity edema. No clubbing or deformities. Neuro: Alert and oriented x 3.  Grossly intact. Skin: Warm and dry, no jaundice.   Psych: Alert and cooperative, normal mood and affect.   Imaging Studies: No results found.  Assessment and Plan:   Arthur [name] is a 57 y.o. y/o male presents for GERD and weight loss.  He is diabetic.  He was on Ozempic which was discontinued.  Currently on Monjourno.  He has lost from 200 pounds down to 132 pounds in the past 2 years.  Most likely due to diabetic medications, however need to rule out GI malignancy.  Negative colonoscopy last year.  Scheduling EGD given his history of persistent acid reflux (on PPI) over 5 years.  GERD  Scheduling EGD I discussed risks of EGD with patient to include risk of bleeding, perforation, and risk of sedation.   Patient expressed understanding and agrees to proceed with EGD.   Recommend Lifestyle Modifications to prevent Acid Reflux.  Rec. Avoid coffee, sodas, peppermint, citrus fruits, and spicey foods.  Avoid eating 2-3 hours before bedtime.   Continue omeprazole 40 Mg 1 tablet once daily.  Take 30 minutes before dinner or before bedtime.  Add famotidine 20 mg 1 tablet twice daily as needed.  Okay to take Tums, Mylanta, or other antacid as needed for breakthrough heartburn.  Weight Loss  Scheduling EGD I discussed risks of EGD with patient to include risk of bleeding, perforation, and risk of sedation.   Patient expressed understanding and agrees to proceed with EGD.   Negative Colonoscopy in  2023 - Up To Date.  Gas - Belching and Flatulence  Avoid sodas and carbonated drinks.  Avoid chewing gum.  Treat underlying acid reflux.     Celso Amy, PA-C  Follow up in 4 weeks after EGD procedure with TG

## 2022-06-10 ENCOUNTER — Other Ambulatory Visit: Payer: Self-pay | Admitting: Family Medicine

## 2022-06-10 NOTE — Telephone Encounter (Signed)
Unable to refill per protocol, Rx expired. Discontinued 11/25/21, dose change.  Requested Prescriptions  Pending Prescriptions Disp Refills   omeprazole (PRILOSEC) 20 MG capsule [Pharmacy Med Name: OMEPRAZOLE 20MG  CAPSULES] 90 capsule 0    Sig: TAKE 1 CAPSULE(20 MG) BY MOUTH DAILY     Gastroenterology: Proton Pump Inhibitors Passed - 06/10/2022  9:41 AM      Passed - Valid encounter within last 12 months    Recent Outpatient Visits           4 weeks ago Type 2 diabetes mellitus with diabetic nephropathy, with long-term current use of insulin (HCC)   [facility] [place], Megan P, DO   2 months ago Type 2 diabetes mellitus with diabetic nephropathy, with long-term current use of insulin (HCC)   [facility] [place], Megan P, DO   3 months ago Type 2 diabetes mellitus with diabetic nephropathy, with long-term current use of insulin (HCC)   [facility] [place], Megan P, DO   5 months ago Type 2 diabetes mellitus with diabetic nephropathy, with long-term current use of insulin (HCC)   [place] [facility] [place], Megan P, DO   6 months ago Routine general medical examination at a health care facility   [facility] Dorcas Carrow, DO       Future Appointments             In 2 months Dorcas Carrow, DO [place] [facility]   In 2 months Celso Amy, PA-C [facility]

## 2022-06-12 ENCOUNTER — Encounter: Payer: Self-pay | Admitting: Family Medicine

## 2022-07-07 ENCOUNTER — Encounter: Payer: Self-pay | Admitting: Gastroenterology

## 2022-07-13 ENCOUNTER — Encounter: Payer: Self-pay | Admitting: Gastroenterology

## 2022-07-14 ENCOUNTER — Encounter
Admission: RE | Disposition: A | Discharge status: Home / Self Care | Payer: Self-pay | Source: Home / Self Care | Attending: Gastroenterology

## 2022-07-14 ENCOUNTER — Ambulatory Visit
Admission: RE | Admit: 2022-07-14 | Discharge: 2022-07-14 | Disposition: A | Discharge status: Home / Self Care | Payer: 59 | Attending: Gastroenterology | Admitting: Gastroenterology

## 2022-07-14 ENCOUNTER — Ambulatory Visit: Payer: 59 | Admitting: Anesthesiology

## 2022-07-14 ENCOUNTER — Encounter: Payer: Self-pay | Admitting: Gastroenterology

## 2022-07-14 DIAGNOSIS — Z7985 Long-term (current) use of injectable non-insulin antidiabetic drugs: Secondary | ICD-10-CM | POA: Diagnosis not present

## 2022-07-14 DIAGNOSIS — K219 Gastro-esophageal reflux disease without esophagitis: Secondary | ICD-10-CM | POA: Insufficient documentation

## 2022-07-14 DIAGNOSIS — I10 Essential (primary) hypertension: Secondary | ICD-10-CM | POA: Diagnosis not present

## 2022-07-14 DIAGNOSIS — E119 Type 2 diabetes mellitus without complications: Secondary | ICD-10-CM | POA: Diagnosis not present

## 2022-07-14 DIAGNOSIS — Z6820 Body mass index (BMI) 20.0-20.9, adult: Secondary | ICD-10-CM | POA: Insufficient documentation

## 2022-07-14 DIAGNOSIS — Z87891 Personal history of nicotine dependence: Secondary | ICD-10-CM | POA: Insufficient documentation

## 2022-07-14 DIAGNOSIS — R634 Abnormal weight loss: Secondary | ICD-10-CM | POA: Insufficient documentation

## 2022-07-14 HISTORY — PX: ESOPHAGOGASTRODUODENOSCOPY (EGD) WITH PROPOFOL: SHX5813

## 2022-07-14 LAB — GLUCOSE, CAPILLARY: Glucose-Capillary: 113 mg/dL — ABNORMAL HIGH (ref 70–99)

## 2022-07-14 SURGERY — ESOPHAGOGASTRODUODENOSCOPY (EGD) WITH PROPOFOL
Anesthesia: General

## 2022-07-14 MED ORDER — SODIUM CHLORIDE 0.9 % IV SOLN: INTRAVENOUS | Status: DC

## 2022-07-14 MED ORDER — PROPOFOL 500 MG/50ML IV EMUL
INTRAVENOUS | Status: DC | PRN
  Administered 2022-07-14: 50 mg via INTRAVENOUS

## 2022-07-14 NOTE — Anesthesia Preprocedure Evaluation (Signed)
Anesthesia Evaluation  Patient identified by MRN, date of birth, ID band Patient awake    Reviewed: Allergy & Precautions, NPO status , Patient's Chart, lab work & pertinent test results  History of Anesthesia Complications Negative for: history of anesthetic complications  Airway Mallampati: III  TM Distance: >3 FB Neck ROM: full    Dental  (+) Chipped   Pulmonary neg shortness of breath, former smoker   Pulmonary exam normal        Cardiovascular Exercise Tolerance: Good hypertension, (-) angina Normal cardiovascular exam     Neuro/Psych negative neurological ROS  negative psych ROS   GI/Hepatic Neg liver ROS,GERD  Controlled,,  Endo/Other  diabetes, Type 2    Renal/GU Renal disease  negative genitourinary   Musculoskeletal   Abdominal   Peds  Hematology negative hematology ROS (+)   Anesthesia Other Findings Past Medical History: No date: Diabetes mellitus with renal complications (HCC) No date: Erectile dysfunction associated with type 2 diabetes  mellitus (HCC) No date: Exotropia No date: History of tobacco abuse No date: Hyperlipidemia No date: Hypertension No date: Thoracic back pain  Past Surgical History: 01/25/2019: COLONOSCOPY WITH PROPOFOL; N/A     Comment:  Procedure: COLONOSCOPY WITH PROPOFOL;  Surgeon: Toney Reil, MD;  Location: [facility] ENDOSCOPY;  Service:               Gastroenterology;  Laterality: N/A;  Positive on               12/22/2018 No date: HIP SURGERY No date: SHOULDER SURGERY; Left     Comment:  bone spurs removed  BMI    Body Mass Index: 20.67 kg/m      Reproductive/Obstetrics negative OB ROS                             Anesthesia Physical Anesthesia Plan  ASA: 3  Anesthesia Plan: General   Post-op Pain Management:    Induction: Intravenous  PONV Risk Score and Plan: Propofol infusion and TIVA  Airway Management  Planned: Natural Airway and Nasal Cannula  Additional Equipment:   Intra-op Plan:   Post-operative Plan:   Informed Consent: I have reviewed the patients History and Physical, chart, labs and discussed the procedure including the risks, benefits and alternatives for the proposed anesthesia with the patient or authorized representative who has indicated his/her understanding and acceptance.     Dental Advisory Given  Plan Discussed with: Anesthesiologist, CRNA and Surgeon  Anesthesia Plan Comments: (Patient consented for risks of anesthesia including but not limited to:  - adverse reactions to medications - risk of airway placement if required - damage to eyes, teeth, lips or other oral mucosa - nerve damage due to positioning  - sore throat or hoarseness - Damage to heart, brain, nerves, lungs, other parts of body or loss of life  Patient voiced understanding.)       Anesthesia Quick Evaluation

## 2022-07-14 NOTE — Anesthesia Postprocedure Evaluation (Signed)
Anesthesia Post Note  Patient: Arthur [name]  Procedure(s) Performed: ESOPHAGOGASTRODUODENOSCOPY (EGD) WITH PROPOFOL  Patient location during evaluation: Endoscopy Anesthesia Type: General Level of consciousness: awake and alert Pain management: pain level controlled Vital Signs Assessment: post-procedure vital signs reviewed and stable Respiratory status: spontaneous breathing, nonlabored ventilation, respiratory function stable and patient connected to nasal cannula oxygen Cardiovascular status: blood pressure returned to baseline and stable Postop Assessment: no apparent nausea or vomiting Anesthetic complications: no   No notable events documented.   Last Vitals:  Vitals:   07/14/22 0954 07/14/22 1005  BP: 114/84 121/85  Pulse: 79 68  Resp: 12 17  Temp:    SpO2: 100% 100%    Last Pain:  Vitals:   07/14/22 0954  TempSrc:   PainSc: 0-No pain                 Cleda Mccreedy Laraine Samet

## 2022-07-14 NOTE — H&P (Signed)
Wyline Mood, MD [address], [place], [place], [address] [address], [address], [place], [place], [address] Phone: [phone]  Fax: [phone]  Primary Care Physician:  Dorcas Carrow, DO   Pre-Procedure History & Physical: HPI:  Arthur [name] is a 57 y.o. male is here for an endoscopy    Past Medical History:  Diagnosis Date   Diabetes mellitus with renal complications [facility])    Erectile dysfunction associated with type 2 diabetes mellitus (HCC)    Exotropia    History of tobacco abuse    Hyperlipidemia    Hypertension    Thoracic back pain     Past Surgical History:  Procedure Laterality Date   COLONOSCOPY WITH PROPOFOL N/A 01/25/2019   Procedure: COLONOSCOPY WITH PROPOFOL;  Surgeon: Toney Reil, MD;  Location: [facility] ENDOSCOPY;  Service: Gastroenterology;  Laterality: N/A;  Positive on 12/22/2018   HIP SURGERY     SHOULDER SURGERY Left    bone spurs removed    Prior to Admission medications   Medication Sig Start Date End Date Taking? Authorizing Provider  ASPIRIN LOW DOSE 81 MG tablet TAKE 1 TABLET(81 MG) BY MOUTH DAILY 02/02/22  Yes Johnson, Megan P, DO  losartan (COZAAR) 25 MG tablet TAKE 1/2 TABLET(12.5 MG) BY MOUTH DAILY 05/12/22  Yes Johnson, Megan P, DO  metFORMIN (GLUCOPHAGE) 1000 MG tablet TAKE 1 TABLET(1000 MG) BY MOUTH TWICE DAILY 05/12/22  Yes Johnson, Megan P, DO  atorvastatin (LIPITOR) 20 MG tablet Take 1 tablet (20 mg total) by mouth daily. 05/12/22   Olevia Perches P, DO  Continuous Glucose Sensor (FREESTYLE LIBRE SENSOR SYSTEM) MISC AS DIRECTED 05/12/22   Olevia Perches P, DO  glucose blood test strip 1 each by Other route as needed for other. One touch ultra test strips. Use as instructed. 11/25/21   Johnson, Megan P, DO  Insulin Pen Needle (NOVOFINE PLUS) 32G X 4 MM MISC Use one needle daily with insulin 06/24/18   Johnson, Megan P, DO  ipratropium (ATROVENT) 0.06 % nasal spray Place 2 sprays into both nostrils 4 (four) times  daily. 03/11/21   Johnson, Megan P, DO  omeprazole (PRILOSEC) 40 MG capsule Take 1 capsule (40 mg total) by mouth daily. 05/12/22   Johnson, Megan P, DO  sildenafil (VIAGRA) 100 MG tablet TAKE 1/2 TO 1 (ONE-HALF TO ONE) TABLET BY MOUTH ONCE DAILY AS NEEDED FOR ERECTILE DYSFUNCTION 11/25/21   Olevia Perches P, DO  simethicone (GAS-X) 80 MG chewable tablet Chew 1 tablet (80 mg total) by mouth every 6 (six) hours as needed for flatulence. 02/17/22   Johnson, Megan P, DO  tirzepatide [facility]) 12.5 MG/0.5ML Pen Inject 12.5 mg into the skin once a week. 05/12/22   Olevia Perches P, DO    Allergies as of 05/21/2022 - Review Complete 05/21/2022  Allergen Reaction Noted   Codeine Nausea Only 11/03/2017   Hydrocodone Nausea Only 05/28/2015   Oxycodone Nausea Only 05/28/2015    Family History  Problem Relation Age of Onset   Diabetes Father    Lung disease Mother    Diabetes Sister    Diabetes Maternal Aunt     Social History   Socioeconomic History   Marital status: Married    Spouse name: Not on file   Number of children: Not on file   Years of education: Not on file   Highest education level: Not on file  Occupational History   Not on file  Tobacco Use  Smoking status: Former    Packs/day: 1.00    Years: 20.00    Additional pack years: 0.00    Total pack years: 20.00    Types: Cigarettes    Quit date: 01/05/1986    Years since quitting: 36.5   Smokeless tobacco: Never  Vaping Use   Vaping Use: Never used  Substance and Sexual Activity   Alcohol use: No   Drug use: No   Sexual activity: Yes  Other Topics Concern   Not on file  Social History Narrative   Not on file   Social Determinants of Health   Financial Resource Strain: Not on file  Food Insecurity: Not on file  Transportation Needs: Not on file  Physical Activity: Not on file  Stress: Not on file  Social Connections: Not on file  Intimate Partner Violence: Not on file    Review of Systems: See HPI, otherwise  negative ROS  Physical Exam: BP (!) 136/90   Pulse 83   Temp (!) 97 F (36.1 C) (Temporal)   Resp 17   Ht 5\' 7"  (1.702 m)   Wt 59.9 kg   SpO2 100%   BMI 20.67 kg/m  General:   Alert,  pleasant and cooperative in NAD Head:  Normocephalic and atraumatic. Neck:  Supple; no masses or thyromegaly. Lungs:  Clear throughout to auscultation, normal respiratory effort.    Heart:  +S1, +S2, Regular rate and rhythm, No edema. Abdomen:  Soft, nontender and nondistended. Normal bowel sounds, without guarding, and without rebound.   Neurologic:  Alert and  oriented x4;  grossly normal neurologically.  Impression/Plan: Arthur [name] is here for an endoscopy  to be performed for  evaluation of weight loss    Risks, benefits, limitations, and alternatives regarding endoscopy have been reviewed with the patient.  Questions have been answered.  All parties agreeable.   Wyline Mood, MD  07/14/2022, 9:32 AM

## 2022-07-14 NOTE — Op Note (Signed)
[facility] Gastroenterology Patient Name: Arthur [name] Procedure Date: 07/14/2022 9:27 AM MRN: [id number] Account #: [id number] Date of Birth: 05-Feb-1965 Admit Type: Outpatient Age: 57 Room: [facility] ENDO ROOM 2 Gender: Male Note Status: Finalized Instrument Name: Upper Endoscope [id number] Procedure:             Upper GI endoscopy Indications:           Follow-up of gastro-esophageal reflux disease Providers:             Wyline Mood MD, MD Referring MD:          Dorcas Carrow (Referring MD) Medicines:             Monitored Anesthesia Care Complications:         No immediate complications. Procedure:             Pre-Anesthesia Assessment:                        - Prior to the procedure, a History and Physical was                         performed, and patient medications, allergies and                         sensitivities were reviewed. The patient's tolerance                         of previous anesthesia was reviewed.                        - The risks and benefits of the procedure and the                         sedation options and risks were discussed with the                         patient. All questions were answered and informed                         consent was obtained.                        - ASA Grade Assessment: II - A patient with mild                         systemic disease.                        After obtaining informed consent, the endoscope was                         passed under direct vision. Throughout the procedure,                         the patient's blood pressure, pulse, and oxygen                         saturations were monitored continuously. The Endoscope  was introduced through the mouth, and advanced to the                         third part of duodenum. The upper GI endoscopy was                         accomplished with ease. The patient tolerated the                         procedure  well. Findings:      The esophagus was normal.      The stomach was normal.      The examined duodenum was normal. Impression:            - Normal esophagus.                        - Normal stomach.                        - Normal examined duodenum.                        - No specimens collected. Recommendation:        - Discharge patient to home (with escort).                        - Resume previous diet.                        - Continue present medications.                        - Return to GI office as previously scheduled.                        - Weight loss and GERD could be related to [facility] -                         if concersn for abnormal weight loss persists consider                         CT chest/abdomen and pelvis Procedure Code(s):     --- Professional ---                        [phone], Esophagogastroduodenoscopy, flexible,                         transoral; diagnostic, including collection of                         specimen(s) by brushing or washing, when performed                         (separate procedure) Diagnosis Code(s):     --- Professional ---                        K21.9, Gastro-esophageal reflux disease without  esophagitis CPT copyright 2022 American Medical Association. All rights reserved. The codes documented in this report are preliminary and upon coder review may  be revised to meet current compliance requirements. Wyline Mood, MD Wyline Mood MD, MD 07/14/2022 9:43:25 AM This report has been signed electronically. Number of Addenda: 0 Note Initiated On: 07/14/2022 9:27 AM Estimated Blood Loss:  Estimated blood loss: none.      [facility]

## 2022-07-14 NOTE — Transfer of Care (Signed)
Immediate Anesthesia Transfer of Care Note  Patient: Arthur [name]  Procedure(s) Performed: ESOPHAGOGASTRODUODENOSCOPY (EGD) WITH PROPOFOL  Patient Location: PACU  Anesthesia Type:General  Level of Consciousness: awake and alert   Airway & Oxygen Therapy: Patient Spontanous Breathing and Patient connected to nasal cannula oxygen  Post-op Assessment: Report given to RN and Post -op Vital signs reviewed and stable  Post vital signs: Reviewed and stable  Last Vitals:  Vitals Value Taken Time  BP 119/86 07/14/22 0944  Temp 35.9 C 07/14/22 0944  Pulse 81 07/14/22 0945  Resp 13 07/14/22 0945  SpO2 100 % 07/14/22 0945  Vitals shown include unvalidated device data.  Last Pain:  Vitals:   07/14/22 0944  TempSrc: Temporal  PainSc: Asleep         Complications: No notable events documented.

## 2022-07-15 ENCOUNTER — Encounter: Payer: Self-pay | Admitting: Gastroenterology

## 2022-07-29 ENCOUNTER — Ambulatory Visit (INDEPENDENT_AMBULATORY_CARE_PROVIDER_SITE_OTHER): Payer: 59 | Admitting: Physician Assistant

## 2022-07-29 ENCOUNTER — Encounter: Payer: Self-pay | Admitting: Physician Assistant

## 2022-07-29 VITALS — BP 134/90 | HR 84 | Ht 67.0 in | Wt 132.2 lb

## 2022-07-29 DIAGNOSIS — S60551A Superficial foreign body of right hand, initial encounter: Secondary | ICD-10-CM

## 2022-07-29 NOTE — Patient Instructions (Signed)
   Please go to [facility] for your xray  Please go to the check in desk to let them know you are there for imaging  Please do not pick at the area or try to remove anything for now  You can keep the area moisturized with Aquaphor and vaseline and use gloves and safety goggles while gardening to prevent further injury

## 2022-07-29 NOTE — Progress Notes (Signed)
Acute Office Visit   Patient: Arthur [name]   DOB: 1965-07-27   57 y.o. Male  MRN: [id number] Visit Date: 07/29/2022  Today's healthcare provider: Oswaldo Conroy Yarexi Pawlicki, PA-C  Introduced myself to the patient as a Secondary school teacher and provided education on APPs in clinical practice.    Chief Complaint  Patient presents with   Hand Problem    Patient says he has a foreign object in his R hand and says he was seen in the Emergency Services and was informed that he would need to have an ultrasound to see what the object in his R hand may be. Patient says he was doing something yard work and noticed something was in his R hand and says the skin has grown over the area and he would not like for it to stay there cause it has been at least a month.    Subjective    HPI HPI     Hand Problem    Additional comments: Patient says he has a foreign object in his R hand and says he was seen in the Emergency Services and was informed that he would need to have an ultrasound to see what the object in his R hand may be. Patient says he was doing something yard work and noticed something was in his R hand and says the skin has grown over the area and he would not like for it to stay there cause it has been at least a month.       Last edited by Malen Gauze, CMA on 07/29/2022  9:38 AM.       He reports concerns that something is in his right hand - right index finger  Thinks it has been there for about a month He reports he went to UC but they did not take it out and said he should go to Derm  He thinks it may be a splinter from when he was doing yard work He states he has tried to dig the object out a few times at home but this has not led to relief He reports some tenderness to the area with pressure        Medications: Outpatient Medications Prior to Visit  Medication Sig   ASPIRIN LOW DOSE 81 MG tablet TAKE 1 TABLET(81 MG) BY MOUTH DAILY   atorvastatin (LIPITOR) 20 MG tablet Take 1 tablet  (20 mg total) by mouth daily.   Continuous Glucose Sensor (FREESTYLE LIBRE SENSOR SYSTEM) MISC AS DIRECTED   glucose blood test strip 1 each by Other route as needed for other. One touch ultra test strips. Use as instructed.   Insulin Pen Needle (NOVOFINE PLUS) 32G X 4 MM MISC Use one needle daily with insulin   ipratropium (ATROVENT) 0.06 % nasal spray Place 2 sprays into both nostrils 4 (four) times daily.   losartan (COZAAR) 25 MG tablet TAKE 1/2 TABLET(12.5 MG) BY MOUTH DAILY   metFORMIN (GLUCOPHAGE) 1000 MG tablet TAKE 1 TABLET(1000 MG) BY MOUTH TWICE DAILY   omeprazole (PRILOSEC) 40 MG capsule Take 1 capsule (40 mg total) by mouth daily.   sildenafil (VIAGRA) 100 MG tablet TAKE 1/2 TO 1 (ONE-HALF TO ONE) TABLET BY MOUTH ONCE DAILY AS NEEDED FOR ERECTILE DYSFUNCTION   simethicone (GAS-X) 80 MG chewable tablet Chew 1 tablet (80 mg total) by mouth every 6 (six) hours as needed for flatulence.   tirzepatide (MOUNJARO) 12.5 MG/0.5ML Pen Inject 12.5 mg into the  skin once a week.   No facility-administered medications prior to visit.    Review of Systems  Skin:        Concern for superficial foreign object in right index finger          Objective    BP (!) 134/90   Pulse 84   Ht 5\' 7"  (1.702 m)   Wt 132 lb 3.2 oz (60 kg)   SpO2 98%   BMI 20.71 kg/m      Physical Exam Vitals reviewed.  Constitutional:      General: He is awake.     Appearance: Normal appearance. He is well-developed and well-groomed.  HENT:     Head: Normocephalic and atraumatic.  Musculoskeletal:       Hands:  Skin:    General: Skin is warm and dry.  Neurological:     Mental Status: He is alert.  Psychiatric:        Behavior: Behavior is cooperative.       No results found for any visits on 07/29/22.  Assessment & Plan      No follow-ups on file.      Problem List Items Addressed This Visit   None Visit Diagnoses     Foreign body, hand, superficial, right, initial encounter    -   Primary Acute, ongoing  Patient reports injury during gardening about 2 months ago and is concerned he has retained foreign body in his finger  Will get DG hand to rule out radio-opaque foreign body - results to dictate further management  Cannot rule out that his concern is caused by callous/corn formation over the area vs foreign body  Discussed potential interventions to include: shave excision with PCP if they are amenable, referral to General surgery or hand specialty as needed Follow up as needed for persistent or progressing symptoms     Relevant Orders   DG Hand Complete Right        No follow-ups on file.   I, Deletha Jaffee E Brax Walen, PA-C, have reviewed all documentation for this visit. The documentation on 07/29/22 for the exam, diagnosis, procedures, and orders are all accurate and complete.   Jacquelin Hawking, MHS, PA-C [facility]

## 2022-07-30 ENCOUNTER — Ambulatory Visit
Admission: RE | Admit: 2022-07-30 | Discharge: 2022-07-30 | Disposition: A | Discharge status: Home / Self Care | Payer: 59 | Attending: Physician Assistant | Admitting: Physician Assistant

## 2022-07-30 ENCOUNTER — Ambulatory Visit
Admission: RE | Admit: 2022-07-30 | Discharge: 2022-07-30 | Disposition: A | Discharge status: Home / Self Care | Payer: 59 | Source: Ambulatory Visit | Attending: Physician Assistant | Admitting: Physician Assistant

## 2022-07-30 DIAGNOSIS — S60551A Superficial foreign body of right hand, initial encounter: Secondary | ICD-10-CM

## 2022-08-10 NOTE — Progress Notes (Signed)
Your xray is back and it does not appear that there is anything in your finger that would show up on xray. This does not necessarily mean that a splinter or other object is not there though. If you are still having concerns about something remaining in your finger, I recommend that we send you to a hand specialist for them to try to remove anything that may be there. Let us know if you would like this referral placed.

## 2022-08-12 ENCOUNTER — Other Ambulatory Visit: Payer: Self-pay

## 2022-08-13 ENCOUNTER — Other Ambulatory Visit: Payer: Self-pay

## 2022-08-13 ENCOUNTER — Encounter: Payer: Self-pay | Admitting: Family Medicine

## 2022-08-13 ENCOUNTER — Telehealth: Payer: Self-pay | Admitting: Family Medicine

## 2022-08-13 ENCOUNTER — Ambulatory Visit (INDEPENDENT_AMBULATORY_CARE_PROVIDER_SITE_OTHER): Payer: 59 | Admitting: Family Medicine

## 2022-08-13 VITALS — BP 126/76 | HR 76 | Temp 97.7°F

## 2022-08-13 DIAGNOSIS — Z794 Long term (current) use of insulin: Secondary | ICD-10-CM | POA: Diagnosis not present

## 2022-08-13 DIAGNOSIS — I129 Hypertensive chronic kidney disease with stage 1 through stage 4 chronic kidney disease, or unspecified chronic kidney disease: Secondary | ICD-10-CM | POA: Diagnosis not present

## 2022-08-13 DIAGNOSIS — S60551A Superficial foreign body of right hand, initial encounter: Secondary | ICD-10-CM

## 2022-08-13 DIAGNOSIS — E1121 Type 2 diabetes mellitus with diabetic nephropathy: Secondary | ICD-10-CM | POA: Diagnosis not present

## 2022-08-13 LAB — BAYER DCA HB A1C WAIVED: HB A1C (BAYER DCA - WAIVED): 8 % — ABNORMAL HIGH (ref 4.8–5.6)

## 2022-08-13 MED ORDER — TIRZEPATIDE 12.5 MG/0.5ML ~~LOC~~ SOAJ: 12.5000 mg | SUBCUTANEOUS | 1 refills | Status: DC

## 2022-08-13 NOTE — Telephone Encounter (Signed)
Pt caled in says the freestyle libre version 1 isnt available at his pharmacy([facility]) so he is asking can he be prescrible the freestyle linre 2 version that they do have.

## 2022-08-13 NOTE — Assessment & Plan Note (Addendum)
Up slightly with A1c of 8 up from 7.5. Has been taking 10mg  rather than 12.5mg  of mounjaro due to availability. He thinks he can get it in [place]. Will send in and recheck in 6 weeks. Call with any concerns.

## 2022-08-13 NOTE — Assessment & Plan Note (Signed)
Better on recheck. Continue current regimen. Continue to monitor.  

## 2022-08-13 NOTE — Progress Notes (Signed)
BP 126/76   Pulse 76   Temp 97.7 F (36.5 C) (Oral)   SpO2 99%    Subjective:    Patient ID: Arthur [name], male    DOB: [birth year], 57 y.o.   MRN: [id number]  HPI: Arthur [name] is a 57 y.o. male  Chief Complaint  Patient presents with   Diabetes    Eye exam scheduled    DIABETES Hypoglycemic episodes:no Polydipsia/polyuria: no Visual disturbance: no Chest pain: no Paresthesias: no Glucose Monitoring: yes  Accucheck frequency: continuous Taking Insulin?: no Blood Pressure Monitoring: not checking Retinal Examination: Up to Date Foot Exam: Up to Date Diabetic Education: Completed Pneumovax: Up to Date Influenza: Not up to Date Aspirin: yes  HYPERTENSION- did not take his blood pressure medicine today  Hypertension status: stable  Satisfied with current treatment? yes Duration of hypertension: chronic BP monitoring frequency:  not checking BP medication side effects:  no Medication compliance: good compliance Previous BP meds:losartan Aspirin: yes Recurrent headaches: no Visual changes: no Palpitations: no Dyspnea: no Chest pain: no Lower extremity edema: no Dizzy/lightheaded: no  Hand is still not better. Would like to see hand specialist  Relevant past medical, surgical, family and social history reviewed and updated as indicated. Interim medical history since our last visit reviewed. Allergies and medications reviewed and updated.  Review of Systems  Constitutional: Negative.   Respiratory: Negative.    Cardiovascular: Negative.   Gastrointestinal: Negative.   Musculoskeletal: Negative.   Psychiatric/Behavioral: Negative.      Per HPI unless specifically indicated above     Objective:    BP 126/76   Pulse 76   Temp 97.7 F (36.5 C) (Oral)   SpO2 99%   Wt Readings from Last 3 Encounters:  07/29/22 132 lb 3.2 oz (60 kg)  07/14/22 132 lb (59.9 kg)  05/21/22 132 lb 6.4 oz (60.1 kg)    Physical Exam Vitals and nursing note  reviewed.  Constitutional:      General: He is not in acute distress.    Appearance: Normal appearance. He is not ill-appearing, toxic-appearing or diaphoretic.  HENT:     Head: Normocephalic and atraumatic.     Right Ear: External ear normal.     Left Ear: External ear normal.     Nose: Nose normal.     Mouth/Throat:     Mouth: Mucous membranes are moist.     Pharynx: Oropharynx is clear.  Eyes:     General: No scleral icterus.       Right eye: No discharge.        Left eye: No discharge.     Extraocular Movements: Extraocular movements intact.     Conjunctiva/sclera: Conjunctivae normal.     Pupils: Pupils are equal, round, and reactive to light.  Cardiovascular:     Rate and Rhythm: Normal rate and regular rhythm.     Pulses: Normal pulses.     Heart sounds: Normal heart sounds. No murmur heard.    No friction rub. No gallop.  Pulmonary:     Effort: Pulmonary effort is normal. No respiratory distress.     Breath sounds: Normal breath sounds. No stridor. No wheezing, rhonchi or rales.  Chest:     Chest wall: No tenderness.  Musculoskeletal:        General: Swelling (R index finger MCP, tenderness to palpation) present. Normal range of motion.     Cervical back: Normal range of motion and neck supple.  Skin:  General: Skin is warm and dry.     Capillary Refill: Capillary refill takes less than 2 seconds.     Coloration: Skin is not jaundiced or pale.     Findings: No bruising, erythema, lesion or rash.  Neurological:     General: No focal deficit present.     Mental Status: He is alert and oriented to person, place, and time. Mental status is at baseline.  Psychiatric:        Mood and Affect: Mood normal.        Behavior: Behavior normal.        Thought Content: Thought content normal.        Judgment: Judgment normal.     Results for orders placed or performed during the hospital encounter of 07/14/22  Glucose, capillary  Result Value Ref Range    Glucose-Capillary 113 (H) 70 - 99 mg/dL      Assessment & Plan:   Problem List Items Addressed This Visit       Endocrine   Type 2 diabetes mellitus with renal manifestations (HCC) - Primary    Up slightly with A1c of 8 up from 7.5. Has been taking 10mg  rather than 12.5mg  of mounjaro due to availability. He thinks he can get it in [place]. Will send in and recheck in 6 weeks. Call with any concerns.       Relevant Medications   tirzepatide (MOUNJARO) 12.5 MG/0.5ML Pen   Other Relevant Orders   Bayer DCA Hb A1c Waived     Genitourinary   Benign hypertensive renal disease    Better on recheck. Continue current regimen. Continue to monitor.       Other Visit Diagnoses     Foreign body, hand, superficial, right, initial encounter       Referral to hand specialist made today.   Relevant Orders   Ambulatory referral to Hand Surgery        Follow up plan: Return in about 6 weeks (around 09/24/2022).

## 2022-08-14 ENCOUNTER — Ambulatory Visit: Payer: 59 | Admitting: Physician Assistant

## 2022-08-14 ENCOUNTER — Encounter: Payer: Self-pay | Admitting: Physician Assistant

## 2022-08-14 ENCOUNTER — Ambulatory Visit (INDEPENDENT_AMBULATORY_CARE_PROVIDER_SITE_OTHER): Payer: 59 | Admitting: Physician Assistant

## 2022-08-14 VITALS — BP 128/82 | HR 90 | Temp 97.7°F | Ht 67.5 in | Wt 131.0 lb

## 2022-08-14 DIAGNOSIS — R1013 Epigastric pain: Secondary | ICD-10-CM

## 2022-08-14 DIAGNOSIS — K219 Gastro-esophageal reflux disease without esophagitis: Secondary | ICD-10-CM

## 2022-08-14 DIAGNOSIS — R634 Abnormal weight loss: Secondary | ICD-10-CM | POA: Diagnosis not present

## 2022-08-14 MED ORDER — FREESTYLE LIBRE 2 SENSOR MISC: 12 refills | Status: DC

## 2022-08-14 NOTE — Progress Notes (Signed)
Celso Amy, PA-C [address]  [address]  [place], [place] [address]  Main: [phone]  Fax: [phone]   Primary Care Physician: Dorcas Carrow, DO  Primary Gastroenterologist:  Celso Amy, PA-C / Dr. Wyline Mood    CC: F/U Weight Loss, Nausea, Abdominal Pain, Acid Reflux  HPI: Arthur [name] is a 57 y.o. male returns for follow-up of acid reflux and weight loss.  Possibly due to Ozempic or Mounjaro.  Since 05/2022 his weight is down 1 pound from 132-131 today.  Weight is stable.  EGD 07/14/2022 by Dr. Tobi Bastos was normal.  No biopsies.  Taking omeprazole 40 Mg every morning and famotidine 20 Mg twice daily.  Tums as needed.  He states he has history of acid reflux for over 5 years.  Currently taking omeprazole 40 mg once daily which has helped his indigestion.He has had a 60 pound weight loss in the past 2 years.  Was diagnosed with diabetes.  Started on Ozempic 1 year ago which was discontinued.  Currently on Monjourno.  Starting weight was 200 pounds.  He is lost down to 131 pounds today.  Overall GI symptoms have improved.  He still has episodes of nausea, abdominal discomfort and decreased appetite.  Takes occasional Pepto-Bismol which helps.   Denies any lower GI symptoms such as diarrhea, constipation, rectal bleeding, or melena.   Colonoscopy done 01/2019 by Dr. Allegra Lai showed 1 diminutive adenomatous polyp removed from transverse colon.  Repeat colonoscopy in 7 years.  He has never had an EGD.  Current Outpatient Medications  Medication Sig Dispense Refill   ASPIRIN LOW DOSE 81 MG tablet TAKE 1 TABLET(81 MG) BY MOUTH DAILY 90 tablet 0   atorvastatin (LIPITOR) 20 MG tablet Take 1 tablet (20 mg total) by mouth daily. 90 tablet 1   Continuous Glucose Sensor (FREESTYLE LIBRE SENSOR SYSTEM) MISC AS DIRECTED 6 each 4   glucose blood test strip 1 each by Other route as needed for other. One touch ultra test strips. Use as instructed. 100 each 12   Insulin Pen Needle  (NOVOFINE PLUS) 32G X 4 MM MISC Use one needle daily with insulin 100 each 4   ipratropium (ATROVENT) 0.06 % nasal spray Place 2 sprays into both nostrils 4 (four) times daily. 15 mL 12   losartan (COZAAR) 25 MG tablet TAKE 1/2 TABLET(12.5 MG) BY MOUTH DAILY 45 tablet 1   metFORMIN (GLUCOPHAGE) 1000 MG tablet TAKE 1 TABLET(1000 MG) BY MOUTH TWICE DAILY 180 tablet 1   MOUNJARO 10 MG/0.5ML Pen SMARTSIG:10 Milligram(s) SUB-Q Once a Week     omeprazole (PRILOSEC) 40 MG capsule Take 1 capsule (40 mg total) by mouth daily. 90 capsule 1   sildenafil (VIAGRA) 100 MG tablet TAKE 1/2 TO 1 (ONE-HALF TO ONE) TABLET BY MOUTH ONCE DAILY AS NEEDED FOR ERECTILE DYSFUNCTION 10 tablet 12   simethicone (GAS-X) 80 MG chewable tablet Chew 1 tablet (80 mg total) by mouth every 6 (six) hours as needed for flatulence. 120 tablet 2   tirzepatide (MOUNJARO) 12.5 MG/0.5ML Pen Inject 12.5 mg into the skin once a week. 6 mL 1   No current facility-administered medications for this visit.    Allergies as of 08/14/2022 - Review Complete 08/14/2022  Allergen Reaction Noted   Codeine Nausea Only 11/03/2017   Hydrocodone Nausea Only 05/28/2015   Oxycodone Nausea Only 05/28/2015    Past Medical History:  Diagnosis Date   Diabetes mellitus with renal complications [facility])    Erectile dysfunction  associated with type 2 diabetes mellitus (HCC)    Exotropia    History of tobacco abuse    Hyperlipidemia    Hypertension    Thoracic back pain     Past Surgical History:  Procedure Laterality Date   COLONOSCOPY WITH PROPOFOL N/A 01/25/2019   Procedure: COLONOSCOPY WITH PROPOFOL;  Surgeon: Toney Reil, MD;  Location: [facility] ENDOSCOPY;  Service: Gastroenterology;  Laterality: N/A;  Positive on 12/22/2018   ESOPHAGOGASTRODUODENOSCOPY (EGD) WITH PROPOFOL N/A 07/14/2022   Procedure: ESOPHAGOGASTRODUODENOSCOPY (EGD) WITH PROPOFOL;  Surgeon: Wyline Mood, MD;  Location[facility] ENDOSCOPY;  Service: Gastroenterology;  Laterality: N/A;    HIP SURGERY     SHOULDER SURGERY Left    bone spurs removed    Review of Systems:    All systems reviewed and negative except where noted in HPI.   Physical Examination:   BP 128/82   Pulse 90   Temp 97.7 F (36.5 C)   Ht 5' 7.5" (1.715 m)   Wt 131 lb (59.4 kg)   BMI 20.21 kg/m   General: Very thin male; in no acute distress.  Eyes: No icterus. Conjunctivae pink. Mouth: Oropharyngeal mucosa moist and pink , no lesions erythema or exudate. Lungs: Clear to auscultation bilaterally. Non-labored. Heart: Regular rate and rhythm, no murmurs rubs or gallops.  Abdomen: Bowel sounds are normal; Abdomen is Soft and thin; No hepatosplenomegaly, masses or hernias;  Mild epigastric and peri-umbilical Abdominal Tenderness; No guarding or rebound tenderness. Extremities: No lower extremity edema. No clubbing or deformities. Neuro: Alert and oriented x 3.  Grossly intact. Skin: Warm and dry, no jaundice.   Psych: Alert and cooperative, normal mood and affect.   Imaging Studies: See HPI.  Assessment and Plan:   Arthur [name] is a 56 y.o. y/o male returns for follow-up of GERD and weight loss.  Recent EGD normal.  Negative colonoscopy in 2021.  Possibly adverse side effect of Ozempic or Mounjaro.  Weight is down one 1 or more pound in the past month.  He has lost 70 pounds in the past year. I am scheduling abdominal pelvic CT to rule out malignancy.  If CT is unrevealing, then recommend he follow-up with his PCP to discontinue Mounjaro.  BMI is normal.  Does not need to lose any more weight.  1.  Weight loss 2.  Acid reflux 3.  Abdominal pain, generalized 4.  Nausea  Plan: 1.  Schedule abdominal pelvic CT with contrast 2.  Continue omeprazole 40 Mg once daily 3.  Talk with PCP about stopping Mounjaro 4.  Avoid foods / drinks which increase acid reflux, list given.  Celso Amy, PA-C  Follow up as needed based on CT results and GI symptoms.

## 2022-08-14 NOTE — Telephone Encounter (Signed)
Please find out what they have so we can send what they have and prep Rx and I'll sign

## 2022-08-14 NOTE — Patient Instructions (Addendum)
CT scheduled 08/18/22 @ 3:45 pm. Nothing to eat/drink after 12 noon but water. We will call you with results.

## 2022-08-14 NOTE — Telephone Encounter (Signed)
Called and spoke with pharmacy Freestyle [place] Version 2

## 2022-08-18 ENCOUNTER — Ambulatory Visit
Admission: RE | Admit: 2022-08-18 | Discharge: 2022-08-18 | Disposition: A | Discharge status: Home / Self Care | Payer: 59 | Source: Ambulatory Visit | Attending: Physician Assistant | Admitting: Physician Assistant

## 2022-08-18 DIAGNOSIS — R634 Abnormal weight loss: Secondary | ICD-10-CM | POA: Insufficient documentation

## 2022-08-18 DIAGNOSIS — R1013 Epigastric pain: Secondary | ICD-10-CM | POA: Insufficient documentation

## 2022-08-18 LAB — POCT I-STAT CREATININE: Creatinine, Ser: 1.2 mg/dL (ref 0.61–1.24)

## 2022-08-18 MED ORDER — IOHEXOL 300 MG/ML  SOLN
100.0000 mL | Freq: Once | INTRAMUSCULAR | Status: AC | PRN
  Administered 2022-08-18: 100 mL via INTRAVENOUS

## 2022-08-26 NOTE — Progress Notes (Signed)
Notify pt: 1.) 2 small nodules in the right lower lung.  F/U with PCP to schedule repeat Chest CT in 3-6 Months. 2.) 2.5cm lesion in the right liver which is most likely a benign hemangioma. Velna Hatchet, Please schedule MRI Liver protocol to further evaluate Liver Lesion. 3.) No other abnormality to explain weight loss.  F/U with PCP to discuss holding Mounjaro. Celso Amy, PA-C

## 2022-08-27 ENCOUNTER — Encounter: Payer: Self-pay | Admitting: Family Medicine

## 2022-08-27 ENCOUNTER — Telehealth: Payer: Self-pay

## 2022-08-27 DIAGNOSIS — K769 Liver disease, unspecified: Secondary | ICD-10-CM

## 2022-08-27 LAB — HM DIABETES EYE EXAM

## 2022-08-27 NOTE — Telephone Encounter (Signed)
MRI Liver scheduled 7:45 arrival 08/31/22 @ [facility] entrance.Discussed all other results. Patient 's questions were abswered and I let him know we would call with results of MRI next week.

## 2022-08-30 ENCOUNTER — Encounter: Payer: Self-pay | Admitting: Family Medicine

## 2022-08-30 DIAGNOSIS — R911 Solitary pulmonary nodule: Secondary | ICD-10-CM | POA: Insufficient documentation

## 2022-08-31 ENCOUNTER — Ambulatory Visit
Admission: RE | Admit: 2022-08-31 | Discharge: 2022-08-31 | Disposition: A | Discharge status: Home / Self Care | Payer: 59 | Source: Ambulatory Visit | Attending: Physician Assistant | Admitting: Physician Assistant

## 2022-08-31 DIAGNOSIS — K769 Liver disease, unspecified: Secondary | ICD-10-CM | POA: Insufficient documentation

## 2022-08-31 MED ORDER — GADOBUTROL 1 MMOL/ML IV SOLN
6.0000 mL | Freq: Once | INTRAVENOUS | Status: AC | PRN
  Administered 2022-08-31: 6 mL via INTRAVENOUS

## 2022-09-02 ENCOUNTER — Encounter: Payer: Self-pay | Admitting: Family Medicine

## 2022-09-08 NOTE — Progress Notes (Signed)
Abdominal MRI shows 2.1 cm lobular lesion just above the gallbladder fossa.  Concerning for possible atypical malignancy.  Could also be benign.  PET CT scan or biopsy is recommended.  I discussed results with Dr. Tobi Bastos in our office.  He recommended refer to oncology for further evaluation and decide next steps.  I will message Dr. Smith Robert and she we will try to see patient this week.  I called and discussed results with patient.  He is aware oncology will be calling him to schedule an appointment. Celso Amy, PA-C

## 2022-09-09 ENCOUNTER — Other Ambulatory Visit: Payer: Self-pay

## 2022-09-09 DIAGNOSIS — K769 Liver disease, unspecified: Secondary | ICD-10-CM

## 2022-09-10 NOTE — Progress Notes (Signed)
Arthur Mor, DO sent to [name] for US guided biopsy of right liver mass. MR 08/31/22 35/76 of series 18.  If it is hyperechoic on [place], might consider observing.    [address]

## 2022-09-11 ENCOUNTER — Inpatient Hospital Stay: Payer: 59

## 2022-09-11 ENCOUNTER — Encounter: Payer: Self-pay | Admitting: Oncology

## 2022-09-11 ENCOUNTER — Inpatient Hospital Stay: Payer: 59 | Attending: Oncology | Admitting: Oncology

## 2022-09-11 VITALS — BP 133/86 | HR 106 | Temp 95.1°F | Ht 67.5 in | Wt 129.0 lb

## 2022-09-11 DIAGNOSIS — K769 Liver disease, unspecified: Secondary | ICD-10-CM

## 2022-09-11 DIAGNOSIS — N289 Disorder of kidney and ureter, unspecified: Secondary | ICD-10-CM | POA: Diagnosis not present

## 2022-09-11 DIAGNOSIS — Z87891 Personal history of nicotine dependence: Secondary | ICD-10-CM | POA: Insufficient documentation

## 2022-09-11 DIAGNOSIS — R911 Solitary pulmonary nodule: Secondary | ICD-10-CM | POA: Diagnosis not present

## 2022-09-11 DIAGNOSIS — Z794 Long term (current) use of insulin: Secondary | ICD-10-CM | POA: Diagnosis not present

## 2022-09-11 LAB — CBC WITH DIFFERENTIAL/PLATELET
Abs Immature Granulocytes: 0.01 10*3/uL (ref 0.00–0.07)
Basophils Absolute: 0 10*3/uL (ref 0.0–0.1)
Basophils Relative: 0 %
Eosinophils Absolute: 0.1 10*3/uL (ref 0.0–0.5)
Eosinophils Relative: 2 %
HCT: 42.1 % (ref 39.0–52.0)
Hemoglobin: 14.2 g/dL (ref 13.0–17.0)
Immature Granulocytes: 0 %
Lymphocytes Relative: 36 %
Lymphs Abs: 1.7 10*3/uL (ref 0.7–4.0)
MCH: 27.7 pg (ref 26.0–34.0)
MCHC: 33.7 g/dL (ref 30.0–36.0)
MCV: 82.1 fL (ref 80.0–100.0)
Monocytes Absolute: 0.4 10*3/uL (ref 0.1–1.0)
Monocytes Relative: 8 %
Neutro Abs: 2.5 10*3/uL (ref 1.7–7.7)
Neutrophils Relative %: 54 %
Platelets: 298 10*3/uL (ref 150–400)
RBC: 5.13 MIL/uL (ref 4.22–5.81)
RDW: 12.7 % (ref 11.5–15.5)
WBC: 4.7 10*3/uL (ref 4.0–10.5)
nRBC: 0 % (ref 0.0–0.2)

## 2022-09-11 LAB — COMPREHENSIVE METABOLIC PANEL
ALT: 20 U/L (ref 0–44)
AST: 23 U/L (ref 15–41)
Albumin: 4.9 g/dL (ref 3.5–5.0)
Alkaline Phosphatase: 77 U/L (ref 38–126)
Anion gap: 9 (ref 5–15)
BUN: 18 mg/dL (ref 6–20)
CO2: 25 mmol/L (ref 22–32)
Calcium: 10 mg/dL (ref 8.9–10.3)
Chloride: 102 mmol/L (ref 98–111)
Creatinine, Ser: 1.18 mg/dL (ref 0.61–1.24)
GFR, Estimated: >60 mL/min (ref >60)
Glucose, Bld: 89 mg/dL (ref 70–99)
Potassium: 4.2 mmol/L (ref 3.5–5.1)
Sodium: 136 mmol/L (ref 135–145)
Total Bilirubin: 0.7 mg/dL (ref 0.3–1.2)
Total Protein: 7.9 g/dL (ref 6.5–8.1)

## 2022-09-11 NOTE — Progress Notes (Unsigned)
Met with Arthur [name]. Reviewed instructions for liver biopsy and provided printed copy in AVS. All questions answered.

## 2022-09-11 NOTE — Patient Instructions (Addendum)
Liver biopsy scheduled for September 18th, 2024  Arrive at 0900  for 1000 appointment Come into the Heart and Vascular entrance at [facility]. This entrance is located in the front of the hospital. Do not eat or drink anything after midnight You will need a driver for this procedure  Take your 81mg  aspirin on September 12th, 2024 then hold it for your procedure

## 2022-09-12 LAB — CEA: CEA: 3 ng/mL (ref 0.0–4.7)

## 2022-09-12 LAB — CANCER ANTIGEN 19-9: CA 19-9: <2 U/mL (ref 0–35)

## 2022-09-12 LAB — AFP TUMOR MARKER: AFP, Serum, Tumor Marker: 2.6 ng/mL (ref 0.0–8.4)

## 2022-09-13 NOTE — Progress Notes (Signed)
Hematology/Oncology Consult note [facility] [phone] [phone]  Patient Care Team: Dorcas Carrow, DO as PCP - General (Family Medicine) Debbe Odea, MD as PCP - Cardiology (Cardiology) Tedd Sias Marlana Salvage, MD as Consulting Physician (Endocrinology) Lajean Manes, [facility] (Inactive) (Pharmacist) Benita Gutter, RN as Oncology Nurse Navigator   Name of the patient: Arthur [name]  [id number]  [birth year]    Reason for referral- liver lesion   Referring physician-Dr. Laural Benes  Date of visit: 09/13/22   History of presenting illness- Patient is a 57 year old male with a past medical history significant for hypertension hyperlipidemia and type 2 diabetes who underwent a CT abdomen for symptoms of abdominal pain which showed a 2 mm right lung nodule and an indeterminate enhancing nodule in the liver.  This was followed by MRI liver with and without contrast which showed a T2 bright lesion measuring 2.1 x 1.6 cm which was not present on prior imaging in 2021.  Etiology unclear.  He was also noted to have a filling defect in the upper pole of the calyx on the left side and a complex right sided small renal lesion possibly a hemorrhagic cyst.  Patient has been referred for these findings  Patient feels anxious about the results of his MRI.  Overall he is doing well and appetite is good and he has lost weight due to [facility].  ECOG PS- 0  Pain scale- 0   Review of systems- Review of Systems  Constitutional:  Negative for chills, fever, malaise/fatigue and weight loss.  HENT:  Negative for congestion, ear discharge and nosebleeds.   Eyes:  Negative for blurred vision.  Respiratory:  Negative for cough, hemoptysis, sputum production, shortness of breath and wheezing.   Cardiovascular:  Negative for chest pain, palpitations, orthopnea and claudication.  Gastrointestinal:  Negative for abdominal pain, blood in stool, constipation, diarrhea,  heartburn, melena, nausea and vomiting.  Genitourinary:  Negative for dysuria, flank pain, frequency, hematuria and urgency.  Musculoskeletal:  Negative for back pain, joint pain and myalgias.  Skin:  Negative for rash.  Neurological:  Negative for dizziness, tingling, focal weakness, seizures, weakness and headaches.  Endo/Heme/Allergies:  Does not bruise/bleed easily.  Psychiatric/Behavioral:  Negative for depression and suicidal ideas. The patient does not have insomnia.     Allergies  Allergen Reactions   Codeine Nausea Only   Hydrocodone Nausea Only   Oxycodone Nausea Only    Patient Active Problem List   Diagnosis Date Noted   Lung nodule 08/30/2022   Loss of weight 07/14/2022   Gastroesophageal reflux disease 03/11/2021   Onychomycosis 12/13/2019   Tachyarrhythmia 01/30/2019   Hypokalemia    Type 2 diabetes mellitus (HCC) 05/28/2015   Chronic pain of left knee 12/07/2014   Benign prostatic hyperplasia 09/05/2014   Benign hypertensive renal disease    Type 2 diabetes mellitus with renal manifestations (HCC) 06/12/2014   Dyslipidemia 06/12/2014   Vasculogenic erectile dysfunction 06/12/2014   Exotropia, monocular 06/12/2014   Erectile dysfunction associated with type 2 diabetes mellitus [facility])    ED (erectile dysfunction) of organic origin 05/09/2012     Past Medical History:  Diagnosis Date   Diabetes mellitus with renal complications (HCC)    Erectile dysfunction associated with type 2 diabetes mellitus (HCC)    Exotropia    History of tobacco abuse    Hyperlipidemia    Hypertension    Thoracic back pain      Past Surgical History:  Procedure Laterality Date  COLONOSCOPY WITH PROPOFOL N/A 01/25/2019   Procedure: COLONOSCOPY WITH PROPOFOL;  Surgeon: Toney Reil, MD;  Location: [facility] ENDOSCOPY;  Service: Gastroenterology;  Laterality: N/A;  Positive on 12/22/2018   ESOPHAGOGASTRODUODENOSCOPY (EGD) WITH PROPOFOL N/A 07/14/2022   Procedure:  ESOPHAGOGASTRODUODENOSCOPY (EGD) WITH PROPOFOL;  Surgeon: Wyline Mood, MD;  Location: [facility] ENDOSCOPY;  Service: Gastroenterology;  Laterality: N/A;   HIP SURGERY     SHOULDER SURGERY Left    bone spurs removed    Social History   Socioeconomic History   Marital status: Married    Spouse name: Not on file   Number of children: Not on file   Years of education: Not on file   Highest education level: Not on file  Occupational History   Not on file  Tobacco Use   Smoking status: Former    Current packs/day: 0.00    Average packs/day: 1 pack/day for 20.0 years (20.0 ttl pk-yrs)    Types: Cigarettes    Start date: 01/05/1966    Quit date: 01/05/1986    Years since quitting: 36.7   Smokeless tobacco: Never  Vaping Use   Vaping status: Never Used  Substance and Sexual Activity   Alcohol use: No   Drug use: No   Sexual activity: Yes  Other Topics Concern   Not on file  Social History Narrative   Not on file   Social Determinants of Health   Financial Resource Strain: Not on file  Food Insecurity: Not on file  Transportation Needs: Not on file  Physical Activity: Not on file  Stress: Not on file  Social Connections: Unknown (05/20/2021)   Received from [facility]   Social Network    Social Network: Not on file  Intimate Partner Violence: Unknown (04/11/2021)   Received from [facility]   HITS    Physically Hurt: Not on file    Insult or Talk Down To: Not on file    Threaten Physical Harm: Not on file    Scream or Curse: Not on file     Family History  Problem Relation Age of Onset   Diabetes Father    Lung disease Mother    Diabetes Sister    Diabetes Maternal Aunt      Current Outpatient Medications:    ASPIRIN LOW DOSE 81 MG tablet, TAKE 1 TABLET(81 MG) BY MOUTH DAILY, Disp: 90 tablet, Rfl: 0   atorvastatin (LIPITOR) 20 MG tablet, Take 1 tablet (20 mg total) by mouth daily., Disp: 90 tablet, Rfl: 1   Continuous Glucose Sensor (FREESTYLE LIBRE 2 SENSOR) MISC,  Use as directed, Disp: 30 each, Rfl: 12   Continuous Glucose Sensor (FREESTYLE LIBRE SENSOR SYSTEM) MISC, AS DIRECTED, Disp: 6 each, Rfl: 4   glucose blood test strip, 1 each by Other route as needed for other. One touch ultra test strips. Use as instructed., Disp: 100 each, Rfl: 12   Insulin Pen Needle (NOVOFINE PLUS) 32G X 4 MM MISC, Use one needle daily with insulin, Disp: 100 each, Rfl: 4   ipratropium (ATROVENT) 0.06 % nasal spray, Place 2 sprays into both nostrils 4 (four) times daily., Disp: 15 mL, Rfl: 12   losartan (COZAAR) 25 MG tablet, TAKE 1/2 TABLET(12.5 MG) BY MOUTH DAILY, Disp: 45 tablet, Rfl: 1   metFORMIN (GLUCOPHAGE) 1000 MG tablet, TAKE 1 TABLET(1000 MG) BY MOUTH TWICE DAILY, Disp: 180 tablet, Rfl: 1   MOUNJARO 10 MG/0.5ML Pen, SMARTSIG:10 Milligram(s) SUB-Q Once a Week, Disp: , Rfl:    omeprazole (PRILOSEC)  40 MG capsule, Take 1 capsule (40 mg total) by mouth daily., Disp: 90 capsule, Rfl: 1   sildenafil (VIAGRA) 100 MG tablet, TAKE 1/2 TO 1 (ONE-HALF TO ONE) TABLET BY MOUTH ONCE DAILY AS NEEDED FOR ERECTILE DYSFUNCTION, Disp: 10 tablet, Rfl: 12   simethicone (GAS-X) 80 MG chewable tablet, Chew 1 tablet (80 mg total) by mouth every 6 (six) hours as needed for flatulence., Disp: 120 tablet, Rfl: 2   tirzepatide (MOUNJARO) 12.5 MG/0.5ML Pen, Inject 12.5 mg into the skin once a week., Disp: 6 mL, Rfl: 1   Physical exam:  Vitals:   09/11/22 1529  BP: 133/86  Pulse: (!) 106  Temp: (!) 95.1 F (35.1 C)  TempSrc: Tympanic  SpO2: 99%  Weight: 129 lb (58.5 kg)  Height: 5' 7.5" (1.715 m)   Physical Exam Cardiovascular:     Rate and Rhythm: Normal rate and regular rhythm.     Heart sounds: Normal heart sounds.  Pulmonary:     Effort: Pulmonary effort is normal.     Breath sounds: Normal breath sounds.  Abdominal:     General: Bowel sounds are normal.     Palpations: Abdomen is soft.  Skin:    General: Skin is warm and dry.  Neurological:     Mental Status: He is alert  and oriented to person, place, and time.           Latest Ref Rng & Units 09/11/2022    4:24 PM  CMP  Glucose 70 - 99 mg/dL 89   BUN 6 - 20 mg/dL 18   Creatinine 7.82 - 1.24 mg/dL 9.56   Sodium 213 - 086 mmol/L 136   Potassium 3.5 - 5.1 mmol/L 4.2   Chloride 98 - 111 mmol/L 102   CO2 22 - 32 mmol/L 25   Calcium 8.9 - 10.3 mg/dL 57.8   Total Protein 6.5 - 8.1 g/dL 7.9   Total Bilirubin 0.3 - 1.2 mg/dL 0.7   Alkaline Phos 38 - 126 U/L 77   AST 15 - 41 U/L 23   ALT 0 - 44 U/L 20       Latest Ref Rng & Units 09/11/2022    4:24 PM  CBC  WBC 4.0 - 10.5 K/uL 4.7   Hemoglobin 13.0 - 17.0 g/dL 46.9   Hematocrit 62.9 - 52.0 % 42.1   Platelets 150 - 400 K/uL 298     No images are attached to the encounter.  MR LIVER W WO CONTRAST  Result Date: 09/04/2022 CLINICAL DATA:  Liver lesion. EXAM: MRI ABDOMEN WITHOUT AND WITH CONTRAST TECHNIQUE: Multiplanar multisequence MR imaging of the abdomen was performed both before and after the administration of intravenous contrast. CONTRAST:  6mL GADAVIST GADOBUTROL 1 MMOL/ML IV SOLN COMPARISON:  CT 08/18/2022 FINDINGS: Lower chest: Lung bases are clear. Hepatobiliary: No intrahepatic biliary ductal dilatation. Gallbladder is nondilated. Patent portal vein corresponding to the finding by CT is a focal lesion seen in segment 4 just above the gallbladder fossa. Lesion is mildly bright on T2, slightly dark on T1 precontrast. Lesion is homogeneous in enhancement in the arterial phase and overall measures 2.1 by 1.6 cm on series 14, image 36. The margins on that series are slightly lobular. Lesion maintains enhancement above adjacent liver parenchyma on the dynamic. Lesion is visible on diffusion Pancreas: Preserved pancreatic parenchyma. No pancreatic ductal dilatation. No abnormal pancreatic T1 signal. Spleen:  Within normal limits in size and appearance. Adrenals/Urinary Tract: Adrenal glands are preserved. Kidneys have  some small, punctate bright T2 foci  consistent with benign cystic foci. There is 1 lesion which is somewhat dark on T2 posterior in the upper pole of the right kidney measuring 9 mm. This has isointense on precontrast T1 and is not clearly enhancing. Possible proteinaceous or hemorrhagic Bosniak 2 lesion. On prior CT there is a subtle filling defect along the anterior upper pole left-sided renal calyx of uncertain etiology and significance. There is also a cystic like appearance to the calices bilaterally. Please correlate with any symptoms or follow up evaluation Stomach/Bowel: Moderate colonic stool visualized small and large bowel are nondilated. There is some areas of small bowel loops with collapse or underdistention in the left hemiabdomen Vascular/Lymphatic: Mild atherosclerotic changes along the aorta. Normal caliber aorta and IVC. No restricted diffusion Other:  No ascites. Musculoskeletal: Mild degenerative changes along the spine. IMPRESSION: 2.1 cm lobular enhancing lesion in segment 4 just above the gallbladder fossa in the area of abnormality by prior imaging. Based on the overall appearance, as well as the fact that this has not seen in 2021, this could be an atypical malignancy. There is a differential including benign etiologies. Overall recommend further workup such as PET-CT scan as the next step. Percutaneous sampling would also be an option Question tubular ectasia of the kidneys. Question of a subtle filling defect along the upper pole calyx on the left. Please correlate with any clinical symptoms such as hematuria. There is also a complex upper pole right-sided small renal lesion, possibly proteinaceous or hemorrhagic cysts. Otherwise recommend follow up CT with and without contrast in 6 months with delayed imaging. Findings will be called to the ordering service by the Radiology physician assistant team. Electronically Signed   By: Karen Kays M.D.   On: 09/04/2022 15:23   CT ABDOMEN PELVIS W CONTRAST  Result Date:  08/26/2022 CLINICAL DATA:  Weight loss, abdominal pain. EXAM: CT ABDOMEN AND PELVIS WITH CONTRAST TECHNIQUE: Multidetector CT imaging of the abdomen and pelvis was performed using the standard protocol following bolus administration of intravenous contrast. RADIATION DOSE REDUCTION: This exam was performed according to the departmental dose-optimization program which includes automated exposure control, adjustment of the mA and/or kV according to patient size and/or use of iterative reconstruction technique. CONTRAST:  OMNIPAQUE IOHEXOL 300 MG/ML  SOLN COMPARISON:  05/05/2008 FINDINGS: Lower chest: 9 mm nodular lesion likely represents subsegmental atelectasis at the right base. Tiny mm sized nodule in the right lower lobe as well. Follow up 3-6 month CT recommended to ensure resolution as a precaution. Hepatobiliary: Enhancing lesion in the right lobe of the liver measures 2.5 cm. This is most likely an atypical hemangioma. Liver protocol CT or MRI can be done for further evaluation. No biliary ductal dilatation. Gallbladder is unremarkable. Pancreas: Unremarkable. No pancreatic ductal dilatation or surrounding inflammatory changes. Spleen: Normal in size without focal abnormality. Adrenals/Urinary Tract: No adrenal lesions. 1 cm cyst right kidney. No imaging follow up for the cysts. No stones or hydronephrosis. Unremarkable urinary bladder. Stomach/Bowel: Stomach is within normal limits. Appendix appears normal. No evidence of bowel wall thickening, distention, or inflammatory changes. Diverticulosis noted of the descending and sigmoid colon. Vascular/Lymphatic: Aortic atherosclerosis. No enlarged abdominal or pelvic lymph nodes. Reproductive: Prostate is unremarkable. Other: No abdominal wall hernia or abnormality. No abdominopelvic ascites. Musculoskeletal: No acute or significant osseous findings. IMPRESSION: 1. 2 mm nodule right lung base. Additional nodular lesion at the right base likely represents  subsegmental atelectasis or scarring. 3-6 month CT follow-up  recommended as a precaution. 2. Indeterminate enhancing nodule in the liver. Consider liver protocol CT or MRI for further evaluation. 3. Tiny right kidney cyst. Electronically Signed   By: Layla Maw M.D.   On: 08/26/2022 16:22    Assessment and plan- Patient is a 57 y.o. male referred for segment 2 liver lesion  Liver lesion: Etiology unclear.  He does not have any baseline cirrhosis.  I am arranging for CT-guided biopsy of this lesion.  I am also checking tumor markers AFP CEA and CA 19-9 today.  Further imaging to be based on results of biopsy.  I am holding off on getting a PET scan at this time.  I will see him back after the results of the biopsy are back.  CT scan showed filling defect in the upper pole of the left kidney calyx.  There was also concern for a small complex cyst involving the right kidney probably a hemorrhagic cyst.  I am referring him to urology for these findings.  He was noted to have a 2 mm nodule in his right lung base which will need to be followed up by Dr. Laural Benes in 3 to 6 months time   Thank you for this kind referral and the opportunity to participate in the care of this  Patient   Visit Diagnosis 1. Liver lesion     Dr. Owens Shark, MD, MPH [facility] [phone] 09/13/2022

## 2022-09-22 ENCOUNTER — Other Ambulatory Visit: Payer: Self-pay | Admitting: Radiology

## 2022-09-22 DIAGNOSIS — K769 Liver disease, unspecified: Secondary | ICD-10-CM

## 2022-09-22 NOTE — Progress Notes (Signed)
Patient for US guided RT Liver Mass Biopsy on Wed 09/23/2022, I called and spoke with the patient on the phone and gave pre-procedure instructions. Pt was made aware to be here at 9a, last dose of ASA 81mg  was Thurs 09/17/2022, NPO after MN prior to procedure as well as driver post procedure/recovery/discharge. Pt stated understanding.  Called 09/22/2022

## 2022-09-23 ENCOUNTER — Ambulatory Visit
Admission: RE | Admit: 2022-09-23 | Discharge: 2022-09-23 | Disposition: A | Discharge status: Home / Self Care | Payer: 59 | Source: Ambulatory Visit | Attending: Oncology | Admitting: Oncology

## 2022-09-23 ENCOUNTER — Other Ambulatory Visit: Payer: Self-pay | Admitting: Interventional Radiology

## 2022-09-23 DIAGNOSIS — K769 Liver disease, unspecified: Secondary | ICD-10-CM

## 2022-09-23 DIAGNOSIS — R911 Solitary pulmonary nodule: Secondary | ICD-10-CM | POA: Diagnosis not present

## 2022-09-23 DIAGNOSIS — E119 Type 2 diabetes mellitus without complications: Secondary | ICD-10-CM | POA: Insufficient documentation

## 2022-09-23 DIAGNOSIS — I10 Essential (primary) hypertension: Secondary | ICD-10-CM | POA: Diagnosis not present

## 2022-09-23 LAB — GLUCOSE, CAPILLARY: Glucose-Capillary: 150 mg/dL — ABNORMAL HIGH (ref 70–99)

## 2022-09-23 LAB — CBC
HCT: 44.1 % (ref 39.0–52.0)
Hemoglobin: 14.8 g/dL (ref 13.0–17.0)
MCH: 27.7 pg (ref 26.0–34.0)
MCHC: 33.6 g/dL (ref 30.0–36.0)
MCV: 82.6 fL (ref 80.0–100.0)
Platelets: 313 10*3/uL (ref 150–400)
RBC: 5.34 MIL/uL (ref 4.22–5.81)
RDW: 13.2 % (ref 11.5–15.5)
WBC: 4.9 10*3/uL (ref 4.0–10.5)
nRBC: 0 % (ref 0.0–0.2)

## 2022-09-23 LAB — PROTIME-INR
INR: 1 (ref 0.8–1.2)
Prothrombin Time: 13.5 s (ref 11.4–15.2)

## 2022-09-23 MED ORDER — MIDAZOLAM HCL 2 MG/2ML IJ SOLN
INTRAMUSCULAR | Status: AC
  Filled 2022-09-23: qty 2

## 2022-09-23 MED ORDER — SODIUM CHLORIDE 0.9 % IV SOLN: INTRAVENOUS | Status: DC

## 2022-09-23 MED ORDER — SULFUR HEXAFLUORIDE MICROSPH 60.7-25 MG IJ SUSR
5.0000 mL | Freq: Once | INTRAMUSCULAR | Status: AC | PRN
  Administered 2022-09-23: 5 mL via INTRAVENOUS

## 2022-09-23 MED ORDER — FENTANYL CITRATE (PF) 100 MCG/2ML IJ SOLN
INTRAMUSCULAR | Status: AC
  Filled 2022-09-23: qty 2

## 2022-09-23 MED ORDER — LIDOCAINE HCL 1 % IJ SOLN
10.0000 mL | Freq: Once | INTRAMUSCULAR | Status: AC
  Administered 2022-09-23: 10 mL via INTRADERMAL
  Filled 2022-09-23: qty 10

## 2022-09-23 MED ORDER — MIDAZOLAM HCL 5 MG/5ML IJ SOLN
INTRAMUSCULAR | Status: DC | PRN
Start: 2022-09-23 — End: 2022-09-24
  Administered 2022-09-23: .5 mg via INTRAVENOUS
  Administered 2022-09-23: 1 mg via INTRAVENOUS

## 2022-09-23 MED ORDER — FENTANYL CITRATE (PF) 100 MCG/2ML IJ SOLN
INTRAMUSCULAR | Status: DC | PRN
Start: 2022-09-23 — End: 2022-09-24
  Administered 2022-09-23: 50 ug via INTRAVENOUS
  Administered 2022-09-23: 25 ug via INTRAVENOUS

## 2022-09-23 NOTE — Progress Notes (Signed)
Patient clinically stable post US Liver biopsy with contrast, tolerated well. Vitals stable pre and post procedure. Denies complaints post procedure. Received Versed 1.5 mg along with Fentanyl 75 mcg IV for procedure. Report given to Alger Simons RN post procedure/17/specials.

## 2022-09-23 NOTE — Procedures (Signed)
Interventional Radiology Procedure Note  Procedure: US guided right liver lesion biopsy  Indication: Right liver mass  Findings: Please refer to procedural dictation for full description.  Complications: None  EBL: < 10 mL  Acquanetta Belling, MD [phone]

## 2022-09-23 NOTE — H&P (Signed)
Chief Complaint: Patient was seen in consultation today for liver lesion biopsy   Referring Physician(s): Rao,Archana C  Supervising Physician: Mir, Mauri Reading  Patient Status: [facility] - Out-pt  History of Present Illness: Arthur [name] is a 57 y.o. male with a medical history significant for HTN and DM. He recently underwent a CT abdomen for abdominal pain and this showed a 2 mm right lung nodule and an indeterminate enhancing nodule in the liver. This was followed by an MR liver which showed a 2.1 x 1.6 cm lesion.   MR Liver 08/31/22 IMPRESSION: 1. 2.1 cm lobular enhancing lesion in segment 4 just above the gallbladder fossa in the area of abnormality by prior imaging. Based on the overall appearance, as well as the fact that this has not seen in 2021, this could be an atypical malignancy. There is a differential including benign etiologies. Overall recommend further workup such as PET-CT scan as the next step. Percutaneous sampling would also be an option 2. Question tubular ectasia of the kidneys. Question of a subtle filling defect along the upper pole calyx on the left. Please correlate with any clinical symptoms such as hematuria. There is also a complex upper pole right-sided small renal lesion, possibly proteinaceous or hemorrhagic cysts. Otherwise recommend follow up CT with and without contrast in 6 months with delayed imaging.  Interventional Radiology has been asked to evaluate this patient for an image-guided liver lesion biopsy for further work up. Imaging reviewed and procedure approved by Dr. Loreta Ave.   Past Medical History:  Diagnosis Date   Diabetes mellitus with renal complications (HCC)    Erectile dysfunction associated with type 2 diabetes mellitus (HCC)    Exotropia    History of tobacco abuse    Hyperlipidemia    Hypertension    Thoracic back pain     Past Surgical History:  Procedure Laterality Date   COLONOSCOPY WITH PROPOFOL N/A 01/25/2019    Procedure: COLONOSCOPY WITH PROPOFOL;  Surgeon: Toney Reil, MD;  Location: [facility] ENDOSCOPY;  Service: Gastroenterology;  Laterality: N/A;  Positive on 12/22/2018   ESOPHAGOGASTRODUODENOSCOPY (EGD) WITH PROPOFOL N/A 07/14/2022   Procedure: ESOPHAGOGASTRODUODENOSCOPY (EGD) WITH PROPOFOL;  Surgeon: Wyline Mood, MD;  Location: [facility] ENDOSCOPY;  Service: Gastroenterology;  Laterality: N/A;   HIP SURGERY     SHOULDER SURGERY Left    bone spurs removed    Allergies: Codeine, Hydrocodone, and Oxycodone  Medications: Prior to Admission medications   Medication Sig Start Date End Date Taking? Authorizing Provider  ASPIRIN LOW DOSE 81 MG tablet TAKE 1 TABLET(81 MG) BY MOUTH DAILY 02/02/22   Johnson, Megan P, DO  atorvastatin (LIPITOR) 20 MG tablet Take 1 tablet (20 mg total) by mouth daily. 05/12/22   Dorcas Carrow, DO  Continuous Glucose Sensor (FREESTYLE LIBRE 2 SENSOR) MISC Use as directed 08/14/22   Olevia Perches P, DO  Continuous Glucose Sensor (FREESTYLE LIBRE SENSOR SYSTEM) MISC AS DIRECTED 05/12/22   Olevia Perches P, DO  glucose blood test strip 1 each by Other route as needed for other. One touch ultra test strips. Use as instructed. 11/25/21   Johnson, Megan P, DO  Insulin Pen Needle (NOVOFINE PLUS) 32G X 4 MM MISC Use one needle daily with insulin 06/24/18   Johnson, Megan P, DO  ipratropium (ATROVENT) 0.06 % nasal spray Place 2 sprays into both nostrils 4 (four) times daily. 03/11/21   Johnson, Megan P, DO  losartan (COZAAR) 25 MG tablet TAKE 1/2 TABLET(12.5 MG) BY MOUTH DAILY 05/12/22  Johnson, Megan P, DO  metFORMIN (GLUCOPHAGE) 1000 MG tablet TAKE 1 TABLET(1000 MG) BY MOUTH TWICE DAILY 05/12/22   Johnson, Megan P, DO  MOUNJARO 10 MG/0.5ML Pen SMARTSIG:10 Milligram(s) SUB-Q Once a Week 08/04/22   [provider]  omeprazole (PRILOSEC) 40 MG capsule Take 1 capsule (40 mg total) by mouth daily. 05/12/22   Johnson, Megan P, DO  sildenafil (VIAGRA) 100 MG tablet TAKE 1/2 TO 1 (ONE-HALF TO  ONE) TABLET BY MOUTH ONCE DAILY AS NEEDED FOR ERECTILE DYSFUNCTION 11/25/21   Olevia Perches P, DO  simethicone (GAS-X) 80 MG chewable tablet Chew 1 tablet (80 mg total) by mouth every 6 (six) hours as needed for flatulence. 02/17/22   Johnson, Megan P, DO  tirzepatide [facility]) 12.5 MG/0.5ML Pen Inject 12.5 mg into the skin once a week. 08/13/22   Dorcas Carrow, DO     Family History  Problem Relation Age of Onset   Diabetes Father    Lung disease Mother    Diabetes Sister    Diabetes Maternal Aunt     Social History   Socioeconomic History   Marital status: Married    Spouse name: Not on file   Number of children: Not on file   Years of education: Not on file   Highest education level: Not on file  Occupational History   Not on file  Tobacco Use   Smoking status: Former    Current packs/day: 0.00    Average packs/day: 1 pack/day for 20.0 years (20.0 ttl pk-yrs)    Types: Cigarettes    Start date: 01/05/1966    Quit date: 01/05/1986    Years since quitting: 36.7   Smokeless tobacco: Never  Vaping Use   Vaping status: Never Used  Substance and Sexual Activity   Alcohol use: No   Drug use: No   Sexual activity: Yes  Other Topics Concern   Not on file  Social History Narrative   Not on file   Social Determinants of Health   Financial Resource Strain: Not on file  Food Insecurity: Not on file  Transportation Needs: Not on file  Physical Activity: Not on file  Stress: Not on file  Social Connections: Unknown (05/20/2021)   Received from [facility]   Social Network    Social Network: Not on file    Review of Systems: A 12 point ROS discussed and pertinent positives are indicated in the HPI above.  All other systems are negative.  Review of Systems  Constitutional:  Negative for appetite change and fatigue.  Respiratory:  Negative for cough and shortness of breath.   Cardiovascular:  Negative for chest pain and leg swelling.  Gastrointestinal:   Negative for abdominal pain, diarrhea, nausea and vomiting.  Neurological:  Negative for dizziness and headaches.    Vital Signs: BP (!) 165/91   Pulse 82   Temp 97.7 F (36.5 C)   Resp 15   Ht 5\' 8"  (1.727 m)   Wt 129 lb (58.5 kg)   SpO2 99%   BMI 19.61 kg/m   Physical Exam Constitutional:      General: He is not in acute distress.    Appearance: He is not ill-appearing.  HENT:     Mouth/Throat:     Mouth: Mucous membranes are moist.     Pharynx: Oropharynx is clear.  Cardiovascular:     Rate and Rhythm: Normal rate and regular rhythm.     Pulses: Normal pulses.  Heart sounds: Normal heart sounds.  Pulmonary:     Effort: Pulmonary effort is normal.     Breath sounds: Normal breath sounds.  Abdominal:     General: Bowel sounds are normal.     Palpations: Abdomen is soft.     Tenderness: There is no abdominal tenderness.  Musculoskeletal:     Right lower leg: No edema.     Left lower leg: No edema.  Skin:    General: Skin is warm and dry.  Neurological:     Mental Status: He is alert and oriented to person, place, and time.  Psychiatric:        Mood and Affect: Mood normal.        Behavior: Behavior normal.        Thought Content: Thought content normal.     Imaging: MR LIVER W WO CONTRAST  Result Date: 09/04/2022 CLINICAL DATA:  Liver lesion. EXAM: MRI ABDOMEN WITHOUT AND WITH CONTRAST TECHNIQUE: Multiplanar multisequence MR imaging of the abdomen was performed both before and after the administration of intravenous contrast. CONTRAST:  6mL GADAVIST GADOBUTROL 1 MMOL/ML IV SOLN COMPARISON:  CT 08/18/2022 FINDINGS: Lower chest: Lung bases are clear. Hepatobiliary: No intrahepatic biliary ductal dilatation. Gallbladder is nondilated. Patent portal vein corresponding to the finding by CT is a focal lesion seen in segment 4 just above the gallbladder fossa. Lesion is mildly bright on T2, slightly dark on T1 precontrast. Lesion is homogeneous in enhancement in the  arterial phase and overall measures 2.1 by 1.6 cm on series 14, image 36. The margins on that series are slightly lobular. Lesion maintains enhancement above adjacent liver parenchyma on the dynamic. Lesion is visible on diffusion Pancreas: Preserved pancreatic parenchyma. No pancreatic ductal dilatation. No abnormal pancreatic T1 signal. Spleen:  Within normal limits in size and appearance. Adrenals/Urinary Tract: Adrenal glands are preserved. Kidneys have some small, punctate bright T2 foci consistent with benign cystic foci. There is 1 lesion which is somewhat dark on T2 posterior in the upper pole of the right kidney measuring 9 mm. This has isointense on precontrast T1 and is not clearly enhancing. Possible proteinaceous or hemorrhagic Bosniak 2 lesion. On prior CT there is a subtle filling defect along the anterior upper pole left-sided renal calyx of uncertain etiology and significance. There is also a cystic like appearance to the calices bilaterally. Please correlate with any symptoms or follow up evaluation Stomach/Bowel: Moderate colonic stool visualized small and large bowel are nondilated. There is some areas of small bowel loops with collapse or underdistention in the left hemiabdomen Vascular/Lymphatic: Mild atherosclerotic changes along the aorta. Normal caliber aorta and IVC. No restricted diffusion Other:  No ascites. Musculoskeletal: Mild degenerative changes along the spine. IMPRESSION: 2.1 cm lobular enhancing lesion in segment 4 just above the gallbladder fossa in the area of abnormality by prior imaging. Based on the overall appearance, as well as the fact that this has not seen in 2021, this could be an atypical malignancy. There is a differential including benign etiologies. Overall recommend further workup such as PET-CT scan as the next step. Percutaneous sampling would also be an option Question tubular ectasia of the kidneys. Question of a subtle filling defect along the upper pole calyx  on the left. Please correlate with any clinical symptoms such as hematuria. There is also a complex upper pole right-sided small renal lesion, possibly proteinaceous or hemorrhagic cysts. Otherwise recommend follow up CT with and without contrast in 6 months with delayed imaging. Findings  will be called to the ordering service by the Radiology physician assistant team. Electronically Signed   By: Karen Kays M.D.   On: 09/04/2022 15:23    Labs:  CBC: Recent Labs    11/25/21 1527 05/12/22 1549 09/11/22 1624  WBC 8.1 5.2 4.7  HGB 11.7* 12.9* 14.2  HCT 35.6* 39.8 42.1  PLT 291 312 298    COAGS: No results for input(s): "INR", "APTT" in the last 8760 hours.  BMP: Recent Labs    11/25/21 1527 05/12/22 1549 08/18/22 1623 09/11/22 1624  NA 140 138  --  136  K 4.7 4.8  --  4.2  CL 105 102  --  102  CO2 22 21  --  25  GLUCOSE 232* 102*  --  89  BUN 10 13  --  18  CALCIUM 9.4 10.1  --  10.0  CREATININE 1.13 1.19 1.20 1.18  GFRNONAA  --   --   --  >60    LIVER FUNCTION TESTS: Recent Labs    11/25/21 1527 05/12/22 1549 09/11/22 1624  BILITOT 0.3 0.3 0.7  AST 12 24 23   ALT 18 28 20   ALKPHOS 103 82 77  PROT 5.8* 6.4 7.9  ALBUMIN 3.9 4.4 4.9    TUMOR MARKERS: No results for input(s): "AFPTM", "CEA", "CA199", "CHROMGRNA" in the last 8760 hours.  Assessment and Plan:  Liver lesion: Arthur [name], 57 year old male, presents today to the [facility] IR department for an image-guided liver lesion biopsy.   Risks and benefits of this procedure were discussed with the patient and/or patient's family including, but not limited to bleeding, infection, damage to adjacent structures or low yield requiring additional tests.  All of the questions were answered and there is agreement to proceed. He has been NPO. He is a full code. Last dose of aspirin was approximately 1 week ago.   Consent signed and in chart.  Thank you for this interesting consult.  I greatly enjoyed meeting  Arthur [name] and look forward to participating in their care.  A copy of this report was sent to the requesting provider on this date.  Electronically Signed: Alwyn Ren, AGACNP-BC [phone] 09/23/2022, 9:18 AM   I spent a total of  30 Minutes   in face to face in clinical consultation, greater than 50% of which was counseling/coordinating care for liver lesion biopsy

## 2022-09-29 LAB — SURGICAL PATHOLOGY

## 2022-09-30 ENCOUNTER — Inpatient Hospital Stay (HOSPITAL_BASED_OUTPATIENT_CLINIC_OR_DEPARTMENT_OTHER): Payer: 59 | Admitting: Oncology

## 2022-09-30 ENCOUNTER — Encounter: Payer: Self-pay | Admitting: Oncology

## 2022-09-30 VITALS — BP 142/84 | HR 87 | Temp 96.4°F | Resp 18 | Ht 67.0 in | Wt 130.7 lb

## 2022-09-30 DIAGNOSIS — N289 Disorder of kidney and ureter, unspecified: Secondary | ICD-10-CM | POA: Diagnosis not present

## 2022-09-30 DIAGNOSIS — R160 Hepatomegaly, not elsewhere classified: Secondary | ICD-10-CM | POA: Diagnosis not present

## 2022-09-30 DIAGNOSIS — K769 Liver disease, unspecified: Secondary | ICD-10-CM | POA: Diagnosis not present

## 2022-10-01 ENCOUNTER — Ambulatory Visit (INDEPENDENT_AMBULATORY_CARE_PROVIDER_SITE_OTHER): Payer: 59 | Admitting: Family Medicine

## 2022-10-01 ENCOUNTER — Encounter: Payer: Self-pay | Admitting: Family Medicine

## 2022-10-01 VITALS — BP 137/84 | HR 88 | Temp 98.2°F | Ht 67.0 in | Wt 130.8 lb

## 2022-10-01 DIAGNOSIS — R300 Dysuria: Secondary | ICD-10-CM | POA: Diagnosis not present

## 2022-10-01 DIAGNOSIS — E1121 Type 2 diabetes mellitus with diabetic nephropathy: Secondary | ICD-10-CM

## 2022-10-01 DIAGNOSIS — Z794 Long term (current) use of insulin: Secondary | ICD-10-CM

## 2022-10-01 DIAGNOSIS — Z23 Encounter for immunization: Secondary | ICD-10-CM

## 2022-10-01 LAB — URINALYSIS, ROUTINE W REFLEX MICROSCOPIC
Bilirubin, UA: NEGATIVE
Glucose, UA: NEGATIVE
Ketones, UA: NEGATIVE
Leukocytes,UA: NEGATIVE
Nitrite, UA: NEGATIVE
RBC, UA: NEGATIVE
Specific Gravity, UA: 1.015 (ref 1.005–1.030)
Urobilinogen, Ur: 0.2 mg/dL (ref 0.2–1.0)
pH, UA: 6 (ref 5.0–7.5)

## 2022-10-01 LAB — MICROSCOPIC EXAMINATION
Bacteria, UA: NONE SEEN
RBC, Urine: NONE SEEN /hpf (ref 0–2)
WBC, UA: NONE SEEN /hpf (ref 0–5)

## 2022-10-01 NOTE — Assessment & Plan Note (Signed)
Tolerating his 12.5mg  of mounjaro well. Will recheck labs today. Await results. Treat as needed.

## 2022-10-01 NOTE — Progress Notes (Signed)
Findings: No bruising, erythema, lesion or rash.  Neurological:     General: No focal deficit present.     Mental Status: He is alert and oriented to person, place, and time. Mental status is at baseline.  Psychiatric:        Mood and Affect: Mood normal.        Behavior: Behavior normal.        Thought Content: Thought content normal.        Judgment: Judgment normal.     Results for orders placed or performed during the hospital encounter of 09/23/22  CBC upon arrival  Result Value Ref Range   WBC 4.9 4.0 - 10.5 K/uL   RBC 5.34 4.22 - 5.81 MIL/uL   Hemoglobin 14.8 13.0 - 17.0 g/dL   HCT 16.1 09.6 - 04.5 %   MCV 82.6 80.0 - 100.0 fL   MCH 27.7 26.0 - 34.0 pg   MCHC 33.6 30.0 - 36.0 g/dL   RDW 40.9 81.1 - 91.4 %   Platelets 313 150 - 400 K/uL   nRBC 0.0 0.0 - 0.2 %  Protime-INR upon arrival  Result Value Ref Range   Prothrombin Time 13.5 11.4 - 15.2 seconds   INR 1.0 0.8 - 1.2  Glucose, capillary   Result Value Ref Range   Glucose-Capillary 150 (H) 70 - 99 mg/dL  Surgical pathology  Result Value Ref Range   SURGICAL PATHOLOGY      SURGICAL PATHOLOGY [facility] [address], [address] [place], [place] [address] Telephone [phone] or [phone] Fax [phone]  REPORT OF SURGICAL PATHOLOGY   Accession #: [id number] Patient Name: Arthur [name], Arthur [name] Visit # : [id number]  MRN: [id number] Physician: Acquanetta Belling DOB/Age 10-28-1965 (Age: 57) Gender: M Collected Date: 09/23/2022 Received Date: 09/23/2022  FINAL DIAGNOSIS       1. Liver, needle/core biopsy, Right mass :       - HEPATIC LESION WITH FOCAL TELANGIECTASIA AND FIBROUS BANDS CONTAINING      PROMINENT VESSELS AND FOCAL PERIPHERAL BILE DUCTULAR REACTION.      - SEE NOTE.       Diagnosis Note : Per [facility] the patient has a 2.1 cm lobular enhancing lesion in      segment 4 of the liver. CEA, AFP, and CA19-9 are within normal limits. Sections      demonstrate hepatic parenchyma with fibrous bands containing prominent vessels      and focal peripheral bile ductular reaction. Focal telangiectasia and      pseudoros ette formation is noted. Reticulin stains highlight intact hepatic      plates. Ki-67 stains demonstrate slightly increased Ki-67 staining as compared      to focal normal adjacent hepatic parenchyma. The differential diagnosis for      these findings includes focal nodular hyperplasia and a hepatic adenoma      (especially the inflammatory subtype). Correlation with radiographic findings      and clinical impression is required. Stain controls worked appropriately.      DATE SIGNED OUT: 09/29/2022 ELECTRONIC SIGNATURE : Oneita Kras Md, Delice Bison , Pathologist, Electronic Signature  MICROSCOPIC DESCRIPTION  CASE COMMENTS STAINS USED IN DIAGNOSIS: H&E H&E Universal Negative Control-DAB Stains used in diagnosis 1 KI-67-NO ACIS, 1 Reticulin Stain, 1 KI-67-NO ACIS, 1 Reticulin Stain IHC  stains are performed on formalin fixed,paraffin embedded tissue using a 3,3"diaminobenzidine (DAB) chromogen and Leica Bond Autostainer System. The staining intensity of the nucleus is score  manually and is reported  BP 137/84   Pulse 88   Temp 98.2 F (36.8 C) (Oral)   Ht 5\' 7"  (1.702 m)   Wt 130 lb 12.8 oz (59.3 kg)   SpO2 98%   BMI 20.49 kg/m    Subjective:    Patient ID: Arthur [name], male    DOB: 26-Apr-1965, 57 y.o.   MRN: [id number]  HPI: Arthur [name] is a 57 y.o. male  Chief Complaint  Patient presents with   Diabetes    Pt has been tolerating well 12.5 mg dose     DIABETES Hypoglycemic episodes:no Polydipsia/polyuria: no Visual disturbance: no Chest pain: no Paresthesias: no Glucose Monitoring: yes  Accucheck frequency: continuous  Fasting glucose: 140s Taking Insulin?: no Blood Pressure Monitoring: not checking Retinal Examination: Up to Date Foot Exam: Up to Date Diabetic Education: Completed Pneumovax: Up to Date Influenza: Up to Date Aspirin: yes  Relevant past medical, surgical, family and social history reviewed and updated as indicated. Interim medical history since our last visit reviewed. Allergies and medications reviewed and updated.  Review of Systems  Constitutional: Negative.   Respiratory: Negative.    Cardiovascular: Negative.   Gastrointestinal: Negative.   Musculoskeletal: Negative.   Psychiatric/Behavioral: Negative.      Per HPI unless specifically indicated above     Objective:    BP 137/84   Pulse 88   Temp 98.2 F (36.8 C) (Oral)   Ht 5\' 7"  (1.702 m)   Wt 130 lb 12.8 oz (59.3 kg)   SpO2 98%   BMI 20.49 kg/m   Wt Readings from Last 3 Encounters:  10/01/22 130 lb 12.8 oz (59.3 kg)  09/30/22 130 lb 11.2 oz (59.3 kg)  09/23/22 129 lb (58.5 kg)    Physical Exam Vitals and nursing note reviewed.  Constitutional:      General: He is not in acute distress.    Appearance: Normal appearance. He is not ill-appearing, toxic-appearing or diaphoretic.  HENT:     Head: Normocephalic and atraumatic.     Right Ear: External ear normal.     Left Ear: External ear normal.     Nose: Nose normal.     Mouth/Throat:     Mouth:  Mucous membranes are moist.     Pharynx: Oropharynx is clear.  Eyes:     General: No scleral icterus.       Right eye: No discharge.        Left eye: No discharge.     Extraocular Movements: Extraocular movements intact.     Conjunctiva/sclera: Conjunctivae normal.     Pupils: Pupils are equal, round, and reactive to light.  Cardiovascular:     Rate and Rhythm: Normal rate and regular rhythm.     Pulses: Normal pulses.     Heart sounds: Normal heart sounds. No murmur heard.    No friction rub. No gallop.  Pulmonary:     Effort: Pulmonary effort is normal. No respiratory distress.     Breath sounds: Normal breath sounds. No stridor. No wheezing, rhonchi or rales.  Chest:     Chest wall: No tenderness.  Musculoskeletal:        General: Normal range of motion.     Cervical back: Normal range of motion and neck supple.  Skin:    General: Skin is warm and dry.     Capillary Refill: Capillary refill takes less than 2 seconds.     Coloration: Skin is not jaundiced or pale.  BP 137/84   Pulse 88   Temp 98.2 F (36.8 C) (Oral)   Ht 5\' 7"  (1.702 m)   Wt 130 lb 12.8 oz (59.3 kg)   SpO2 98%   BMI 20.49 kg/m    Subjective:    Patient ID: Arthur [name], male    DOB: 26-Apr-1965, 57 y.o.   MRN: [id number]  HPI: Arthur [name] is a 57 y.o. male  Chief Complaint  Patient presents with   Diabetes    Pt has been tolerating well 12.5 mg dose     DIABETES Hypoglycemic episodes:no Polydipsia/polyuria: no Visual disturbance: no Chest pain: no Paresthesias: no Glucose Monitoring: yes  Accucheck frequency: continuous  Fasting glucose: 140s Taking Insulin?: no Blood Pressure Monitoring: not checking Retinal Examination: Up to Date Foot Exam: Up to Date Diabetic Education: Completed Pneumovax: Up to Date Influenza: Up to Date Aspirin: yes  Relevant past medical, surgical, family and social history reviewed and updated as indicated. Interim medical history since our last visit reviewed. Allergies and medications reviewed and updated.  Review of Systems  Constitutional: Negative.   Respiratory: Negative.    Cardiovascular: Negative.   Gastrointestinal: Negative.   Musculoskeletal: Negative.   Psychiatric/Behavioral: Negative.      Per HPI unless specifically indicated above     Objective:    BP 137/84   Pulse 88   Temp 98.2 F (36.8 C) (Oral)   Ht 5\' 7"  (1.702 m)   Wt 130 lb 12.8 oz (59.3 kg)   SpO2 98%   BMI 20.49 kg/m   Wt Readings from Last 3 Encounters:  10/01/22 130 lb 12.8 oz (59.3 kg)  09/30/22 130 lb 11.2 oz (59.3 kg)  09/23/22 129 lb (58.5 kg)    Physical Exam Vitals and nursing note reviewed.  Constitutional:      General: He is not in acute distress.    Appearance: Normal appearance. He is not ill-appearing, toxic-appearing or diaphoretic.  HENT:     Head: Normocephalic and atraumatic.     Right Ear: External ear normal.     Left Ear: External ear normal.     Nose: Nose normal.     Mouth/Throat:     Mouth:  Mucous membranes are moist.     Pharynx: Oropharynx is clear.  Eyes:     General: No scleral icterus.       Right eye: No discharge.        Left eye: No discharge.     Extraocular Movements: Extraocular movements intact.     Conjunctiva/sclera: Conjunctivae normal.     Pupils: Pupils are equal, round, and reactive to light.  Cardiovascular:     Rate and Rhythm: Normal rate and regular rhythm.     Pulses: Normal pulses.     Heart sounds: Normal heart sounds. No murmur heard.    No friction rub. No gallop.  Pulmonary:     Effort: Pulmonary effort is normal. No respiratory distress.     Breath sounds: Normal breath sounds. No stridor. No wheezing, rhonchi or rales.  Chest:     Chest wall: No tenderness.  Musculoskeletal:        General: Normal range of motion.     Cervical back: Normal range of motion and neck supple.  Skin:    General: Skin is warm and dry.     Capillary Refill: Capillary refill takes less than 2 seconds.     Coloration: Skin is not jaundiced or pale.

## 2022-10-01 NOTE — Progress Notes (Signed)
Referral sent to [facility] surgery at [facility] and [facility]. Will follow.

## 2022-10-01 NOTE — Progress Notes (Signed)
Hematology/Oncology Consult note [facility]  [phone] [phone]  Patient Care Team: Dorcas Carrow, DO as PCP - General (Family Medicine) Debbe Odea, MD as PCP - Cardiology (Cardiology) Tedd Sias, Marlana Salvage, MD as Consulting Physician (Endocrinology) Lajean Manes, [facility] (Inactive) (Pharmacist) Benita Gutter, RN as Oncology Nurse Navigator   Name of the patient: Arthur [name]  [id number]  08-06-1965   Date of visit: 10/01/22  Diagnosis-patient focal nodular hyperplasia versus hepatic adenoma  Chief complaint/ Reason for visit-discuss final pathology results and further management  Heme/Onc history: Patient is a 57 year old male with a past medical history significant for hypertension hyperlipidemia and type 2 diabetes who underwent a CT abdomen for symptoms of abdominal pain which showed a 2 mm right lung nodule and an indeterminate enhancing nodule in the liver. This was followed by MRI liver with and without contrast which showed a T2 bright lesion measuring 2.1 x 1.6 cm which was not present on prior imaging in 2021. Etiology unclear. He was also noted to have a filling defect in the upper pole of the calyx on the left side and a complex right sided small renal lesion possibly a hemorrhagic cyst.   Liver biopsy showed:- HEPATIC LESION WITH FOCAL TELANGIECTASIA AND FIBROUS BANDS CONTAINING       PROMINENT VESSELS AND FOCAL PERIPHERAL BILE DUCTULAR REACTION.   Sections demonstrate hepatic parenchyma with fibrous bands containing prominent vessels  and focal peripheral bile ductular reaction. Focal telangiectasia and pseudorosette formation is noted. Reticulin stains highlight intact hepatic plates. Ki-67 stains demonstrate slightly increased Ki-67 staining as compared to focal normal adjacent hepatic parenchyma. The differential diagnosis for these findings includes focal nodular hyperplasia and a hepatic adenoma (especially the  inflammatory subtype).   Interval history- no acute issues since last visit  ECOG PS- 0 Pain scale- 0   Review of systems- Review of Systems  Constitutional:  Negative for chills, fever, malaise/fatigue and weight loss.  HENT:  Negative for congestion, ear discharge and nosebleeds.   Eyes:  Negative for blurred vision.  Respiratory:  Negative for cough, hemoptysis, sputum production, shortness of breath and wheezing.   Cardiovascular:  Negative for chest pain, palpitations, orthopnea and claudication.  Gastrointestinal:  Negative for abdominal pain, blood in stool, constipation, diarrhea, heartburn, melena, nausea and vomiting.  Genitourinary:  Negative for dysuria, flank pain, frequency, hematuria and urgency.  Musculoskeletal:  Negative for back pain, joint pain and myalgias.  Skin:  Negative for rash.  Neurological:  Negative for dizziness, tingling, focal weakness, seizures, weakness and headaches.  Endo/Heme/Allergies:  Does not bruise/bleed easily.  Psychiatric/Behavioral:  Negative for depression and suicidal ideas. The patient does not have insomnia.       Allergies  Allergen Reactions   Codeine Nausea Only   Hydrocodone Nausea Only   Oxycodone Nausea Only     Past Medical History:  Diagnosis Date   Diabetes mellitus with renal complications (HCC)    Erectile dysfunction associated with type 2 diabetes mellitus (HCC)    Exotropia    History of tobacco abuse    Hyperlipidemia    Hypertension    Thoracic back pain      Past Surgical History:  Procedure Laterality Date   COLONOSCOPY WITH PROPOFOL N/A 01/25/2019   Procedure: COLONOSCOPY WITH PROPOFOL;  Surgeon: Toney Reil, MD;  Location: [facility] ENDOSCOPY;  Service: Gastroenterology;  Laterality: N/A;  Positive on 12/22/2018   ESOPHAGOGASTRODUODENOSCOPY (EGD) WITH PROPOFOL N/A 07/14/2022   Procedure: ESOPHAGOGASTRODUODENOSCOPY (  EGD) WITH PROPOFOL;  Surgeon: Wyline Mood, MD;  Location: [facility] ENDOSCOPY;  Service:  Gastroenterology;  Laterality: N/A;   HIP SURGERY     SHOULDER SURGERY Left    bone spurs removed    Social History   Socioeconomic History   Marital status: Married    Spouse name: Not on file   Number of children: Not on file   Years of education: Not on file   Highest education level: Not on file  Occupational History   Not on file  Tobacco Use   Smoking status: Former    Current packs/day: 0.00    Average packs/day: 1 pack/day for 20.0 years (20.0 ttl pk-yrs)    Types: Cigarettes    Start date: 01/05/1966    Quit date: 01/05/1986    Years since quitting: 36.7   Smokeless tobacco: Never  Vaping Use   Vaping status: Never Used  Substance and Sexual Activity   Alcohol use: No   Drug use: No   Sexual activity: Yes  Other Topics Concern   Not on file  Social History Narrative   Not on file   Social Determinants of Health   Financial Resource Strain: Not on file  Food Insecurity: Not on file  Transportation Needs: Not on file  Physical Activity: Not on file  Stress: Not on file  Social Connections: Unknown (05/20/2021)   [facility], [facility]   Social Network    Social Network: Not on file  Intimate Partner Violence: Unknown (04/11/2021)   Received from [facility], [facility]   HITS    Physically Hurt: Not on file    Insult or Talk Down To: Not on file    Threaten Physical Harm: Not on file    Scream or Curse: Not on file    Family History  Problem Relation Age of Onset   Diabetes Father    Lung disease Mother    Diabetes Sister    Diabetes Maternal Aunt      Current Outpatient Medications:    ASPIRIN LOW DOSE 81 MG tablet, TAKE 1 TABLET(81 MG) BY MOUTH DAILY, Disp: 90 tablet, Rfl: 0   atorvastatin (LIPITOR) 20 MG tablet, Take 1 tablet (20 mg total) by mouth daily., Disp: 90 tablet, Rfl: 1   Continuous Glucose Sensor (FREESTYLE LIBRE 2 SENSOR) MISC, Use as directed, Disp: 30 each, Rfl: 12   Continuous Glucose Sensor (FREESTYLE  LIBRE SENSOR SYSTEM) MISC, AS DIRECTED, Disp: 6 each, Rfl: 4   glucose blood test strip, 1 each by Other route as needed for other. One touch ultra test strips. Use as instructed., Disp: 100 each, Rfl: 12   Insulin Pen Needle (NOVOFINE PLUS) 32G X 4 MM MISC, Use one needle daily with insulin, Disp: 100 each, Rfl: 4   ipratropium (ATROVENT) 0.06 % nasal spray, Place 2 sprays into both nostrils 4 (four) times daily., Disp: 15 mL, Rfl: 12   losartan (COZAAR) 25 MG tablet, TAKE 1/2 TABLET(12.5 MG) BY MOUTH DAILY, Disp: 45 tablet, Rfl: 1   metFORMIN (GLUCOPHAGE) 1000 MG tablet, TAKE 1 TABLET(1000 MG) BY MOUTH TWICE DAILY, Disp: 180 tablet, Rfl: 1   sildenafil (VIAGRA) 100 MG tablet, TAKE 1/2 TO 1 (ONE-HALF TO ONE) TABLET BY MOUTH ONCE DAILY AS NEEDED FOR ERECTILE DYSFUNCTION, Disp: 10 tablet, Rfl: 12   tirzepatide (MOUNJARO) 12.5 MG/0.5ML Pen, Inject 12.5 mg into the skin once a week., Disp: 6 mL, Rfl: 1   omeprazole (PRILOSEC) 40 MG capsule, Take 1 capsule (  40 mg total) by mouth daily., Disp: 90 capsule, Rfl: 1   simethicone (GAS-X) 80 MG chewable tablet, Chew 1 tablet (80 mg total) by mouth every 6 (six) hours as needed for flatulence., Disp: 120 tablet, Rfl: 2  Physical exam:  Vitals:   09/30/22 1413  BP: (!) 142/84  Pulse: 87  Resp: 18  Temp: (!) 96.4 F (35.8 C)  TempSrc: Tympanic  SpO2: 100%  Weight: 130 lb 11.2 oz (59.3 kg)  Height: 5\' 7"  (1.702 m)   Physical Exam Cardiovascular:     Rate and Rhythm: Normal rate and regular rhythm.     Heart sounds: Normal heart sounds.  Pulmonary:     Effort: Pulmonary effort is normal.  Skin:    General: Skin is warm and dry.  Neurological:     Mental Status: He is alert and oriented to person, place, and time.        Latest Ref Rng & Units 09/11/2022    4:24 PM  CMP  Glucose 70 - 99 mg/dL 89   BUN 6 - 20 mg/dL 18   Creatinine 1.61 - 1.24 mg/dL 0.96   Sodium 045 - 409 mmol/L 136   Potassium 3.5 - 5.1 mmol/L 4.2   Chloride 98 - 111  mmol/L 102   CO2 22 - 32 mmol/L 25   Calcium 8.9 - 10.3 mg/dL 81.1   Total Protein 6.5 - 8.1 g/dL 7.9   Total Bilirubin 0.3 - 1.2 mg/dL 0.7   Alkaline Phos 38 - 126 U/L 77   AST 15 - 41 U/L 23   ALT 0 - 44 U/L 20       Latest Ref Rng & Units 09/23/2022    9:02 AM  CBC  WBC 4.0 - 10.5 K/uL 4.9   Hemoglobin 13.0 - 17.0 g/dL 91.4   Hematocrit 78.2 - 52.0 % 44.1   Platelets 150 - 400 K/uL 313     No images are attached to the encounter.  US BIOPSY (LIVER)  Result Date: 09/24/2022 INDICATION: 57 year old gentleman with new enhancing right liver mass presents to IR for ultrasound-guided biopsy. EXAM: 1. Ultrasound-guided biopsy of right liver mass 2. Contrast-enhanced ultrasound of the liver for identification of liver mass MEDICATIONS: None. ANESTHESIA/SEDATION: Moderate (conscious) sedation was employed during this procedure. A total of Versed 1.5 mg and Fentanyl 75 mcg was administered intravenously by the radiology nurse. Total intra-service moderate Sedation Time: 12 minutes. The patient's level of consciousness and vital signs were monitored continuously by radiology nursing throughout the procedure under my direct supervision. COMPLICATIONS: None immediate. PROCEDURE: Informed written consent was obtained from the patient after a thorough discussion of the procedural risks, benefits and alternatives. All questions were addressed. Maximal Sterile Barrier Technique was utilized including caps, mask, sterile gowns, sterile gloves, sterile drape, hand hygiene and skin antiseptic. A timeout was performed prior to the initiation of the procedure. Patient position supine on the ultrasound table. Right upper quadrant skin prepped and draped in the usual sterile fashion. The liver lesion was quite subtle and difficult to identify with grayscale and Doppler imaging. It was identified with more confidence after administration of IV ultrasound contrast. Following local lidocaine administration, 17 gauge  introducer needle was advanced into the right liver lesion, and three 18 gauge cores were obtained utilizing continuous ultrasound guidance. Samples were sent to pathology in formalin. Needle removed and hemostasis achieved with 5 minutes of manual compression. Post procedure ultrasound images showed no evidence of significant hemorrhage. IMPRESSION: Ultrasound-guided biopsy  of right liver mass. Electronically Signed   By: Acquanetta Belling M.D.   On: 09/24/2022 09:57   US LIVER W/CM 1ST LESION  Result Date: 09/24/2022 INDICATION: 57 year old gentleman with new enhancing right liver mass presents to IR for ultrasound-guided biopsy. EXAM: 1. Ultrasound-guided biopsy of right liver mass 2. Contrast-enhanced ultrasound of the liver for identification of liver mass MEDICATIONS: None. ANESTHESIA/SEDATION: Moderate (conscious) sedation was employed during this procedure. A total of Versed 1.5 mg and Fentanyl 75 mcg was administered intravenously by the radiology nurse. Total intra-service moderate Sedation Time: 12 minutes. The patient's level of consciousness and vital signs were monitored continuously by radiology nursing throughout the procedure under my direct supervision. COMPLICATIONS: None immediate. PROCEDURE: Informed written consent was obtained from the patient after a thorough discussion of the procedural risks, benefits and alternatives. All questions were addressed. Maximal Sterile Barrier Technique was utilized including caps, mask, sterile gowns, sterile gloves, sterile drape, hand hygiene and skin antiseptic. A timeout was performed prior to the initiation of the procedure. Patient position supine on the ultrasound table. Right upper quadrant skin prepped and draped in the usual sterile fashion. The liver lesion was quite subtle and difficult to identify with grayscale and Doppler imaging. It was identified with more confidence after administration of IV ultrasound contrast. Following local lidocaine  administration, 17 gauge introducer needle was advanced into the right liver lesion, and three 18 gauge cores were obtained utilizing continuous ultrasound guidance. Samples were sent to pathology in formalin. Needle removed and hemostasis achieved with 5 minutes of manual compression. Post procedure ultrasound images showed no evidence of significant hemorrhage. IMPRESSION: Ultrasound-guided biopsy of right liver mass. Electronically Signed   By: Acquanetta Belling M.D.   On: 09/24/2022 09:57     Assessment and plan- Patient is a 57 y.o. male who is here for following issues:  Liver mass: Discussed liver biopsy results Which shows areas of focal telangiectasia and fibrous bands with predominant vessels and peripheral bile ductular reaction.  The differential for these findings include focal nodular hyperplasia versus hepatic adenoma especially the inflammatory subtype.  Focal nodular hyperplasia is a benign lesion however adenoma especially in males can be considered as a precancerous condition and resection is recommended for hepatic adenomas of all size and males.  Patient did not have this lesion back in 2021 and therefore surgical resection is definitely a consideration at this time.  I am referring him to [facility] for the same.  Patient also noted to have a filling defect in the upper pole of the calyx on the left and would need to be referred to urology for the same  No f/u with me needed. Further f/u to be decided based on [facility] recommendations   Visit Diagnosis 1. Renal lesion   2. Liver mass      Dr. Owens Shark, MD, MPH [facility] at [facility] [phone] 10/01/2022 11:13 AM

## 2022-10-02 LAB — HGB A1C W/O EAG: Hgb A1c MFr Bld: 7.2 % — ABNORMAL HIGH (ref 4.8–5.6)

## 2022-10-07 ENCOUNTER — Encounter: Payer: Self-pay | Admitting: Urology

## 2022-10-07 ENCOUNTER — Ambulatory Visit (INDEPENDENT_AMBULATORY_CARE_PROVIDER_SITE_OTHER): Payer: 59 | Admitting: Urology

## 2022-10-07 VITALS — BP 138/81 | HR 96 | Ht 67.0 in | Wt 130.0 lb

## 2022-10-07 DIAGNOSIS — N289 Disorder of kidney and ureter, unspecified: Secondary | ICD-10-CM

## 2022-10-07 DIAGNOSIS — R93421 Abnormal radiologic findings on diagnostic imaging of right kidney: Secondary | ICD-10-CM | POA: Diagnosis not present

## 2022-10-07 NOTE — H&P (View-Only) (Signed)
I, Maysun Anabel Bene, acting as a scribe for Riki Altes, MD., have documented all relevant documentation on the behalf of Riki Altes, MD, as directed by Riki Altes, MD while in the presence of Riki Altes, MD.  10/07/2022 12:40 PM   Arthur [name] 1965-02-27 [id number]  Referring provider: Creig Hines, MD [address] [place],  [place] [address]  Chief Complaint  Patient presents with   Other   Urologic history:  Peyronie's disease  HPI: Arthur [name] is a 57 y.o. male referred for evaluation of abnormal CT.  CT abdomen pelvis with contrast performed 08/18/22 for evaluation of weight loss and abdominal pain. On the initial reading no significant GU abnormalities noted. There was a 1cm right renal cyst. He was noted to have an indeterminate enhancing nodule in the liver and underwent MRI of the liver with and without contrast for further evaluation of this liver lesion.  On MRI, he was noted to have bilateral lesions consistent with benign cysts with  a probable Bosniak II cyst in the upper pole of the right kidney. On review of the prior CT, it was felt to be a subtle filling defect along the anterior upper pole renal calyx of uncertain etiology. He had cystic-like appearance to the calyces bilaterally and was referred for further evaluation of this abnormality. Liver biopsy was performed which showed focal nodular hyperplasia versus a hepatic adenoma. Denies gross hematuria and urinalysis 10/01/22 showed no microscopic hematuria   PMH: Past Medical History:  Diagnosis Date   Diabetes mellitus with renal complications (HCC)    Erectile dysfunction associated with type 2 diabetes mellitus (HCC)    Exotropia    History of tobacco abuse    Hyperlipidemia    Hypertension    Thoracic back pain     Surgical History: Past Surgical History:  Procedure Laterality Date   COLONOSCOPY WITH PROPOFOL N/A 01/25/2019   Procedure: COLONOSCOPY WITH PROPOFOL;   Surgeon: Toney Reil, MD;  Location: [facility] ENDOSCOPY;  Service: Gastroenterology;  Laterality: N/A;  Positive on 12/22/2018   ESOPHAGOGASTRODUODENOSCOPY (EGD) WITH PROPOFOL N/A 07/14/2022   Procedure: ESOPHAGOGASTRODUODENOSCOPY (EGD) WITH PROPOFOL;  Surgeon: Wyline Mood, MD;  Location: [facility]OSCOPY;  Service: Gastroenterology;  Laterality: N/A;   HIP SURGERY     SHOULDER SURGERY Left    bone spurs removed    Home Medications:  Allergies as of 10/07/2022       Reactions   Codeine Nausea Only   Hydrocodone Nausea Only   Oxycodone Nausea Only        Medication List        Accurate as of October 07, 2022 12:40 PM. If you have any questions, ask your nurse or doctor.          STOP taking these medications    FreeStyle Libre 2 Sensor Misc Stopped by: Riki Altes   FreeStyle [employer] Stopped by: Riki Altes   NovoFine Plus 32G X 4 MM Misc Generic drug: Insulin Pen Needle Stopped by: Riki Altes   sildenafil 100 MG tablet Commonly known as: VIAGRA Stopped by: Riki Altes   simethicone 80 MG chewable tablet Commonly known as: Gas-X Stopped by: Riki Altes       TAKE these medications    Aspirin Low Dose 81 MG tablet Generic drug: aspirin EC TAKE 1 TABLET(81 MG) BY MOUTH DAILY   atorvastatin 20 MG tablet Commonly known as: LIPITOR Take  1 tablet (20 mg total) by mouth daily.   glucose blood test strip 1 each by Other route as needed for other. One touch ultra test strips. Use as instructed.   ipratropium 0.06 % nasal spray Commonly known as: ATROVENT Place 2 sprays into both nostrils 4 (four) times daily.   losartan 25 MG tablet Commonly known as: COZAAR TAKE 1/2 TABLET(12.5 MG) BY MOUTH DAILY   metFORMIN 1000 MG tablet Commonly known as: GLUCOPHAGE TAKE 1 TABLET(1000 MG) BY MOUTH TWICE DAILY   omeprazole 40 MG capsule Commonly known as: PRILOSEC Take 1 capsule (40 mg total) by mouth daily.   tirzepatide  12.5 MG/0.5ML Pen Commonly known as: MOUNJARO Inject 12.5 mg into the skin once a week.        Allergies:  Allergies  Allergen Reactions   Codeine Nausea Only   Hydrocodone Nausea Only   Oxycodone Nausea Only    Family History: Family History  Problem Relation Age of Onset   Diabetes Father    Lung disease Mother    Diabetes Sister    Diabetes Maternal Aunt     Social History:  reports that he quit smoking about 36 years ago. His smoking use included cigarettes. He started smoking about 56 years ago. He has a 20 pack-year smoking history. He has never used smokeless tobacco. He reports that he does not drink alcohol and does not use drugs.   Physical Exam: BP 138/81   Pulse 96   Ht 5\' 7"  (1.702 m)   Wt 130 lb (59 kg)   BMI 20.36 kg/m   Constitutional:  Alert and oriented, No acute distress. HEENT: [place] AT Respiratory: Normal respiratory effort, no increased work of breathing. Psychiatric: Normal mood and affect.   Pertinent Imaging: CT was personally reviewed and interpreted.   CT EXAM: CT ABDOMEN AND PELVIS WITH CONTRAST   TECHNIQUE: Multidetector CT imaging of the abdomen and pelvis was performed using the standard protocol following bolus administration of intravenous contrast.   RADIATION DOSE REDUCTION: This exam was performed according to the departmental dose-optimization program which includes automated exposure control, adjustment of the mA and/or kV according to patient size and/or use of iterative reconstruction technique.   CONTRAST:  OMNIPAQUE IOHEXOL 300 MG/ML  SOLN   COMPARISON:  05/05/2008   FINDINGS: Lower chest: 9 mm nodular lesion likely represents subsegmental atelectasis at the right base. Tiny mm sized nodule in the right lower lobe as well. Follow up 3-6 month CT recommended to ensure resolution as a precaution.   Hepatobiliary: Enhancing lesion in the right lobe of the liver measures 2.5 cm. This is most likely an  atypical hemangioma. Liver protocol CT or MRI can be done for further evaluation. No biliary ductal dilatation. Gallbladder is unremarkable.   Pancreas: Unremarkable. No pancreatic ductal dilatation or surrounding inflammatory changes.   Spleen: Normal in size without focal abnormality.   Adrenals/Urinary Tract: No adrenal lesions. 1 cm cyst right kidney. No imaging follow up for the cysts. No stones or hydronephrosis. Unremarkable urinary bladder.   Stomach/Bowel: Stomach is within normal limits. Appendix appears normal. No evidence of bowel wall thickening, distention, or inflammatory changes. Diverticulosis noted of the descending and sigmoid colon.   Vascular/Lymphatic: Aortic atherosclerosis. No enlarged abdominal or pelvic lymph nodes.   Reproductive: Prostate is unremarkable.   Other: No abdominal wall hernia or abnormality. No abdominopelvic ascites.   Musculoskeletal: No acute or significant osseous findings.   IMPRESSION: 1. 2 mm nodule right lung base. Additional  nodular lesion at the right base likely represents subsegmental atelectasis or scarring. 3-6 month CT follow-up recommended as a precaution. 2. Indeterminate enhancing nodule in the liver. Consider liver protocol CT or MRI for further evaluation. 3. Tiny right kidney cyst.     Electronically Signed   By: Layla Maw M.D.   On: 08/26/2022 16:22   MRI EXAM: MRI ABDOMEN WITHOUT AND WITH CONTRAST   TECHNIQUE: Multiplanar multisequence MR imaging of the abdomen was performed both before and after the administration of intravenous contrast.   CONTRAST:  6mL GADAVIST GADOBUTROL 1 MMOL/ML IV SOLN   COMPARISON:  CT 08/18/2022   FINDINGS: Lower chest: Lung bases are clear.   Hepatobiliary: No intrahepatic biliary ductal dilatation. Gallbladder is nondilated. Patent portal vein corresponding to the finding by CT is a focal lesion seen in segment 4 just above the gallbladder fossa. Lesion is  mildly bright on T2, slightly dark on T1 precontrast. Lesion is homogeneous in enhancement in the arterial phase and overall measures 2.1 by 1.6 cm on series 14, image 36. The margins on that series are slightly lobular. Lesion maintains enhancement above adjacent liver parenchyma on the dynamic. Lesion is visible on diffusion   Pancreas: Preserved pancreatic parenchyma. No pancreatic ductal dilatation. No abnormal pancreatic T1 signal.   Spleen:  Within normal limits in size and appearance.   Adrenals/Urinary Tract: Adrenal glands are preserved. Kidneys have some small, punctate bright T2 foci consistent with benign cystic foci. There is 1 lesion which is somewhat dark on T2 posterior in the upper pole of the right kidney measuring 9 mm. This has isointense on precontrast T1 and is not clearly enhancing. Possible proteinaceous or hemorrhagic Bosniak 2 lesion. On prior CT there is a subtle filling defect along the anterior upper pole left-sided renal calyx of uncertain etiology and significance. There is also a cystic like appearance to the calices bilaterally. Please correlate with any symptoms or follow up evaluation   Stomach/Bowel: Moderate colonic stool visualized small and large bowel are nondilated. There is some areas of small bowel loops with collapse or underdistention in the left hemiabdomen   Vascular/Lymphatic: Mild atherosclerotic changes along the aorta. Normal caliber aorta and IVC. No restricted diffusion   Other:  No ascites.   Musculoskeletal: Mild degenerative changes along the spine.   IMPRESSION: 2.1 cm lobular enhancing lesion in segment 4 just above the gallbladder fossa in the area of abnormality by prior imaging. Based on the overall appearance, as well as the fact that this has not seen in 2021, this could be an atypical malignancy. There is a differential including benign etiologies. Overall recommend further workup such as PET-CT scan as the next  step. Percutaneous sampling would also be an option   Question tubular ectasia of the kidneys. Question of a subtle filling defect along the upper pole calyx on the left. Please correlate with any clinical symptoms such as hematuria. There is also a complex upper pole right-sided small renal lesion, possibly proteinaceous or hemorrhagic cysts. Otherwise recommend follow up CT with and without contrast in 6 months with delayed imaging.   Findings will be called to the ordering service by the Radiology physician assistant team.     Electronically Signed   By: Karen Kays M.D.   On: 09/04/2022 15:23  Assessment & Plan:    1. Possible upper calyceal filling defect left kidney Discussed options of cystoscopy with left retrograde pyelogram and diagnostic ureteroscopy with possible biopsy. The procedure was discussed in detail,  including potential risks of bleeding, infection, and kidney injury. Possible need for ureteral stent with stent-related symptoms was also discussed. Possibility of failure to access the collecting system with the ureteroscope secondary to anatomy was also discussed.  The alternative of a follow-up CT urogram in 3 months was discussed.  He would like to proceed with cystoscopy with left retrograde pyelogram and possible ureteroscopy/biopsy.  Voided urine for urinalysis and cytology was sent today.    I have reviewed the above documentation for accuracy and completeness, and I agree with the above.   Riki Altes, MD  [facility] [address], [address] [place], [place] [address] [phone]

## 2022-10-07 NOTE — Progress Notes (Signed)
I, Maysun Anabel Bene, acting as a scribe for Riki Altes, MD., have documented all relevant documentation on the behalf of Riki Altes, MD, as directed by Riki Altes, MD while in the presence of Riki Altes, MD.  10/07/2022 12:40 PM   Arthur [name] 1965-02-27 [id number]  Referring provider: Creig Hines, MD [address] [place],  [place] [address]  Chief Complaint  Patient presents with   Other   Urologic history:  Peyronie's disease  HPI: Arthur [name] is a 57 y.o. male referred for evaluation of abnormal CT.  CT abdomen pelvis with contrast performed 08/18/22 for evaluation of weight loss and abdominal pain. On the initial reading no significant GU abnormalities noted. There was a 1cm right renal cyst. He was noted to have an indeterminate enhancing nodule in the liver and underwent MRI of the liver with and without contrast for further evaluation of this liver lesion.  On MRI, he was noted to have bilateral lesions consistent with benign cysts with  a probable Bosniak II cyst in the upper pole of the right kidney. On review of the prior CT, it was felt to be a subtle filling defect along the anterior upper pole renal calyx of uncertain etiology. He had cystic-like appearance to the calyces bilaterally and was referred for further evaluation of this abnormality. Liver biopsy was performed which showed focal nodular hyperplasia versus a hepatic adenoma. Denies gross hematuria and urinalysis 10/01/22 showed no microscopic hematuria   PMH: Past Medical History:  Diagnosis Date   Diabetes mellitus with renal complications (HCC)    Erectile dysfunction associated with type 2 diabetes mellitus (HCC)    Exotropia    History of tobacco abuse    Hyperlipidemia    Hypertension    Thoracic back pain     Surgical History: Past Surgical History:  Procedure Laterality Date   COLONOSCOPY WITH PROPOFOL N/A 01/25/2019   Procedure: COLONOSCOPY WITH PROPOFOL;   Surgeon: Toney Reil, MD;  Location: [facility] ENDOSCOPY;  Service: Gastroenterology;  Laterality: N/A;  Positive on 12/22/2018   ESOPHAGOGASTRODUODENOSCOPY (EGD) WITH PROPOFOL N/A 07/14/2022   Procedure: ESOPHAGOGASTRODUODENOSCOPY (EGD) WITH PROPOFOL;  Surgeon: Wyline Mood, MD;  Location: [facility]OSCOPY;  Service: Gastroenterology;  Laterality: N/A;   HIP SURGERY     SHOULDER SURGERY Left    bone spurs removed    Home Medications:  Allergies as of 10/07/2022       Reactions   Codeine Nausea Only   Hydrocodone Nausea Only   Oxycodone Nausea Only        Medication List        Accurate as of October 07, 2022 12:40 PM. If you have any questions, ask your nurse or doctor.          STOP taking these medications    FreeStyle Libre 2 Sensor Misc Stopped by: Riki Altes   FreeStyle [employer] Stopped by: Riki Altes   NovoFine Plus 32G X 4 MM Misc Generic drug: Insulin Pen Needle Stopped by: Riki Altes   sildenafil 100 MG tablet Commonly known as: VIAGRA Stopped by: Riki Altes   simethicone 80 MG chewable tablet Commonly known as: Gas-X Stopped by: Riki Altes       TAKE these medications    Aspirin Low Dose 81 MG tablet Generic drug: aspirin EC TAKE 1 TABLET(81 MG) BY MOUTH DAILY   atorvastatin 20 MG tablet Commonly known as: LIPITOR Take  1 tablet (20 mg total) by mouth daily.   glucose blood test strip 1 each by Other route as needed for other. One touch ultra test strips. Use as instructed.   ipratropium 0.06 % nasal spray Commonly known as: ATROVENT Place 2 sprays into both nostrils 4 (four) times daily.   losartan 25 MG tablet Commonly known as: COZAAR TAKE 1/2 TABLET(12.5 MG) BY MOUTH DAILY   metFORMIN 1000 MG tablet Commonly known as: GLUCOPHAGE TAKE 1 TABLET(1000 MG) BY MOUTH TWICE DAILY   omeprazole 40 MG capsule Commonly known as: PRILOSEC Take 1 capsule (40 mg total) by mouth daily.   tirzepatide  12.5 MG/0.5ML Pen Commonly known as: MOUNJARO Inject 12.5 mg into the skin once a week.        Allergies:  Allergies  Allergen Reactions   Codeine Nausea Only   Hydrocodone Nausea Only   Oxycodone Nausea Only    Family History: Family History  Problem Relation Age of Onset   Diabetes Father    Lung disease Mother    Diabetes Sister    Diabetes Maternal Aunt     Social History:  reports that he quit smoking about 36 years ago. His smoking use included cigarettes. He started smoking about 56 years ago. He has a 20 pack-year smoking history. He has never used smokeless tobacco. He reports that he does not drink alcohol and does not use drugs.   Physical Exam: BP 138/81   Pulse 96   Ht 5\' 7"  (1.702 m)   Wt 130 lb (59 kg)   BMI 20.36 kg/m   Constitutional:  Alert and oriented, No acute distress. HEENT: [place] AT Respiratory: Normal respiratory effort, no increased work of breathing. Psychiatric: Normal mood and affect.   Pertinent Imaging: CT was personally reviewed and interpreted.   CT EXAM: CT ABDOMEN AND PELVIS WITH CONTRAST   TECHNIQUE: Multidetector CT imaging of the abdomen and pelvis was performed using the standard protocol following bolus administration of intravenous contrast.   RADIATION DOSE REDUCTION: This exam was performed according to the departmental dose-optimization program which includes automated exposure control, adjustment of the mA and/or kV according to patient size and/or use of iterative reconstruction technique.   CONTRAST:  OMNIPAQUE IOHEXOL 300 MG/ML  SOLN   COMPARISON:  05/05/2008   FINDINGS: Lower chest: 9 mm nodular lesion likely represents subsegmental atelectasis at the right base. Tiny mm sized nodule in the right lower lobe as well. Follow up 3-6 month CT recommended to ensure resolution as a precaution.   Hepatobiliary: Enhancing lesion in the right lobe of the liver measures 2.5 cm. This is most likely an  atypical hemangioma. Liver protocol CT or MRI can be done for further evaluation. No biliary ductal dilatation. Gallbladder is unremarkable.   Pancreas: Unremarkable. No pancreatic ductal dilatation or surrounding inflammatory changes.   Spleen: Normal in size without focal abnormality.   Adrenals/Urinary Tract: No adrenal lesions. 1 cm cyst right kidney. No imaging follow up for the cysts. No stones or hydronephrosis. Unremarkable urinary bladder.   Stomach/Bowel: Stomach is within normal limits. Appendix appears normal. No evidence of bowel wall thickening, distention, or inflammatory changes. Diverticulosis noted of the descending and sigmoid colon.   Vascular/Lymphatic: Aortic atherosclerosis. No enlarged abdominal or pelvic lymph nodes.   Reproductive: Prostate is unremarkable.   Other: No abdominal wall hernia or abnormality. No abdominopelvic ascites.   Musculoskeletal: No acute or significant osseous findings.   IMPRESSION: 1. 2 mm nodule right lung base. Additional  nodular lesion at the right base likely represents subsegmental atelectasis or scarring. 3-6 month CT follow-up recommended as a precaution. 2. Indeterminate enhancing nodule in the liver. Consider liver protocol CT or MRI for further evaluation. 3. Tiny right kidney cyst.     Electronically Signed   By: Layla Maw M.D.   On: 08/26/2022 16:22   MRI EXAM: MRI ABDOMEN WITHOUT AND WITH CONTRAST   TECHNIQUE: Multiplanar multisequence MR imaging of the abdomen was performed both before and after the administration of intravenous contrast.   CONTRAST:  6mL GADAVIST GADOBUTROL 1 MMOL/ML IV SOLN   COMPARISON:  CT 08/18/2022   FINDINGS: Lower chest: Lung bases are clear.   Hepatobiliary: No intrahepatic biliary ductal dilatation. Gallbladder is nondilated. Patent portal vein corresponding to the finding by CT is a focal lesion seen in segment 4 just above the gallbladder fossa. Lesion is  mildly bright on T2, slightly dark on T1 precontrast. Lesion is homogeneous in enhancement in the arterial phase and overall measures 2.1 by 1.6 cm on series 14, image 36. The margins on that series are slightly lobular. Lesion maintains enhancement above adjacent liver parenchyma on the dynamic. Lesion is visible on diffusion   Pancreas: Preserved pancreatic parenchyma. No pancreatic ductal dilatation. No abnormal pancreatic T1 signal.   Spleen:  Within normal limits in size and appearance.   Adrenals/Urinary Tract: Adrenal glands are preserved. Kidneys have some small, punctate bright T2 foci consistent with benign cystic foci. There is 1 lesion which is somewhat dark on T2 posterior in the upper pole of the right kidney measuring 9 mm. This has isointense on precontrast T1 and is not clearly enhancing. Possible proteinaceous or hemorrhagic Bosniak 2 lesion. On prior CT there is a subtle filling defect along the anterior upper pole left-sided renal calyx of uncertain etiology and significance. There is also a cystic like appearance to the calices bilaterally. Please correlate with any symptoms or follow up evaluation   Stomach/Bowel: Moderate colonic stool visualized small and large bowel are nondilated. There is some areas of small bowel loops with collapse or underdistention in the left hemiabdomen   Vascular/Lymphatic: Mild atherosclerotic changes along the aorta. Normal caliber aorta and IVC. No restricted diffusion   Other:  No ascites.   Musculoskeletal: Mild degenerative changes along the spine.   IMPRESSION: 2.1 cm lobular enhancing lesion in segment 4 just above the gallbladder fossa in the area of abnormality by prior imaging. Based on the overall appearance, as well as the fact that this has not seen in 2021, this could be an atypical malignancy. There is a differential including benign etiologies. Overall recommend further workup such as PET-CT scan as the next  step. Percutaneous sampling would also be an option   Question tubular ectasia of the kidneys. Question of a subtle filling defect along the upper pole calyx on the left. Please correlate with any clinical symptoms such as hematuria. There is also a complex upper pole right-sided small renal lesion, possibly proteinaceous or hemorrhagic cysts. Otherwise recommend follow up CT with and without contrast in 6 months with delayed imaging.   Findings will be called to the ordering service by the Radiology physician assistant team.     Electronically Signed   By: Karen Kays M.D.   On: 09/04/2022 15:23  Assessment & Plan:    1. Possible upper calyceal filling defect left kidney Discussed options of cystoscopy with left retrograde pyelogram and diagnostic ureteroscopy with possible biopsy. The procedure was discussed in detail,  including potential risks of bleeding, infection, and kidney injury. Possible need for ureteral stent with stent-related symptoms was also discussed. Possibility of failure to access the collecting system with the ureteroscope secondary to anatomy was also discussed.  The alternative of a follow-up CT urogram in 3 months was discussed.  He would like to proceed with cystoscopy with left retrograde pyelogram and possible ureteroscopy/biopsy.  Voided urine for urinalysis and cytology was sent today.    I have reviewed the above documentation for accuracy and completeness, and I agree with the above.   Riki Altes, MD  [facility] [address], [address] [place], [place] [address] [phone]

## 2022-10-08 LAB — URINALYSIS, COMPLETE
Bilirubin, UA: NEGATIVE
Glucose, UA: NEGATIVE
Ketones, UA: NEGATIVE
Leukocytes,UA: NEGATIVE
Nitrite, UA: NEGATIVE
RBC, UA: NEGATIVE
Specific Gravity, UA: 1.02 (ref 1.005–1.030)
Urobilinogen, Ur: 0.2 mg/dL (ref 0.2–1.0)
pH, UA: 5.5 (ref 5.0–7.5)

## 2022-10-08 LAB — MICROSCOPIC EXAMINATION

## 2022-10-11 ENCOUNTER — Other Ambulatory Visit: Payer: Self-pay | Admitting: Urology

## 2022-10-11 DIAGNOSIS — N289 Disorder of kidney and ureter, unspecified: Secondary | ICD-10-CM

## 2022-10-12 ENCOUNTER — Telehealth: Payer: Self-pay

## 2022-10-12 ENCOUNTER — Other Ambulatory Visit: Payer: Self-pay

## 2022-10-12 DIAGNOSIS — R9341 Abnormal radiologic findings on diagnostic imaging of renal pelvis, ureter, or bladder: Secondary | ICD-10-CM

## 2022-10-12 NOTE — Telephone Encounter (Signed)
Per Dr. Lonna Cobb, Patient is to be scheduled for Left Retrograde Pyelogram, Left Diagnostic Ureteroscopy with possible Ureteral Biopsy   Mr. Marken was contacted and possible surgical dates were discussed, Tuesday October 22nd, 2024 was agreed upon for surgery.   Patient was instructed that Dr. Lonna Cobb will require them to provide a pre-op UA & CX prior to surgery. This was ordered and scheduled drop off appointment was made for 10/15/2022.    Patient was directed to call [phone] between 1-3pm the day before surgery to find out surgical arrival time.  Instructions were given not to eat or drink from midnight on the night before surgery and have a driver for the day of surgery. On the surgery day patient was instructed to enter through the [facility] entrance of [facility] report the Same Day Surgery desk.   Pre-Admit Testing will be in contact via phone to set up an interview with the anesthesia team to review your history and medications prior to surgery.   Reminder of this information was sent via MyChart to the patient.

## 2022-10-12 NOTE — Progress Notes (Signed)
Surgical Physician Order Form [facility]  Dr. Irineo Axon, MD  * Scheduling expectation : 10/22 or later depending on pt pref  *Length of Case: 60 min  *Clearance needed: no  *Anticoagulation Instructions: N/A  *Aspirin Instructions: Hold Aspirin  *Post-op visit Date/Instructions:  1 week cysto stent removal  *Diagnosis: filling defect left upper pole calyx  *Procedure: Left retrograde pyelogram, diagnostic left ureteroscopy with possible bx    Additional orders: N/A  -Admit type: OUTpatient  -Anesthesia: General  -VTE Prophylaxis Standing Order SCD's       Other:   -Standing Lab Orders Per Anesthesia    Lab other: UA&Urine Culture  -Standing Test orders EKG/Chest x-ray per Anesthesia       Test other:   - Medications:  Ancef 2gm IV  -Other orders:  N/A

## 2022-10-12 NOTE — Progress Notes (Signed)
   [facility]- Surgical Posting Form  Surgery Date: Date: 10/27/2022  Surgeon: Dr. Irineo Axon, MD  Inpt ( No  )   Outpt (Yes)   Obs ( No  )   Diagnosis: R93.41 Left Filling Defect of Upper Pole  -CPT: [phone], [phone], [phone]  Surgery: Left Retrograde Pyelogram, Left Diagnostic Ureteroscopy with possible Ureteral Biopsy  Stop Anticoagulations: No, Needs to hold ASA for 3 days  Cardiac/Medical/Pulmonary Clearance needed: no  Patient is aware to hold Mounjaro on Monday 10/21 prior to surgery  *Orders entered into EPIC  Date: 10/12/22   *Case booked in EPIC  Date: 10/12/22  *Notified pt of Surgery: Date: 10/12/22  PRE-OP UA & CX: yes, will obtain in clinic on 10/15/2022  *Placed into Prior Authorization Work Angela Nevin Date: 10/12/22  Assistant/laser/rep:No

## 2022-10-15 ENCOUNTER — Other Ambulatory Visit: Payer: 59

## 2022-10-15 DIAGNOSIS — R9341 Abnormal radiologic findings on diagnostic imaging of renal pelvis, ureter, or bladder: Secondary | ICD-10-CM

## 2022-10-15 LAB — URINALYSIS, COMPLETE
Bilirubin, UA: NEGATIVE
Glucose, UA: NEGATIVE
Ketones, UA: NEGATIVE
Nitrite, UA: NEGATIVE
RBC, UA: NEGATIVE
Specific Gravity, UA: 1.02 (ref 1.005–1.030)
Urobilinogen, Ur: 0.2 mg/dL (ref 0.2–1.0)
pH, UA: 5.5 (ref 5.0–7.5)

## 2022-10-15 LAB — MICROSCOPIC EXAMINATION: Epithelial Cells (non renal): >10 /[HPF] — AB (ref 0–10)

## 2022-10-18 LAB — CULTURE, URINE COMPREHENSIVE

## 2022-10-19 ENCOUNTER — Other Ambulatory Visit: Payer: Self-pay

## 2022-10-19 ENCOUNTER — Encounter
Admission: RE | Admit: 2022-10-19 | Discharge: 2022-10-19 | Disposition: A | Discharge status: Home / Self Care | Payer: 59 | Source: Ambulatory Visit | Attending: Urology | Admitting: Urology

## 2022-10-19 DIAGNOSIS — Z01812 Encounter for preprocedural laboratory examination: Secondary | ICD-10-CM

## 2022-10-19 DIAGNOSIS — E1121 Type 2 diabetes mellitus with diabetic nephropathy: Secondary | ICD-10-CM

## 2022-10-19 HISTORY — DX: Unspecified osteoarthritis, unspecified site: M19.90

## 2022-10-19 NOTE — Patient Instructions (Addendum)
Your procedure is scheduled on: 10/27/22 - Tuesday Report to the Registration Desk on the 1st floor of the [facility]. To find out your arrival time, please call [phone] between 1PM - 3PM on: 10/26/22 - Monday If your arrival time is 6:00 am, do not arrive before that time as the Medical Mall entrance doors do not open until 6:00 am.  REMEMBER: Instructions that are not followed completely may result in serious medical risk, up to and including death; or upon the discretion of your surgeon and anesthesiologist your surgery may need to be rescheduled.  Do not eat food or drink any liquids after midnight the night before surgery.  No gum chewing or hard candies.  One week prior to surgery beginning 10/20/22 :  - Stop Anti-inflammatories (NSAIDS) such as Advil, Aleve, Ibuprofen, Motrin, Naproxen, Naprosyn and Aspirin based products such as Excedrin, Goody's Powder, BC Powder. You may however, continue to take Tylenol if needed for pain up until the day of surgery.  - Stop ANY OVER THE COUNTER supplements until after surgery.  - metFORMIN (GLUCOPHAGE) beginning 10/25/22.  - tirzepatide [facility]) hold 7 days prior to your procedure.  - ASPIRIN LOW DOSE 81 hold beginning 10/24/22.  - losartan (COZAAR) hold on the day of surgery.    ON THE DAY OF SURGERY ONLY TAKE THESE MEDICATIONS WITH SIPS OF WATER:  omeprazole (PRILOSEC)    No Alcohol for 24 hours before or after surgery.  No Smoking including e-cigarettes for 24 hours before surgery.  No chewable tobacco products for at least 6 hours before surgery.  No nicotine patches on the day of surgery.  Do not use any "recreational" drugs for at least a week (preferably 2 weeks) before your surgery.  Please be advised that the combination of cocaine and anesthesia may have negative outcomes, up to and including death. If you test positive for cocaine, your surgery will be cancelled.  On the morning of surgery brush your teeth  with toothpaste and water, you may rinse your mouth with mouthwash if you wish. Do not swallow any toothpaste or mouthwash.   Do not wear jewelry, make-up, hairpins, clips or nail polish.  For welded (permanent) jewelry: bracelets, anklets, waist bands, etc.  Please have this removed prior to surgery.  If it is not removed, there is a chance that hospital personnel will need to cut it off on the day of surgery.  Do not wear lotions, powders, or perfumes.   Do not shave body hair from the neck down 48 hours before surgery.  Contact lenses, hearing aids and dentures may not be worn into surgery.  Do not bring valuables to the hospital. [facility] is not responsible for any missing/lost belongings or valuables.   Notify your doctor if there is any change in your medical condition (cold, fever, infection).  Wear comfortable clothing (specific to your surgery type) to the hospital.  After surgery, you can help prevent lung complications by doing breathing exercises.  Take deep breaths and cough every 1-2 hours. Your doctor may order a device called an Incentive Spirometer to help you take deep breaths. When coughing or sneezing, hold a pillow firmly against your incision with both hands. This is called "splinting." Doing this helps protect your incision. It also decreases belly discomfort.  If you are being admitted to the hospital overnight, leave your suitcase in the car. After surgery it may be brought to your room.  In case of increased patient census, it may be  necessary for you, the patient, to continue your postoperative care in the Same Day Surgery department.  If you are being discharged the day of surgery, you will not be allowed to drive home. You will need a responsible individual to drive you home and stay with you for 24 hours after surgery.   If you are taking public transportation, you will need to have a responsible individual with you.  Please call the Pre-admissions  Testing Dept. at [phone] if you have any questions about these instructions.  Surgery Visitation Policy:  Patients having surgery or a procedure may have two visitors.  Children under the age of 53 must have an adult with them who is not the patient.  Inpatient Visitation:    Visiting hours are 7 a.m. to 8 p.m. Up to four visitors are allowed at one time in a patient room. The visitors may rotate out with other people during the day.  One visitor age 63 or older may stay with the patient overnight and must be in the room by 8 p.m. Wear comfortable clothing (specific to your surgery type) to the hospital.  After surgery, you can help prevent lung complications by doing breathing exercises.  Take deep breaths and cough every 1-2 hours. Your doctor may order a device called an Incentive Spirometer to help you take deep breaths. When coughing or sneezing, hold a pillow firmly against your incision with both hands. This is called "splinting." Doing this helps protect your incision. It also decreases belly discomfort.  If you are being admitted to the hospital overnight, leave your suitcase in the car. After surgery it may be brought to your room.  In case of increased patient census, it may be necessary for you, the patient, to continue your postoperative care in the Same Day Surgery department.  If you are being discharged the day of surgery, you will not be allowed to drive home. You will need a responsible individual to drive you home and stay with you for 24 hours after surgery.   If you are taking public transportation, you will need to have a responsible individual with you.  Please call the Pre-admissions Testing Dept. at [phone] if you have any questions about these instructions.  Surgery Visitation Policy:  Patients having surgery or a procedure may have two visitors.  Children under the age of 49 must have an adult with them who is not the patient.  Inpatient  Visitation:    Visiting hours are 7 a.m. to 8 p.m. Up to four visitors are allowed at one time in a patient room. The visitors may rotate out with other people during the day.  One visitor age 46 or older may stay with the patient overnight and must be in the room by 8 p.m.

## 2022-10-23 ENCOUNTER — Encounter
Admission: RE | Admit: 2022-10-23 | Discharge: 2022-10-23 | Disposition: A | Discharge status: Home / Self Care | Payer: 59 | Source: Ambulatory Visit | Attending: Urology | Admitting: Urology

## 2022-10-23 ENCOUNTER — Encounter: Payer: Self-pay | Admitting: Urology

## 2022-10-23 DIAGNOSIS — I10 Essential (primary) hypertension: Secondary | ICD-10-CM | POA: Diagnosis not present

## 2022-10-23 DIAGNOSIS — I129 Hypertensive chronic kidney disease with stage 1 through stage 4 chronic kidney disease, or unspecified chronic kidney disease: Secondary | ICD-10-CM | POA: Insufficient documentation

## 2022-10-23 DIAGNOSIS — E785 Hyperlipidemia, unspecified: Secondary | ICD-10-CM | POA: Insufficient documentation

## 2022-10-23 DIAGNOSIS — Z01810 Encounter for preprocedural cardiovascular examination: Secondary | ICD-10-CM | POA: Diagnosis present

## 2022-10-23 DIAGNOSIS — R000 Tachycardia, unspecified: Secondary | ICD-10-CM | POA: Insufficient documentation

## 2022-10-26 MED ORDER — CHLORHEXIDINE GLUCONATE 0.12 % MT SOLN
15.0000 mL | Freq: Once | OROMUCOSAL | Status: AC
  Administered 2022-10-27: 15 mL via OROMUCOSAL

## 2022-10-26 MED ORDER — CEFAZOLIN SODIUM-DEXTROSE 2-4 GM/100ML-% IV SOLN
2.0000 g | INTRAVENOUS | Status: AC
  Administered 2022-10-27: 2 g via INTRAVENOUS

## 2022-10-26 MED ORDER — ORAL CARE MOUTH RINSE: 15.0000 mL | Freq: Once | OROMUCOSAL | Status: AC

## 2022-10-26 MED ORDER — SODIUM CHLORIDE 0.9 % IV SOLN: INTRAVENOUS | Status: DC

## 2022-10-26 NOTE — Plan of Care (Signed)
[facility] Tonsillectomy/Adenoidectomy, Postoperative PEDS care plan entered in error.

## 2022-10-27 ENCOUNTER — Ambulatory Visit
Admission: RE | Admit: 2022-10-27 | Discharge: 2022-10-27 | Disposition: A | Discharge status: Home / Self Care | Payer: 59 | Source: Ambulatory Visit | Attending: Urology | Admitting: Urology

## 2022-10-27 ENCOUNTER — Other Ambulatory Visit: Payer: Self-pay

## 2022-10-27 ENCOUNTER — Ambulatory Visit: Payer: 59 | Admitting: Urgent Care

## 2022-10-27 ENCOUNTER — Ambulatory Visit: Payer: 59

## 2022-10-27 ENCOUNTER — Encounter: Payer: Self-pay | Admitting: Urology

## 2022-10-27 ENCOUNTER — Encounter
Admission: RE | Disposition: A | Discharge status: Home / Self Care | Payer: Self-pay | Source: Ambulatory Visit | Attending: Urology

## 2022-10-27 DIAGNOSIS — Z7985 Long-term (current) use of injectable non-insulin antidiabetic drugs: Secondary | ICD-10-CM | POA: Diagnosis not present

## 2022-10-27 DIAGNOSIS — R000 Tachycardia, unspecified: Secondary | ICD-10-CM

## 2022-10-27 DIAGNOSIS — N486 Induration penis plastica: Secondary | ICD-10-CM | POA: Diagnosis present

## 2022-10-27 DIAGNOSIS — E785 Hyperlipidemia, unspecified: Secondary | ICD-10-CM

## 2022-10-27 DIAGNOSIS — E1121 Type 2 diabetes mellitus with diabetic nephropathy: Secondary | ICD-10-CM

## 2022-10-27 DIAGNOSIS — I10 Essential (primary) hypertension: Secondary | ICD-10-CM | POA: Diagnosis not present

## 2022-10-27 DIAGNOSIS — R93422 Abnormal radiologic findings on diagnostic imaging of left kidney: Secondary | ICD-10-CM

## 2022-10-27 DIAGNOSIS — Z87891 Personal history of nicotine dependence: Secondary | ICD-10-CM | POA: Insufficient documentation

## 2022-10-27 DIAGNOSIS — K219 Gastro-esophageal reflux disease without esophagitis: Secondary | ICD-10-CM | POA: Diagnosis not present

## 2022-10-27 DIAGNOSIS — Z01810 Encounter for preprocedural cardiovascular examination: Secondary | ICD-10-CM

## 2022-10-27 DIAGNOSIS — I129 Hypertensive chronic kidney disease with stage 1 through stage 4 chronic kidney disease, or unspecified chronic kidney disease: Secondary | ICD-10-CM

## 2022-10-27 DIAGNOSIS — E119 Type 2 diabetes mellitus without complications: Secondary | ICD-10-CM | POA: Insufficient documentation

## 2022-10-27 DIAGNOSIS — R9341 Abnormal radiologic findings on diagnostic imaging of renal pelvis, ureter, or bladder: Secondary | ICD-10-CM

## 2022-10-27 HISTORY — PX: CYSTOSCOPY W/ RETROGRADES: SHX1426

## 2022-10-27 HISTORY — DX: Long term (current) use of aspirin: Z79.82

## 2022-10-27 HISTORY — PX: URETEROSCOPY: SHX842

## 2022-10-27 LAB — GLUCOSE, CAPILLARY
Glucose-Capillary: 140 mg/dL — ABNORMAL HIGH (ref 70–99)
Glucose-Capillary: 117 mg/dL — ABNORMAL HIGH (ref 70–99)

## 2022-10-27 SURGERY — CYSTOSCOPY, WITH RETROGRADE PYELOGRAM
Anesthesia: General | Laterality: Left

## 2022-10-27 MED ORDER — SUGAMMADEX SODIUM 200 MG/2ML IV SOLN
INTRAVENOUS | Status: DC | PRN
  Administered 2022-10-27: 200 mg via INTRAVENOUS

## 2022-10-27 MED ORDER — GLYCOPYRROLATE 0.2 MG/ML IJ SOLN
INTRAMUSCULAR | Status: DC | PRN
  Administered 2022-10-27: .2 mg via INTRAVENOUS

## 2022-10-27 MED ORDER — IOHEXOL 180 MG/ML  SOLN
INTRAMUSCULAR | Status: DC | PRN
  Administered 2022-10-27: 20 mL

## 2022-10-27 MED ORDER — FENTANYL CITRATE (PF) 100 MCG/2ML IJ SOLN: 25.0000 ug | INTRAMUSCULAR | Status: DC | PRN

## 2022-10-27 MED ORDER — FENTANYL CITRATE (PF) 100 MCG/2ML IJ SOLN
INTRAMUSCULAR | Status: DC | PRN
  Administered 2022-10-27: 50 ug via INTRAVENOUS

## 2022-10-27 MED ORDER — ACETAMINOPHEN 10 MG/ML IV SOLN
INTRAVENOUS | Status: DC | PRN
  Administered 2022-10-27: 1000 mg via INTRAVENOUS

## 2022-10-27 MED ORDER — CEFAZOLIN SODIUM-DEXTROSE 2-4 GM/100ML-% IV SOLN
INTRAVENOUS | Status: AC
  Filled 2022-10-27: qty 100

## 2022-10-27 MED ORDER — MIDAZOLAM HCL 2 MG/2ML IJ SOLN
INTRAMUSCULAR | Status: DC | PRN
  Administered 2022-10-27: 2 mg via INTRAVENOUS

## 2022-10-27 MED ORDER — MIDAZOLAM HCL 2 MG/2ML IJ SOLN
INTRAMUSCULAR | Status: AC
  Filled 2022-10-27: qty 2

## 2022-10-27 MED ORDER — SODIUM CHLORIDE 0.9 % IR SOLN
Status: DC | PRN
  Administered 2022-10-27: 3000 mL

## 2022-10-27 MED ORDER — FENTANYL CITRATE (PF) 100 MCG/2ML IJ SOLN
INTRAMUSCULAR | Status: AC
  Filled 2022-10-27: qty 2

## 2022-10-27 MED ORDER — ROCURONIUM BROMIDE 100 MG/10ML IV SOLN
INTRAVENOUS | Status: DC | PRN
  Administered 2022-10-27: 40 mg via INTRAVENOUS

## 2022-10-27 MED ORDER — OXYBUTYNIN CHLORIDE 5 MG PO TABS: ORAL_TABLET | ORAL | 0 refills | Status: DC

## 2022-10-27 MED ORDER — SUCCINYLCHOLINE CHLORIDE 200 MG/10ML IV SOSY
PREFILLED_SYRINGE | INTRAVENOUS | Status: DC | PRN
  Administered 2022-10-27: 100 mg via INTRAVENOUS

## 2022-10-27 MED ORDER — ONDANSETRON HCL 4 MG/2ML IJ SOLN
INTRAMUSCULAR | Status: DC | PRN
  Administered 2022-10-27: 8 mg via INTRAVENOUS

## 2022-10-27 MED ORDER — PROPOFOL 10 MG/ML IV BOLUS
INTRAVENOUS | Status: DC | PRN
  Administered 2022-10-27: 200 mg via INTRAVENOUS

## 2022-10-27 MED ORDER — ONDANSETRON HCL 4 MG/2ML IJ SOLN: 4.0000 mg | Freq: Once | INTRAMUSCULAR | Status: DC | PRN

## 2022-10-27 MED ORDER — ACETAMINOPHEN 10 MG/ML IV SOLN: 1000.0000 mg | Freq: Once | INTRAVENOUS | Status: DC | PRN

## 2022-10-27 MED ORDER — LACTATED RINGERS IV SOLN: INTRAVENOUS | Status: AC

## 2022-10-27 MED ORDER — CHLORHEXIDINE GLUCONATE 0.12 % MT SOLN
OROMUCOSAL | Status: AC
  Filled 2022-10-27: qty 15

## 2022-10-27 MED ORDER — LIDOCAINE HCL (CARDIAC) PF 100 MG/5ML IV SOSY
PREFILLED_SYRINGE | INTRAVENOUS | Status: DC | PRN
  Administered 2022-10-27: 100 mg via INTRAVENOUS

## 2022-10-27 MED ORDER — PHENYLEPHRINE 80 MCG/ML (10ML) SYRINGE FOR IV PUSH (FOR BLOOD PRESSURE SUPPORT)
PREFILLED_SYRINGE | INTRAVENOUS | Status: DC | PRN
  Administered 2022-10-27: 80 ug via INTRAVENOUS

## 2022-10-27 SURGICAL SUPPLY — 29 items
BAG DRAIN SIEMENS DORNER NS (MISCELLANEOUS) ×2 IMPLANT
BAG DRN NS LF (MISCELLANEOUS) ×1
BAG PRESSURE INF REUSE 3000 (BAG) ×2 IMPLANT
BRUSH SCRUB EZ 1% IODOPHOR (MISCELLANEOUS) ×2 IMPLANT
BRUSH SCRUB EZ 4% CHG (MISCELLANEOUS) ×2 IMPLANT
CATH URET FLEX-TIP 2 LUMEN 10F (CATHETERS) ×2 IMPLANT
CATH URETL OPEN 5X70 (CATHETERS) ×2 IMPLANT
CATH URETL OPEN END 6X70 (CATHETERS) ×2 IMPLANT
CNTNR URN SCR LID CUP LEK RST (MISCELLANEOUS) ×2 IMPLANT
CONT SPEC 4OZ STRL OR WHT (MISCELLANEOUS) ×1
DRAPE UTILITY 15X26 TOWEL STRL (DRAPES) ×2 IMPLANT
DRSG TELFA 3X4 N-ADH STERILE (GAUZE/BANDAGES/DRESSINGS) ×2 IMPLANT
FORCEPS BIOP PIRANHA Y (CUTTING FORCEPS) IMPLANT
GLOVE BIOGEL PI IND STRL 7.5 (GLOVE) ×2 IMPLANT
GOWN STRL REUS W/ TWL LRG LVL3 (GOWN DISPOSABLE) ×4 IMPLANT
GOWN STRL REUS W/ TWL XL LVL3 (GOWN DISPOSABLE) ×2 IMPLANT
GOWN STRL REUS W/TWL LRG LVL3 (GOWN DISPOSABLE) ×2
GOWN STRL REUS W/TWL XL LVL3 (GOWN DISPOSABLE) ×1
GUIDEWIRE GREEN .038 145CM (MISCELLANEOUS) ×2 IMPLANT
GUIDEWIRE STR DUAL SENSOR (WIRE) ×2 IMPLANT
IV NS IRRIG 3000ML ARTHROMATIC (IV SOLUTION) ×2 IMPLANT
KIT TURNOVER CYSTO (KITS) ×2 IMPLANT
NDL SAFETY ECLIPSE 18X1.5 (NEEDLE) ×2 IMPLANT
PACK CYSTO AR (MISCELLANEOUS) ×2 IMPLANT
SET CYSTO W/LG BORE CLAMP LF (SET/KITS/TRAYS/PACK) ×2 IMPLANT
SHEATH NAVIGATOR HD 12/14X36 (SHEATH) ×2 IMPLANT
SURGILUBE 2OZ TUBE FLIPTOP (MISCELLANEOUS) ×2 IMPLANT
VALVE UROSEAL ADJ ENDO (VALVE) IMPLANT
WATER STERILE IRR 500ML POUR (IV SOLUTION) ×2 IMPLANT

## 2022-10-27 NOTE — Anesthesia Postprocedure Evaluation (Signed)
Anesthesia Post Note  Patient: Arthur [name]  Procedure(s) Performed: CYSTOSCOPY WITH RETROGRADE PYELOGRAM (Left) DIAGNOSTIC URETEROSCOPY (Left)  Patient location during evaluation: PACU Anesthesia Type: General Level of consciousness: awake and alert, oriented and patient cooperative Pain management: pain level controlled Vital Signs Assessment: post-procedure vital signs reviewed and stable Respiratory status: spontaneous breathing, nonlabored ventilation and respiratory function stable Cardiovascular status: blood pressure returned to baseline and stable Postop Assessment: adequate PO intake Anesthetic complications: no   No notable events documented.   Last Vitals:  Vitals:   10/27/22 1115 10/27/22 1127  BP: (!) 143/94   Pulse: 86 88  Resp: 12 17  Temp: 36.8 C 36.6 C  SpO2: 100% 100%    Last Pain:  Vitals:   10/27/22 1127  TempSrc: Temporal  PainSc: 0-No pain                 Reed Breech

## 2022-10-27 NOTE — Anesthesia Procedure Notes (Signed)
Procedure Name: Intubation Date/Time: 10/27/2022 10:04 AM  Performed by: Mohammed Kindle, CRNAPre-anesthesia Checklist: Patient identified, Emergency Drugs available, Suction available and Patient being monitored Patient Re-evaluated:Patient Re-evaluated prior to induction Oxygen Delivery Method: Circle system utilized Preoxygenation: Pre-oxygenation with 100% oxygen Induction Type: IV induction Ventilation: Mask ventilation without difficulty Laryngoscope Size: McGraph and 3 Grade View: Grade I Tube type: Oral Number of attempts: 1 Airway Equipment and Method: Stylet Placement Confirmation: ETT inserted through vocal cords under direct vision, positive ETCO2, breath sounds checked- equal and bilateral and CO2 detector Secured at: 21 cm Tube secured with: Tape Dental Injury: Teeth and Oropharynx as per pre-operative assessment

## 2022-10-27 NOTE — Interval H&P Note (Signed)
History and Physical Interval Note:  10/27/2022 9:55 AM  Arthur [name]  has presented today for surgery, with the diagnosis of Filling Defect of Left Upper Pole.  The various methods of treatment have been discussed with the patient and family. After consideration of risks, benefits and other options for treatment, the patient has consented to  Procedure(s): CYSTOSCOPY WITH RETROGRADE PYELOGRAM (Left) DIAGNOSTIC URETEROSCOPY (Left) URETERAL BIOPSY (Left) as a surgical intervention.  The patient's history has been reviewed, patient examined, no change in status, stable for surgery.  I have reviewed the patient's chart and labs.  Questions were answered to the patient's satisfaction.    CV:RRR Lungs:clear  Riki Altes

## 2022-10-27 NOTE — Discharge Instructions (Addendum)
DISCHARGE INSTRUCTIONS FOR URETEROSCOPY   MEDICATIONS:  1. Resume all your other meds from home.  2.  AZO (over-the-counter) can help with the burning/stinging when you urinate. 3.  Oxybutynin will help with urinary frequency, urgency, bladder spasm, Rx was sent to your pharmacy.  ACTIVITY:  1. May resume regular activities in 24 hours. 2. Drink plenty of water  3. May return to work tomorrow or when you feel ready    SIGNS/SYMPTOMS TO CALL:  Common postoperative symptoms include urinary frequency, urgency, bladder spasm and blood in the urine  Please call us if you have a fever greater than 101.5, uncontrolled nausea/vomiting, uncontrolled pain, dizziness, unable to urinate, excessively bloody urine, chest pain, shortness of breath, leg swelling, leg pain, or any other concerns or questions.   You can reach [place] at [phone].   FOLLOW-UP:  1. You will be scheduled for a follow-up appointment in approximately 1 month.  Office will contact you for scheduling    AMBULATORY SURGERY  DISCHARGE INSTRUCTIONS   The drugs that you were given will stay in your system until tomorrow so for the next 24 hours you should not:  Drive an automobile Make any legal decisions Drink any alcoholic beverage   You may resume regular meals tomorrow.  Today it is better to start with liquids and gradually work up to solid foods.  You may eat anything you prefer, but it is better to start with liquids, then soup and crackers, and gradually work up to solid foods.   Please notify your doctor immediately if you have any unusual bleeding, trouble breathing, redness and pain at the surgery site, drainage, fever, or pain not relieved by medication.   Additional Instructions:

## 2022-10-27 NOTE — Transfer of Care (Signed)
Immediate Anesthesia Transfer of Care Note  Patient: Arthur [name]  Procedure(s) Performed: CYSTOSCOPY WITH RETROGRADE PYELOGRAM (Left) DIAGNOSTIC URETEROSCOPY (Left)  Patient Location: PACU  Anesthesia Type:General  Level of Consciousness: awake, drowsy, and patient cooperative  Airway & Oxygen Therapy: Patient Spontanous Breathing and Patient connected to face mask oxygen  Post-op Assessment: Report given to RN and Post -op Vital signs reviewed and stable  Post vital signs: Reviewed and stable  Last Vitals:  Vitals Value Taken Time  BP 156/98 10/27/22 1046  Temp 36.6 C 10/27/22 1045  Pulse 82 10/27/22 1048  Resp 12 10/27/22 1048  SpO2 100 % 10/27/22 1048  Vitals shown include unfiled device data.  Last Pain:  Vitals:   10/27/22 1045  TempSrc:   PainSc: Asleep         Complications: No notable events documented.

## 2022-10-27 NOTE — Anesthesia Preprocedure Evaluation (Addendum)
Anesthesia Evaluation  Patient identified by MRN, date of birth, ID band Patient awake    Reviewed: Allergy & Precautions, NPO status , Patient's Chart, lab work & pertinent test results  History of Anesthesia Complications Negative for: history of anesthetic complications  Airway Mallampati: II   Neck ROM: Full    Dental no notable dental hx.    Pulmonary former smoker (quit 1988)   Pulmonary exam normal breath sounds clear to auscultation       Cardiovascular hypertension, Normal cardiovascular exam Rhythm:Regular Rate:Normal  ECG 10/23/22: normal   Neuro/Psych negative neurological ROS     GI/Hepatic ,GERD  ,,  Endo/Other  diabetes, Type 2    Renal/GU Renal disease (CKD)   BPH    Musculoskeletal  (+) Arthritis ,    Abdominal   Peds  Hematology negative hematology ROS (+)   Anesthesia Other Findings Last dose of Mounjaro 10/19/22  Reproductive/Obstetrics                             Anesthesia Physical Anesthesia Plan  ASA: 2  Anesthesia Plan: General   Post-op Pain Management:    Induction: Intravenous  PONV Risk Score and Plan: 2 and Ondansetron, Dexamethasone and Treatment may vary due to age or medical condition  Airway Management Planned: Oral ETT  Additional Equipment:   Intra-op Plan:   Post-operative Plan: Extubation in OR  Informed Consent: I have reviewed the patients History and Physical, chart, labs and discussed the procedure including the risks, benefits and alternatives for the proposed anesthesia with the patient or authorized representative who has indicated his/her understanding and acceptance.     Dental advisory given  Plan Discussed with: CRNA  Anesthesia Plan Comments: (Patient consented for risks of anesthesia including but not limited to:  - adverse reactions to medications - damage to eyes, teeth, lips or other oral mucosa - nerve damage  due to positioning  - sore throat or hoarseness - damage to heart, brain, nerves, lungs, other parts of body or loss of life  Informed patient about role of CRNA in peri- and intra-operative care.  Patient voiced understanding.)        Anesthesia Quick Evaluation

## 2022-10-27 NOTE — Op Note (Signed)
   Preoperative diagnosis:  Possible filling defect left upper pole calyx  Postoperative diagnosis:  No evidence of filling defect  Procedure: Cystoscopy Left retrograde pyelogram with interpretation Left ureteropyeloscopy-diagnostic  Surgeon: Riki Altes, MD  Anesthesia: General  Complications: None  Intraoperative findings:  Cystoscopy: Urethra normal in caliber without stricture; mild-moderate lateral lobe enlargement prostate.  Bladder grossly normal in appearance without erythema, solid or papillary lesions.  UOs normal-appearing bilaterally. Ureteropyeloscopy: Ureteral mucosa normal in appearance without narrowing, calculi or tumor.  All calyces examined closely under direct vision and no mucosal abnormalities noted Left retrograde pyelogram: Ureter normal in appearance without narrowing, dilation or filling defect.  Renal pelvis and calyces without filling defects   EBL: Minimal  Specimens: Saline barbotage left upper pole calyx for cytology  Indication: Arthur [name] is a 57 y.o. incidentally noted on CT to have a possible filling defect in a left upper pole calyx.  After reviewing the management options for treatment, he elected to proceed with the above surgical procedure(s). We have discussed the potential benefits and risks of the procedure, side effects of the proposed treatment, the likelihood of the patient achieving the goals of the procedure, and any potential problems that might occur during the procedure or recuperation. Informed consent has been obtained.  Description of procedure:  The patient was taken to the operating room and general anesthesia was induced.  The patient was placed in the dorsal lithotomy position, prepped and draped in the usual sterile fashion, and preoperative antibiotics were administered. A preoperative time-out was performed.   A 21 French cystoscope with 30 degree lens was lubricated, passed per urethra and advanced into the  bladder under direct vision with findings as described above.    A 0.038 Sensor wire was then placed through the cystoscope and into the left ureter and advanced to the renal pelvis under fluoroscopic guidance without difficulty.  The cystoscope was removed and a dual-lumen catheter was advanced over the guidewire to the region of the lower proximal ureter.  Retrograde pyelogram was then performed with findings as described above.  A second Sensor wire was placed through the dual-lumen catheter and the catheter was removed.  A single channel digital flexible ureteroscope was placed over the working wire and advanced into the ureter without difficulty.  Once in the lower proximal ureter the guidewire was removed and the ureteroscope was easily advanced into the renal pelvis.  The medial upper pole calyx was closely examined and no filling defect was identified.  Saline barbotage was performed of this area and sent for cytology.  All calyces were then examined and no abnormalities were noted.  No abnormalities were noted in the renal pelvis.  Repeat retrograde pyelogram was performed and all calyces were examined under fluoroscopic guidance and again no abnormalities were identified.  The ureteroscope was withdrawn under direct vision.  A ureteral stent was not placed.  After anesthetic reversal he was transported to the PACU in stable condition.  Plan: He will be notified with the urine cytology results Postop follow-up approximately 1 month   Riki Altes, M.D.

## 2022-10-28 ENCOUNTER — Encounter: Payer: Self-pay | Admitting: Urology

## 2022-10-28 LAB — CYTOLOGY - NON PAP

## 2022-10-30 ENCOUNTER — Telehealth: Payer: Self-pay | Admitting: Family Medicine

## 2022-10-30 NOTE — Telephone Encounter (Signed)
error 

## 2022-11-27 ENCOUNTER — Encounter: Payer: Self-pay | Admitting: Urology

## 2022-11-27 ENCOUNTER — Ambulatory Visit: Payer: 59 | Admitting: Urology

## 2022-12-08 ENCOUNTER — Other Ambulatory Visit: Payer: Self-pay | Admitting: Family Medicine

## 2022-12-10 NOTE — Telephone Encounter (Signed)
Requested medication (s) are due for refill today: Yes  Requested medication (s) are on the active medication list: Yes  Last refill:  03/11/21  Future visit scheduled: No  Notes to clinic:  Unable to refill due to no refill protocol for this medication.      Requested Prescriptions  Pending Prescriptions Disp Refills   ipratropium (ATROVENT) 0.06 % nasal spray [Pharmacy Med Name: IPRATROPIUM 0.06% NAS SP (165)] 15 mL 12    Sig: USE 2 SPRAYS IN EACH NOSTRIL FOUR TIMES DAILY     Off-Protocol Failed - 12/08/2022  8:14 PM      Failed - Medication not assigned to a protocol, review manually.      Passed - Valid encounter within last 12 months    Recent Outpatient Visits           2 months ago Type 2 diabetes mellitus with diabetic nephropathy, with long-term current use of insulin (HCC)   [place] [facility] [place], Megan P, DO   3 months ago Type 2 diabetes mellitus with diabetic nephropathy, with long-term current use of insulin (HCC)   [place] [facility], Megan P, DO   4 months ago Foreign body, hand, superficial, right, initial encounter   [place] [facility] [place], Erin E, PA-C   7 months ago Type 2 diabetes mellitus with diabetic nephropathy, with long-term current use of insulin (HCC)   [place] [facility] [place], Megan P, DO   8 months ago Type 2 diabetes mellitus with diabetic nephropathy, with long-term current use of insulin [facility])   [place] [facility] [place], [place] P, DO             Off-Protocol Failed - 12/08/2022  8:14 PM      Failed - Medication not assigned to a protocol, review manually.      Passed - Valid encounter within last 12 months    Recent Outpatient Visits           2 months ago Type 2 diabetes mellitus with diabetic nephropathy, with long-term current use of insulin (HCC)   [place] [facility] [place], Megan P, DO   3 months ago  Type 2 diabetes mellitus with diabetic nephropathy, with long-term current use of insulin (HCC)   [place] [facility], Megan P, DO   4 months ago Foreign body, hand, superficial, right, initial encounter   [place] [facility] [place], Erin E, PA-C   7 months ago Type 2 diabetes mellitus with diabetic nephropathy, with long-term current use of insulin (HCC)   [place] [facility] [place], Megan P, DO   8 months ago Type 2 diabetes mellitus with diabetic nephropathy, with long-term current use of insulin (HCC)   [place] [facility], Megan P, DO              Refused Prescriptions Disp Refills   omeprazole (PRILOSEC) 20 MG capsule [Pharmacy Med Name: OMEPRAZOLE 20MG  CAPSULES] 90 capsule 0    Sig: TAKE 1 CAPSULE(20 MG) BY MOUTH DAILY     Gastroenterology: Proton Pump Inhibitors Passed - 12/08/2022  8:14 PM      Passed - Valid encounter within last 12 months    Recent Outpatient Visits           2 months ago Type 2 diabetes mellitus with diabetic nephropathy, with long-term current use of insulin (HCC)   [place]  [facility] [place], Megan P, DO   3 months ago Type 2 diabetes mellitus with diabetic nephropathy, with long-term current use of insulin (HCC)   [place] [facility] [place], Megan P, DO   4 months ago Foreign body, hand, superficial, right, initial encounter   [place] [facility] [place], Erin E, PA-C   7 months ago Type 2 diabetes mellitus with diabetic nephropathy, with long-term current use of insulin (HCC)   [place] [facility] [place], Megan P, DO   8 months ago Type 2 diabetes mellitus with diabetic nephropathy, with long-term current use of insulin [facility] [place])   [place] [facility] [place], [place], [place]

## 2022-12-10 NOTE — Telephone Encounter (Signed)
Refusing due to dosage changed to 40 mg.  Requested Prescriptions  Pending Prescriptions Disp Refills   ipratropium (ATROVENT) 0.06 % nasal spray [Pharmacy Med Name: IPRATROPIUM 0.06% NAS SP (165)] 15 mL 12    Sig: USE 2 SPRAYS IN EACH NOSTRIL FOUR TIMES DAILY     Off-Protocol Failed - 12/08/2022  8:14 PM      Failed - Medication not assigned to a protocol, review manually.      Passed - Valid encounter within last 12 months    Recent Outpatient Visits           2 months ago Type 2 diabetes mellitus with diabetic nephropathy, with long-term current use of insulin (HCC)   [place] [facility] [place], Megan P, DO   3 months ago Type 2 diabetes mellitus with diabetic nephropathy, with long-term current use of insulin (HCC)   [place] [facility], Megan P, DO   4 months ago Foreign body, hand, superficial, right, initial encounter   [place] [facility] Mecum, Erin E, PA-C   7 months ago Type 2 diabetes mellitus with diabetic nephropathy, with long-term current use of insulin (HCC)   [place] [facility] [place], Megan P, DO   8 months ago Type 2 diabetes mellitus with diabetic nephropathy, with long-term current use of insulin [facility])   [place] [facility] [place], [place] P, DO             Off-Protocol Failed - 12/08/2022  8:14 PM      Failed - Medication not assigned to a protocol, review manually.      Passed - Valid encounter within last 12 months    Recent Outpatient Visits           2 months ago Type 2 diabetes mellitus with diabetic nephropathy, with long-term current use of insulin (HCC)   [place] [facility] [place], Megan P, DO   3 months ago Type 2 diabetes mellitus with diabetic nephropathy, with long-term current use of insulin (HCC)   [place] [facility], Megan P, DO   4 months ago Foreign body, hand, superficial, right, initial  encounter   [place] [facility] Mecum, Erin E, PA-C   7 months ago Type 2 diabetes mellitus with diabetic nephropathy, with long-term current use of insulin (HCC)   [place] [facility] [place], Megan P, DO   8 months ago Type 2 diabetes mellitus with diabetic nephropathy, with long-term current use of insulin (HCC)   [place] [facility] [place], Megan P, DO              Refused Prescriptions Disp Refills   omeprazole (PRILOSEC) 20 MG capsule [Pharmacy Med Name: OMEPRAZOLE 20MG  CAPSULES] 90 capsule 0    Sig: TAKE 1 CAPSULE(20 MG) BY MOUTH DAILY     Gastroenterology: Proton Pump Inhibitors Passed - 12/08/2022  8:14 PM      Passed - Valid encounter within last 12 months    Recent Outpatient Visits           2 months ago Type 2 diabetes mellitus with diabetic nephropathy, with long-term current use of insulin (HCC)   [place] [facility] [place], Megan P, DO   3 months ago Type 2 diabetes mellitus with diabetic nephropathy, with long-term current use of insulin (HCC)    [facility] [place], Megan P, DO   4 months ago  Foreign body, hand, superficial, right, initial encounter   [place] [facility] Mecum, Erin E, PA-C   7 months ago Type 2 diabetes mellitus with diabetic nephropathy, with long-term current use of insulin (HCC)   [place] [facility] [place], Megan P, DO   8 months ago Type 2 diabetes mellitus with diabetic nephropathy, with long-term current use of insulin [facility])   [place] [place], [place], [place]

## 2023-01-08 ENCOUNTER — Other Ambulatory Visit: Payer: Self-pay | Admitting: Family Medicine

## 2023-01-08 ENCOUNTER — Ambulatory Visit (INDEPENDENT_AMBULATORY_CARE_PROVIDER_SITE_OTHER): Payer: 59 | Admitting: Physician Assistant

## 2023-01-08 VITALS — BP 142/85 | HR 82 | Ht 67.5 in | Wt 136.2 lb

## 2023-01-08 DIAGNOSIS — N486 Induration penis plastica: Secondary | ICD-10-CM | POA: Diagnosis not present

## 2023-01-08 DIAGNOSIS — R300 Dysuria: Secondary | ICD-10-CM

## 2023-01-08 LAB — URINALYSIS, COMPLETE
Bilirubin, UA: NEGATIVE
Glucose, UA: NEGATIVE
Ketones, UA: NEGATIVE
Leukocytes,UA: NEGATIVE
Nitrite, UA: NEGATIVE
RBC, UA: NEGATIVE
Specific Gravity, UA: 1.02 (ref 1.005–1.030)
Urobilinogen, Ur: 0.2 mg/dL (ref 0.2–1.0)
pH, UA: 5.5 (ref 5.0–7.5)

## 2023-01-08 LAB — MICROSCOPIC EXAMINATION: Bacteria, UA: NONE SEEN

## 2023-01-08 LAB — BLADDER SCAN AMB NON-IMAGING: Scan Result: 14

## 2023-01-08 MED ORDER — TAMSULOSIN HCL 0.4 MG PO CAPS: 0.4000 mg | ORAL_CAPSULE | Freq: Every day | ORAL | 11 refills | Status: AC

## 2023-01-08 NOTE — Progress Notes (Signed)
 01/08/2023 12:02 PM   JAEVEN WANZER [birth year] [id number]  CC: Chief Complaint  Patient presents with   Dysuria   HPI: Arthur [name] is a 59 y.o. male with PMH ED, Peyronie's disease, and BPH who presents today for evaluation of dysuria.   Today he reports occasional dysuria with initiation that is localized to the tip of the penis.  He notes that it tends to be worse with his first a.m. void.  He also has some occasional weak stream and is not on pharmacotherapy for BPH.  He would also like to pursue treatment for his Peyronie's disease.  He notes a nearly 90 degree curvature toward the left with some curvature while flaccid.  His erections can be uncomfortable and are starting to interfere with his ability to penetrate his partner.  In-office UA today positive for 1+ protein; urine microscopy pan negative.  PVR 14 mL.  PMH: Past Medical History:  Diagnosis Date   Arthritis    Diabetes mellitus with renal complications (HCC)    Erectile dysfunction associated with type 2 diabetes mellitus (HCC)    Exotropia    History of tobacco abuse    Hyperlipidemia    Hypertension    Long term current use of aspirin     Thoracic back pain     Surgical History: Past Surgical History:  Procedure Laterality Date   COLONOSCOPY WITH PROPOFOL  N/A 01/25/2019   Procedure: COLONOSCOPY WITH PROPOFOL ;  Surgeon: Unk Corinn Skiff, MD;  Location: [facility] ENDOSCOPY;  Service: Gastroenterology;  Laterality: N/A;  Positive on 12/22/2018   CYSTOSCOPY W/ RETROGRADES Left 10/27/2022   Procedure: CYSTOSCOPY WITH RETROGRADE PYELOGRAM;  Surgeon: Twylla Glendia BROCKS, MD;  Location: [facility] ORS;  Service: Urology;  Laterality: Left;   ESOPHAGOGASTRODUODENOSCOPY (EGD) WITH PROPOFOL  N/A 07/14/2022   Procedure: ESOPHAGOGASTRODUODENOSCOPY (EGD) WITH PROPOFOL ;  Surgeon: Therisa Bi, MD;  Location: [facility] ENDOSCOPY;  Service: Gastroenterology;  Laterality: N/A;   HIP SURGERY Right    SHOULDER SURGERY Left    bone  spurs removed   URETEROSCOPY Left 10/27/2022   Procedure: DIAGNOSTIC URETEROSCOPY;  Surgeon: Twylla Glendia BROCKS, MD;  Location: [facility] ORS;  Service: Urology;  Laterality: Left;    Home Medications:  Allergies as of 01/08/2023       Reactions   Codeine Nausea Only   Hydrocodone Nausea Only   Oxycodone  Nausea Only        Medication List        Accurate as of January 08, 2023 12:02 PM. If you have any questions, ask your nurse or doctor.          STOP taking these medications    omeprazole  40 MG capsule Commonly known as: PRILOSEC   oxybutynin  5 MG tablet Commonly known as: DITROPAN        TAKE these medications    aluminum-magnesium hydroxide 200-200 MG/5ML suspension Take 15 mLs by mouth every 6 (six) hours as needed for indigestion.   Aspirin  Low Dose 81 MG tablet Generic drug: aspirin  EC TAKE 1 TABLET(81 MG) BY MOUTH DAILY   atorvastatin  20 MG tablet Commonly known as: LIPITOR  Take 1 tablet (20 mg total) by mouth daily.   glucose blood test strip 1 each by Other route as needed for other. One touch ultra test strips. Use as instructed.   ipratropium 0.06 % nasal spray Commonly known as: ATROVENT  USE 2 SPRAYS IN EACH NOSTRIL FOUR TIMES DAILY   losartan  25 MG tablet Commonly known as: COZAAR  TAKE 1/2 TABLET(12.5 MG) BY MOUTH DAILY  metFORMIN  1000 MG tablet Commonly known as: GLUCOPHAGE  TAKE 1 TABLET(1000 MG) BY MOUTH TWICE DAILY   tirzepatide  12.5 MG/0.5ML Pen Commonly known as: MOUNJARO  Inject 12.5 mg into the skin once a week.        Allergies:  Allergies  Allergen Reactions   Codeine Nausea Only   Hydrocodone Nausea Only   Oxycodone  Nausea Only    Family History: Family History  Problem Relation Age of Onset   Diabetes Father    Lung disease Mother    Diabetes Sister    Diabetes Maternal Aunt     Social History:   reports that he quit smoking about 37 years ago. His smoking use included cigarettes. He started smoking about 57  years ago. He has a 20 pack-year smoking history. He has never used smokeless tobacco. He reports that he does not drink alcohol and does not use drugs.  Physical Exam: BP (!) 142/85   Pulse 82   Ht 5' 7.5 (1.715 m)   Wt 136 lb 4 oz (61.8 kg)   BMI 21.02 kg/m   Constitutional:  Alert and oriented, no acute distress, nontoxic appearing HEENT: [place], AT Cardiovascular: No clubbing, cyanosis, or edema Respiratory: Normal respiratory effort, no increased work of breathing GU: Patent urethral meatus, no rashes or lesions.  No penile discharge.  Slight leftward curvature of the penis with a palpable lateral plaque. Skin: No rashes, bruises or suspicious lesions Neurologic: Grossly intact, no focal deficits, moving all 4 extremities Psychiatric: Normal mood and affect  Laboratory Data: Results for orders placed or performed in visit on 01/08/23  Microscopic Examination   Collection Time: 01/08/23 11:32 AM   Urine  Result Value Ref Range   WBC, UA 0-5 0 - 5 /hpf   RBC, Urine 0-2 0 - 2 /hpf   Epithelial Cells (non renal) 0-10 0 - 10 /hpf   Bacteria, UA None seen None seen/Few  Urinalysis, Complete   Collection Time: 01/08/23 11:32 AM  Result Value Ref Range   Specific Gravity, UA 1.020 1.005 - 1.030   pH, UA 5.5 5.0 - 7.5   Color, UA Yellow Yellow   Appearance Ur Clear Clear   Leukocytes,UA Negative Negative   Protein,UA 1+ (A) Negative/Trace   Glucose, UA Negative Negative   Ketones, UA Negative Negative   RBC, UA Negative Negative   Bilirubin, UA Negative Negative   Urobilinogen, Ur 0.2 0.2 - 1.0 mg/dL   Nitrite, UA Negative Negative   Microscopic Examination See below:   Bladder Scan (Post Void Residual) in office   Collection Time: 01/08/23 11:39 AM  Result Value Ref Range   Scan Result 14 ml    Assessment & Plan:   1. Dysuria (Primary) UA is bland, suspect secondary to BPH.  Will start Flomax .  We discussed common side effects including retrograde ejaculation and  orthostasis. - Urinalysis, Complete - Bladder Scan (Post Void Residual) in office - tamsulosin  (FLOMAX ) 0.4 MG CAPS capsule; Take 1 capsule (0.4 mg total) by mouth daily.  Dispense: 30 capsule; Refill: 11  2. Peyronie disease He is interested in pursuing Xiaflex  and per patient report it sounds like he has sufficient curvature to warrant this.  I will discuss with Dr. Twylla if he requires a modeling appointment to confirm degree of curvature prior to initiating therapy and reach out to the patient when I learn more.  Return for I will follow-up regarding Xiaflex .  Lucie Hones, PA-C  [facility] [place] [address] [place], [place] [address] [phone]

## 2023-01-12 NOTE — Telephone Encounter (Signed)
 Requested Prescriptions  Pending Prescriptions Disp Refills   losartan  (COZAAR ) 25 MG tablet [Pharmacy Med Name: LOSARTAN  25MG  TABLETS] 45 tablet 0    Sig: TAKE 1/2 TABLET(12.5 MG) BY MOUTH DAILY     Cardiovascular:  Angiotensin Receptor Blockers Failed - 01/12/2023  9:18 AM      Failed - Last BP in normal range    BP Readings from Last 1 Encounters:  01/08/23 (!) 142/85         Passed - Cr in normal range and within 180 days    Creatinine, Ser  Date Value Ref Range Status  09/11/2022 1.18 0.61 - 1.24 mg/dL Final         Passed - K in normal range and within 180 days    Potassium  Date Value Ref Range Status  09/11/2022 4.2 3.5 - 5.1 mmol/L Final         Passed - Patient is not pregnant      Passed - Valid encounter within last 6 months    Recent Outpatient Visits           3 months ago Type 2 diabetes mellitus with diabetic nephropathy, with long-term current use of insulin  (HCC)   [place] [facility] [place], Megan P, DO   5 months ago Type 2 diabetes mellitus with diabetic nephropathy, with long-term current use of insulin  (HCC)   [place] [facility] [place], Megan P, DO   5 months ago Foreign body, hand, superficial, right, initial encounter   [place] [facility] Mecum, Erin E, PA-C   8 months ago Type 2 diabetes mellitus with diabetic nephropathy, with long-term current use of insulin  (HCC)   [place] [facility] [place], Megan P, DO   9 months ago Type 2 diabetes mellitus with diabetic nephropathy, with long-term current use of insulin  (HCC)   [place] [facility] [place], Megan P, DO               atorvastatin  (LIPITOR ) 20 MG tablet [Pharmacy Med Name: ATORVASTATIN  20MG  TABLETS] 90 tablet 1    Sig: TAKE 1 TABLET(20 MG) BY MOUTH DAILY     Cardiovascular:  Antilipid - Statins Failed - 01/12/2023  9:18 AM      Failed - Lipid Panel in normal range within the last 12 months     Cholesterol, Total  Date Value Ref Range Status  05/12/2022 141 100 - 199 mg/dL Final   LDL Chol Calc (NIH)  Date Value Ref Range Status  05/12/2022 69 0 - 99 mg/dL Final   HDL  Date Value Ref Range Status  05/12/2022 54 >39 mg/dL Final   Triglycerides  Date Value Ref Range Status  05/12/2022 99 0 - 149 mg/dL Final         Passed - Patient is not pregnant      Passed - Valid encounter within last 12 months    Recent Outpatient Visits           3 months ago Type 2 diabetes mellitus with diabetic nephropathy, with long-term current use of insulin  (HCC)   [place] [facility] [facility] [place], Megan P, DO   5 months ago Type 2 diabetes mellitus with diabetic nephropathy, with long-term current use of insulin  (HCC)   [place] [facility] [place], Megan P, DO   5 months ago Foreign body, hand, superficial, right, initial encounter   [place] [facility] Mecum, Rocky BRAVO, PA-C  8 months ago Type 2 diabetes mellitus with diabetic nephropathy, with long-term current use of insulin  (HCC)   [place] [facility] [place], Megan P, DO   9 months ago Type 2 diabetes mellitus with diabetic nephropathy, with long-term current use of insulin  [facility])   [place] [facility] [place], [place], DO

## 2023-02-22 ENCOUNTER — Other Ambulatory Visit: Payer: Self-pay | Admitting: Family Medicine

## 2023-02-23 NOTE — Telephone Encounter (Signed)
 Requested medication (s) are due for refill today: routing for review  Requested medication (s) are on the active medication list: yes  Last refill:  unknown  Future visit scheduled: yes  Notes to clinic:  Unable to refill per protocol, Rx expired. Medication not on current list, possible new RX.     Requested Prescriptions  Pending Prescriptions Disp Refills   Continuous Glucose Sensor (FREESTYLE LIBRE 14 DAY SENSOR) MISC [Pharmacy Med Name: FREESTYLE LIBRE SENSOR 14D KIT] 2 each 0    Sig: USE AS DIRECTED TO  CHECK  BLOOD  GLUCOSE     Endocrinology: Diabetes - Testing Supplies Passed - 02/23/2023  2:25 PM      Passed - Valid encounter within last 12 months    Recent Outpatient Visits           4 months ago Type 2 diabetes mellitus with diabetic nephropathy, with long-term current use of insulin (HCC)   [place] [facility] [place], Megan P, DO   6 months ago Type 2 diabetes mellitus with diabetic nephropathy, with long-term current use of insulin (HCC)   [place] [facility], [facility] P, DO   6 months ago Foreign body, hand, superficial, right, initial encounter   [place] [facility] Mecum, Erin E, PA-C   9 months ago Type 2 diabetes mellitus with diabetic nephropathy, with long-term current use of insulin (HCC)   [place] [facility] [place], Megan P, DO   10 months ago Type 2 diabetes mellitus with diabetic nephropathy, with long-term current use of insulin [facility])   [place] [facility] [place], [place], DO

## 2023-03-22 ENCOUNTER — Other Ambulatory Visit: Payer: Self-pay | Admitting: Family Medicine

## 2023-03-23 NOTE — Telephone Encounter (Signed)
 Requested medications are due for refill today.  yes  Requested medications are on the active medications list.  yes  Last refill. 08/13/2022 6mL 1 rf  Future visit scheduled.   no  Notes to clinic.  Medication not assigned to a protocol. Please review for refill.    Requested Prescriptions  Pending Prescriptions Disp Refills   MOUNJARO 12.5 MG/0.5ML Pen [Pharmacy Med Name: Mounjaro 12.5 MG/0.5ML Subcutaneous Solution Pen-injector] 20 mL 0    Sig: INJECT 12.5 MG  SUBCUTANEOUSLY ONCE A WEEK     Off-Protocol Failed - 03/23/2023  5:00 PM      Failed - Medication not assigned to a protocol, review manually.      Passed - Valid encounter within last 12 months    Recent Outpatient Visits           5 months ago Type 2 diabetes mellitus with diabetic nephropathy, with long-term current use of insulin (HCC)   [place] [facility] [place], Megan P, DO   7 months ago Type 2 diabetes mellitus with diabetic nephropathy, with long-term current use of insulin (HCC)   [place] [facility], Megan P, DO   7 months ago Foreign body, hand, superficial, right, initial encounter   [place] [facility] Mecum, Erin E, PA-C   10 months ago Type 2 diabetes mellitus with diabetic nephropathy, with long-term current use of insulin (HCC)   [facility] [place], Megan P, DO   11 months ago Type 2 diabetes mellitus with diabetic nephropathy, with long-term current use of insulin (HCC)   [employer] [facility] [facility] [place], Megan P, DO              Refused Prescriptions Disp Refills   Continuous Glucose Sensor (FREESTYLE LIBRE 14 DAY SENSOR) MISC [Pharmacy Med Name: FREESTYLE LIBRE SENSOR 14D KIT] 2 each 0    Sig: USE AS DIRECTED TO  CHECK  BLOOD  GLUCOSE     Endocrinology: Diabetes - Testing Supplies Passed - 03/23/2023  5:00 PM      Passed - Valid encounter within last 12 months    Recent Outpatient Visits            5 months ago Type 2 diabetes mellitus with diabetic nephropathy, with long-term current use of insulin (HCC)   [place] [facility] [place], Megan P, DO   7 months ago Type 2 diabetes mellitus with diabetic nephropathy, with long-term current use of insulin (HCC)   [place] [facility] [place], Megan P, DO   7 months ago Foreign body, hand, superficial, right, initial encounter   [place] [facility] Mecum, Erin E, PA-C   10 months ago Type 2 diabetes mellitus with diabetic nephropathy, with long-term current use of insulin (HCC)   [place] [facility] [place], Megan P, DO   11 months ago Type 2 diabetes mellitus with diabetic nephropathy, with long-term current use of insulin [facility])    [facility] [place], [place], DO

## 2023-03-23 NOTE — Telephone Encounter (Signed)
 Requested Prescriptions  Pending Prescriptions Disp Refills   Continuous Glucose Sensor (FREESTYLE LIBRE 14 DAY SENSOR) MISC [Pharmacy Med Name: FREESTYLE LIBRE 14DAY SENSOR] 6 each 1    Sig: USE AS DIRECTED EVERY 14 DAYS     Endocrinology: Diabetes - Testing Supplies Passed - 03/23/2023  4:57 PM      Passed - Valid encounter within last 12 months    Recent Outpatient Visits           5 months ago Type 2 diabetes mellitus with diabetic nephropathy, with long-term current use of insulin (HCC)   [place] [facility] [place], Megan P, DO   7 months ago Type 2 diabetes mellitus with diabetic nephropathy, with long-term current use of insulin (HCC)   [place] [facility], Megan P, DO   7 months ago Foreign body, hand, superficial, right, initial encounter   [place] [facility] Mecum, Erin E, PA-C   10 months ago Type 2 diabetes mellitus with diabetic nephropathy, with long-term current use of insulin (HCC)   [place] [facility], Megan P, DO   11 months ago Type 2 diabetes mellitus with diabetic nephropathy, with long-term current use of insulin [facility])    [facility] [place], [place], DO

## 2023-03-23 NOTE — Telephone Encounter (Signed)
 Requested Prescriptions  Pending Prescriptions Disp Refills   MOUNJARO 12.5 MG/0.5ML Pen [Pharmacy Med Name: Mounjaro 12.5 MG/0.5ML Subcutaneous Solution Pen-injector] 20 mL 0    Sig: INJECT 12.5 MG  SUBCUTANEOUSLY ONCE A WEEK     Off-Protocol Failed - 03/23/2023  4:59 PM      Failed - Medication not assigned to a protocol, review manually.      Passed - Valid encounter within last 12 months    Recent Outpatient Visits           5 months ago Type 2 diabetes mellitus with diabetic nephropathy, with long-term current use of insulin (HCC)   [place] [facility] [place], Megan P, DO   7 months ago Type 2 diabetes mellitus with diabetic nephropathy, with long-term current use of insulin (HCC)   [place] [facility], Megan P, DO   7 months ago Foreign body, hand, superficial, right, initial encounter   [place] [facility] Mecum, Erin E, PA-C   10 months ago Type 2 diabetes mellitus with diabetic nephropathy, with long-term current use of insulin (HCC)   [place] [facility], Megan P, DO   11 months ago Type 2 diabetes mellitus with diabetic nephropathy, with long-term current use of insulin (HCC)   [place] [facility] [place], Megan P, DO              Refused Prescriptions Disp Refills   Continuous Glucose Sensor (FREESTYLE LIBRE 14 DAY SENSOR) MISC [Pharmacy Med Name: FREESTYLE LIBRE SENSOR 14D KIT] 2 each 0    Sig: USE AS DIRECTED TO  CHECK  BLOOD  GLUCOSE     Endocrinology: Diabetes - Testing Supplies Passed - 03/23/2023  4:59 PM      Passed - Valid encounter within last 12 months    Recent Outpatient Visits           5 months ago Type 2 diabetes mellitus with diabetic nephropathy, with long-term current use of insulin (HCC)   [place] [facility] [place], Megan P, DO   7 months ago Type 2 diabetes mellitus with diabetic nephropathy, with long-term current use of  insulin (HCC)   [facility] [place], Megan P, DO   7 months ago Foreign body, hand, superficial, right, initial encounter   [place] [facility] Mecum, Erin E, PA-C   10 months ago Type 2 diabetes mellitus with diabetic nephropathy, with long-term current use of insulin (HCC)    [place], Megan P, DO   11 months ago Type 2 diabetes mellitus with diabetic nephropathy, with long-term current use of insulin [facility])    [place] [place], [place], DO

## 2023-03-24 NOTE — Telephone Encounter (Signed)
 Duplicate request,last refilled 03/23/23.  Requested Prescriptions  Pending Prescriptions Disp Refills   Continuous Glucose Sensor (FREESTYLE LIBRE 14 DAY SENSOR) MISC [Pharmacy Med Name: FREESTYLE LIBRE 14DAY SENSOR] 1 each     Sig: USE AS DIRECTED EVERY 14 DAYS     Endocrinology: Diabetes - Testing Supplies Passed - 03/24/2023  9:24 AM      Passed - Valid encounter within last 12 months    Recent Outpatient Visits           5 months ago Type 2 diabetes mellitus with diabetic nephropathy, with long-term current use of insulin (HCC)   [place] [facility] [place], Megan P, DO   7 months ago Type 2 diabetes mellitus with diabetic nephropathy, with long-term current use of insulin (HCC)   [place] [facility] [place], Megan P, DO   7 months ago Foreign body, hand, superficial, right, initial encounter   [place] [facility] Mec[facility], Erin E, PA-C   10 months ago Type 2 diabetes mellitus with diabetic nephropathy, with long-term current use of insulin (HCC)   [place] [facility], Megan P, DO   11 months ago Type 2 diabetes mellitus with diabetic nephropathy, with long-term current use of insulin [facility])    [facility] [place], [place], DO

## 2023-03-24 NOTE — Telephone Encounter (Signed)
 Appt scheduled for 03-26-23 @ 4pm. Patient states he is out of his tirzepatide [facility]) 12.5 MG/0.5ML Pen.

## 2023-03-24 NOTE — Telephone Encounter (Signed)
 Rx was already sent in for 1 month

## 2023-03-24 NOTE — Telephone Encounter (Signed)
 Called and notified patient that medication was sent in for him.

## 2023-03-24 NOTE — Telephone Encounter (Signed)
 Needs follow up appointment.

## 2023-03-26 ENCOUNTER — Ambulatory Visit (INDEPENDENT_AMBULATORY_CARE_PROVIDER_SITE_OTHER): Admitting: Family Medicine

## 2023-03-26 ENCOUNTER — Encounter: Payer: Self-pay | Admitting: Family Medicine

## 2023-03-26 VITALS — BP 113/71 | HR 69 | Temp 97.9°F | Resp 17 | Ht 67.52 in | Wt 137.0 lb

## 2023-03-26 DIAGNOSIS — I129 Hypertensive chronic kidney disease with stage 1 through stage 4 chronic kidney disease, or unspecified chronic kidney disease: Secondary | ICD-10-CM | POA: Diagnosis not present

## 2023-03-26 DIAGNOSIS — E1121 Type 2 diabetes mellitus with diabetic nephropathy: Secondary | ICD-10-CM | POA: Diagnosis not present

## 2023-03-26 DIAGNOSIS — E785 Hyperlipidemia, unspecified: Secondary | ICD-10-CM | POA: Diagnosis not present

## 2023-03-26 DIAGNOSIS — Z794 Long term (current) use of insulin: Secondary | ICD-10-CM

## 2023-03-26 MED ORDER — ATORVASTATIN CALCIUM 20 MG PO TABS: 20.0000 mg | ORAL_TABLET | Freq: Every day | ORAL | 1 refills | Status: DC

## 2023-03-26 MED ORDER — MOUNJARO 12.5 MG/0.5ML ~~LOC~~ SOAJ: 12.5000 mg | SUBCUTANEOUS | 1 refills | Status: DC

## 2023-03-26 MED ORDER — LOSARTAN POTASSIUM 25 MG PO TABS: ORAL_TABLET | ORAL | 1 refills | Status: DC

## 2023-03-26 MED ORDER — METFORMIN HCL 1000 MG PO TABS: ORAL_TABLET | ORAL | 1 refills | Status: DC

## 2023-03-26 NOTE — Progress Notes (Signed)
 BP 113/71 (BP Location: Left Arm, Patient Position: Sitting, Cuff Size: Normal)   Pulse 69   Temp 97.9 F (36.6 C) (Oral)   Resp 17   Ht 5' 7.52" (1.715 m)   Wt 137 lb (62.1 kg)   SpO2 98%   BMI 21.13 kg/m    Subjective:    Patient ID: Arthur [name], male    DOB: 08/19/1965, 58 y.o.   MRN: [id number]  HPI: Arthur [name] is a 58 y.o. male  Chief Complaint  Patient presents with   Diabetes   Hypertension   Hyperlipidemia   DIABETES Hypoglycemic episodes:no Polydipsia/polyuria: no Visual disturbance: no Chest pain: no Paresthesias: no Glucose Monitoring: yes  Accucheck frequency: continuous Taking Insulin?: no Blood Pressure Monitoring: not checking Retinal Examination: Up to Date Foot Exam: Up to Date Diabetic Education: Completed Pneumovax: Up to Date Influenza: Up to Date Aspirin: yes  HYPERTENSION / HYPERLIPIDEMIA Satisfied with current treatment? yes Duration of hypertension: chronic BP monitoring frequency: not checking BP medication side effects: yes Past BP meds: losartan Duration of hyperlipidemia: chronic Cholesterol medication side effects: no Cholesterol supplements: none Past cholesterol medications: atorvastatin Medication compliance: excellent compliance Aspirin: yes Recent stressors: no Recurrent headaches: no Visual changes: no Palpitations: no Dyspnea: no Chest pain: no Lower extremity edema: no Dizzy/lightheaded: no   Relevant past medical, surgical, family and social history reviewed and updated as indicated. Interim medical history since our last visit reviewed. Allergies and medications reviewed and updated.  Review of Systems  Constitutional: Negative.   Respiratory: Negative.    Cardiovascular: Negative.   Musculoskeletal: Negative.   Neurological: Negative.   Psychiatric/Behavioral: Negative.      Per HPI unless specifically indicated above     Objective:    BP 113/71 (BP Location: Left Arm, Patient  Position: Sitting, Cuff Size: Normal)   Pulse 69   Temp 97.9 F (36.6 C) (Oral)   Resp 17   Ht 5' 7.52" (1.715 m)   Wt 137 lb (62.1 kg)   SpO2 98%   BMI 21.13 kg/m   Wt Readings from Last 3 Encounters:  03/26/23 137 lb (62.1 kg)  01/08/23 136 lb 4 oz (61.8 kg)  10/07/22 130 lb (59 kg)    Physical Exam Vitals and nursing note reviewed.  Constitutional:      General: He is not in acute distress.    Appearance: Normal appearance. He is normal weight. He is not ill-appearing, toxic-appearing or diaphoretic.  HENT:     Head: Normocephalic and atraumatic.     Right Ear: External ear normal.     Left Ear: External ear normal.     Nose: Nose normal.     Mouth/Throat:     Mouth: Mucous membranes are moist.     Pharynx: Oropharynx is clear.  Eyes:     General: No scleral icterus.       Right eye: No discharge.        Left eye: No discharge.     Extraocular Movements: Extraocular movements intact.     Conjunctiva/sclera: Conjunctivae normal.     Pupils: Pupils are equal, round, and reactive to light.  Cardiovascular:     Rate and Rhythm: Normal rate and regular rhythm.     Pulses: Normal pulses.     Heart sounds: Normal heart sounds. No murmur heard.    No friction rub. No gallop.  Pulmonary:     Effort: Pulmonary effort is normal. No respiratory distress.     Breath  sounds: Normal breath sounds. No stridor. No wheezing, rhonchi or rales.  Chest:     Chest wall: No tenderness.  Musculoskeletal:        General: Normal range of motion.     Cervical back: Normal range of motion and neck supple.  Skin:    General: Skin is warm and dry.     Capillary Refill: Capillary refill takes less than 2 seconds.     Coloration: Skin is not jaundiced or pale.     Findings: No bruising, erythema, lesion or rash.  Neurological:     General: No focal deficit present.     Mental Status: He is alert and oriented to person, place, and time. Mental status is at baseline.  Psychiatric:         Mood and Affect: Mood normal.        Behavior: Behavior normal.        Thought Content: Thought content normal.        Judgment: Judgment normal.     Results for orders placed or performed in visit on 03/26/23  Microalbumin, Urine Waived   Collection Time: 03/26/23  3:58 PM  Result Value Ref Range   Microalb, Ur Waived 80 (H) 0 - 19 mg/L   Creatinine, Urine Waived 50 10 - 300 mg/dL   Microalb/Creat Ratio 30-300 (H) <30 mg/g  Bayer DCA Hb A1c Waived   Collection Time: 03/26/23  3:58 PM  Result Value Ref Range   HB A1C (BAYER DCA - WAIVED) 7.2 (H) 4.8 - 5.6 %  Lipid Panel w/o Chol/HDL Ratio   Collection Time: 03/26/23  3:59 PM  Result Value Ref Range   Cholesterol, Total 159 100 - 199 mg/dL   Triglycerides 629 (H) 0 - 149 mg/dL   HDL 46 >52 mg/dL   VLDL Cholesterol Cal 48 (H) 5 - 40 mg/dL   LDL Chol Calc (NIH) 65 0 - 99 mg/dL  CBC with Differential/Platelet   Collection Time: 03/26/23  3:59 PM  Result Value Ref Range   WBC 6.1 3.4 - 10.8 x10E3/uL   RBC 4.19 4.14 - 5.80 x10E6/uL   Hemoglobin 12.1 (L) 13.0 - 17.7 g/dL   Hematocrit 84.1 (L) 32.4 - 51.0 %   MCV 86 79 - 97 fL   MCH 28.9 26.6 - 33.0 pg   MCHC 33.4 31.5 - 35.7 g/dL   RDW 40.1 02.7 - 25.3 %   Platelets 273 150 - 450 x10E3/uL   Neutrophils 61 Not Estab. %   Lymphs 29 Not Estab. %   Monocytes 7 Not Estab. %   Eos 3 Not Estab. %   Basos 0 Not Estab. %   Neutrophils Absolute 3.7 1.4 - 7.0 x10E3/uL   Lymphocytes Absolute 1.8 0.7 - 3.1 x10E3/uL   Monocytes Absolute 0.4 0.1 - 0.9 x10E3/uL   EOS (ABSOLUTE) 0.2 0.0 - 0.4 x10E3/uL   Basophils Absolute 0.0 0.0 - 0.2 x10E3/uL   Immature Granulocytes 0 Not Estab. %   Immature Grans (Abs) 0.0 0.0 - 0.1 x10E3/uL  Comprehensive metabolic panel   Collection Time: 03/26/23  3:59 PM  Result Value Ref Range   Glucose 178 (H) 70 - 99 mg/dL   BUN 11 6 - 24 mg/dL   Creatinine, Ser 6.64 0.76 - 1.27 mg/dL   eGFR 74 >40 HK/VQQ/5.95   BUN/Creatinine Ratio 10 9 - 20   Sodium 140  134 - 144 mmol/L   Potassium 4.8 3.5 - 5.2 mmol/L   Chloride 105 96 -  106 mmol/L   CO2 21 20 - 29 mmol/L   Calcium 9.3 8.7 - 10.2 mg/dL   Total Protein 6.0 6.0 - 8.5 g/dL   Albumin 4.1 3.8 - 4.9 g/dL   Globulin, Total 1.9 1.5 - 4.5 g/dL   Bilirubin Total <7.8 0.0 - 1.2 mg/dL   Alkaline Phosphatase 76 44 - 121 IU/L   AST 16 0 - 40 IU/L   ALT 15 0 - 44 IU/L      Assessment & Plan:   Problem List Items Addressed This Visit       Endocrine   Type 2 diabetes mellitus with renal manifestations (HCC) - Primary   Doing great with A1c of 7.2. Continue current regimen. Continue to monitor. Recheck 3 months. Call with any concerns.       Relevant Medications   atorvastatin (LIPITOR) 20 MG tablet   losartan (COZAAR) 25 MG tablet   metFORMIN (GLUCOPHAGE) 1000 MG tablet   tirzepatide (MOUNJARO) 12.5 MG/0.5ML Pen   Other Relevant Orders   Microalbumin, Urine Waived (Completed)   Lipid Panel w/o Chol/HDL Ratio (Completed)   Bayer DCA Hb A1c Waived (Completed)   CBC with Differential/Platelet (Completed)   Comprehensive metabolic panel (Completed)     Genitourinary   Benign hypertensive renal disease   Under good control on current regimen. Continue current regimen. Continue to monitor. Call with any concerns. Refills given. Labs drawn today.         Other   Dyslipidemia   Under good control on current regimen. Continue current regimen. Continue to monitor. Call with any concerns. Refills given. Labs drawn today.        Relevant Medications   atorvastatin (LIPITOR) 20 MG tablet     Follow up plan: Return in about 3 months (around 06/26/2023) for physical.

## 2023-03-26 NOTE — Assessment & Plan Note (Signed)
 Doing great with A1c of 7.2. Continue current regimen. Continue to monitor. Recheck 3 months. Call with any concerns.

## 2023-03-26 NOTE — Assessment & Plan Note (Signed)
 Under good control on current regimen. Continue current regimen. Continue to monitor. Call with any concerns. Refills given. Labs drawn today.

## 2023-03-27 LAB — LIPID PANEL W/O CHOL/HDL RATIO
Cholesterol, Total: 159 mg/dL (ref 100–199)
HDL: 46 mg/dL (ref >39)
LDL Chol Calc (NIH): 65 mg/dL (ref 0–99)
Triglycerides: 304 mg/dL — ABNORMAL HIGH (ref 0–149)
VLDL Cholesterol Cal: 48 mg/dL — ABNORMAL HIGH (ref 5–40)

## 2023-03-27 LAB — COMPREHENSIVE METABOLIC PANEL
ALT: 15 IU/L (ref 0–44)
AST: 16 IU/L (ref 0–40)
Albumin: 4.1 g/dL (ref 3.8–4.9)
Alkaline Phosphatase: 76 IU/L (ref 44–121)
BUN/Creatinine Ratio: 10 (ref 9–20)
BUN: 11 mg/dL (ref 6–24)
Bilirubin Total: <0.2 mg/dL (ref 0.0–1.2)
CO2: 21 mmol/L (ref 20–29)
Calcium: 9.3 mg/dL (ref 8.7–10.2)
Chloride: 105 mmol/L (ref 96–106)
Creatinine, Ser: 1.14 mg/dL (ref 0.76–1.27)
Globulin, Total: 1.9 g/dL (ref 1.5–4.5)
Glucose: 178 mg/dL — ABNORMAL HIGH (ref 70–99)
Potassium: 4.8 mmol/L (ref 3.5–5.2)
Sodium: 140 mmol/L (ref 134–144)
Total Protein: 6 g/dL (ref 6.0–8.5)
eGFR: 74 mL/min/{1.73_m2} (ref >59)

## 2023-03-27 LAB — CBC WITH DIFFERENTIAL/PLATELET
Basophils Absolute: 0 10*3/uL (ref 0.0–0.2)
Basos: 0 %
EOS (ABSOLUTE): 0.2 10*3/uL (ref 0.0–0.4)
Eos: 3 %
Hematocrit: 36.2 % — ABNORMAL LOW (ref 37.5–51.0)
Hemoglobin: 12.1 g/dL — ABNORMAL LOW (ref 13.0–17.7)
Immature Grans (Abs): 0 10*3/uL (ref 0.0–0.1)
Immature Granulocytes: 0 %
Lymphocytes Absolute: 1.8 10*3/uL (ref 0.7–3.1)
Lymphs: 29 %
MCH: 28.9 pg (ref 26.6–33.0)
MCHC: 33.4 g/dL (ref 31.5–35.7)
MCV: 86 fL (ref 79–97)
Monocytes Absolute: 0.4 10*3/uL (ref 0.1–0.9)
Monocytes: 7 %
Neutrophils Absolute: 3.7 10*3/uL (ref 1.4–7.0)
Neutrophils: 61 %
Platelets: 273 10*3/uL (ref 150–450)
RBC: 4.19 x10E6/uL (ref 4.14–5.80)
RDW: 12.9 % (ref 11.6–15.4)
WBC: 6.1 10*3/uL (ref 3.4–10.8)

## 2023-03-27 LAB — MICROALBUMIN, URINE WAIVED
Creatinine, Urine Waived: 50 mg/dL (ref 10–300)
Microalb, Ur Waived: 80 mg/L — ABNORMAL HIGH (ref 0–19)

## 2023-03-27 LAB — BAYER DCA HB A1C WAIVED: HB A1C (BAYER DCA - WAIVED): 7.2 % — ABNORMAL HIGH (ref 4.8–5.6)

## 2023-03-29 ENCOUNTER — Encounter: Payer: Self-pay | Admitting: Family Medicine

## 2023-04-26 ENCOUNTER — Telehealth: Payer: Self-pay | Admitting: *Deleted

## 2023-04-26 ENCOUNTER — Other Ambulatory Visit: Payer: Self-pay | Admitting: Family Medicine

## 2023-04-26 NOTE — Telephone Encounter (Signed)
 F[facility]ed over [facility].

## 2023-04-27 NOTE — Telephone Encounter (Signed)
 Requested medication (s) are due for refill today: Yes  Requested medication (s) are on the active medication list: Yes  Last refill:  03/26/23  Future visit scheduled: No  Notes to clinic:  Unable to refill per protocol, medication not assigned to the refill protocol.      Requested Prescriptions  Pending Prescriptions Disp Refills   MOUNJARO  12.5 MG/0.5ML Pen [Pharmacy Med Name: Mounjaro  12.5 MG/0.5ML Subcutaneous Solution Pen-injector] 4 mL 0    Sig: INJECT 12.5 INTO THE SKIN ONCE A WEEK. PLEASE CALL FOR AN APPOINTMENT FOR MORE REFILLS     Off-Protocol Failed - 04/27/2023  2:50 PM      Failed - Medication not assigned to a protocol, review manually.      Failed - Valid encounter within last 12 months    Recent Outpatient Visits           1 month ago Type 2 diabetes mellitus with diabetic nephropathy, with long-term current use of insulin  (HCC)   [place] [facility] [place], Megan P, DO               Continuous Glucose Sensor (FREESTYLE LIBRE 14 DAY SENSOR) MISC [Pharmacy Med Name: FREESTYLE LIBRE SENSOR 14D KIT] 2 each 0    Sig: USE AS DIRECTED TO  CHECK  BLOOD  GLUCOSE     Endocrinology: Diabetes - Testing Supplies Failed - 04/27/2023  2:50 PM      Failed - Valid encounter within last 12 months    Recent Outpatient Visits           1 month ago Type 2 diabetes mellitus with diabetic nephropathy, with long-term current use of insulin  [facility])   [place] [facility] [place], [place], DO

## 2023-04-28 NOTE — Telephone Encounter (Signed)
 Arthur [name] with [facility] care [facility] called regarding the clinical notes that were faxed to them. She said that the notes are from the patients perspective, and that they can't be self reporting. She said that it has to be stated that these are observations made by the provider in order for insurance to approve coverage. They will only accept what is noted by the provider. Her phone number is [phone] and fax number is [phone]

## 2023-04-28 NOTE — Telephone Encounter (Signed)
 [facility] and told them where to look and see where it states 90 degree . Arthur [name] states he will resubmit.

## 2023-04-29 ENCOUNTER — Other Ambulatory Visit: Payer: Self-pay | Admitting: Family Medicine

## 2023-04-29 ENCOUNTER — Encounter: Payer: Self-pay | Admitting: Family Medicine

## 2023-04-30 NOTE — Telephone Encounter (Signed)
 Disp Refills Start End   tirzepatide  (MOUNJARO ) 12.5 MG/0.5ML Pen 6 mL 1 03/26/2023 --   Sig - Route: Inject 12.5 mg into the skin once a week. - Subcutaneous   Sent to pharmacy as: tirzepatide  (MOUNJARO ) 12.5 MG/0.5ML Pen   E-Prescribing Status: Receipt confirmed by pharmacy (03/26/2023  4:32 PM EDT)    Requested Prescriptions  Pending Prescriptions Disp Refills   MOUNJARO  12.5 MG/0.5ML Pen [Pharmacy Med Name: Mounjaro  12.5 MG/0.5ML Subcutaneous Solution Pen-injector] 4 mL 0    Sig: INJECT 12.5 INTO THE SKIN ONCE A WEEK. PLEASE CALL FOR AN APPOINTMENT FOR MORE REFILLS     Off-Protocol Failed - 04/30/2023 12:36 PM      Failed - Medication not assigned to a protocol, review manually.      Failed - Valid encounter within last 12 months    Recent Outpatient Visits           1 month ago Type 2 diabetes mellitus with diabetic nephropathy, with long-term current use of insulin  [facility])   [place] [facility] [place], [place], DO

## 2023-04-30 NOTE — Telephone Encounter (Signed)
 Continuous Glucose Sensor (FREESTYLE LIBRE 14 DAY SENSOR) MISC6 each13/18/2025--Sig: USE AS DIRECTED EVERY 14 DAYSSent to pharmacy as: Continuous Glucose Sensor (FREESTYLE LIBRE 14 DAY SENSOR) MiscE-Prescribing Status: Receipt confirmed by pharmacy (03/23/2023  4:57 PM EDT)   Requested Prescriptions  Pending Prescriptions Disp Refills   Continuous Glucose Sensor (FREESTYLE LIBRE 14 DAY SENSOR) MISC [Pharmacy Med Name: FREESTYLE LIBRE SENSOR 14D KIT] 2 each 0    Sig: USE AS DIRECTED TO  CHECK  BLOOD  GLUCOSE     Endocrinology: Diabetes - Testing Supplies Failed - 04/30/2023 12:34 PM      Failed - Valid encounter within last 12 months    Recent Outpatient Visits           1 month ago Type 2 diabetes mellitus with diabetic nephropathy, with long-term current use of insulin  [facility])   [place] [facility] [place], [place], DO

## 2023-05-03 ENCOUNTER — Telehealth: Payer: Self-pay

## 2023-05-03 ENCOUNTER — Other Ambulatory Visit: Payer: Self-pay | Admitting: Family Medicine

## 2023-05-03 NOTE — Telephone Encounter (Signed)
 Incoming call from [facility] who state that pt's Xiaflex will be delivered to the office on 05/07/23.

## 2023-05-03 NOTE — Telephone Encounter (Signed)
 Copied from CRM [id number]. Topic: Clinical - Medication Refill >> May 03, 2023  5:37 PM Felizardo Hotter wrote: Most Recent Primary Care Visit:  Provider: Terre Ferri P  Department: [facility]  Visit Type: OFFICE VISIT  Date: 03/26/2023  Medication: losartan  (COZAAR ) 25 MG tablet and tirzepatide  (MOUNJARO ) 12.5 MG/0.5ML Pen  Has the patient contacted their pharmacy? Yes (Agent: If no, request that the patient contact the pharmacy for the refill. If patient does not wish to contact the pharmacy document the reason why and proceed with request.) (Agent: If yes, when and what did the pharmacy advise?)  Is this the correct pharmacy for this prescription? Yes If no, delete pharmacy and type the correct one.  This is the patient's preferred pharmacy:  [id number] - [place], [place] - [address] AT [facility] OF [address] [place] [address] Phone: [phone] Fax: [phone]  Has the prescription been filled recently? Yes  Is the patient out of the medication? Yes  Has the patient been seen for an appointment in the last year OR does the patient have an upcoming appointment? Yes  Can we respond through MyChart? Yes  Agent: Please be advised that Rx refills may take up to 3 business days. We ask that you follow-up with your pharmacy.

## 2023-05-04 ENCOUNTER — Telehealth: Payer: Self-pay | Admitting: Family Medicine

## 2023-05-04 NOTE — Telephone Encounter (Signed)
 Copied from CRM [id number]. Topic: Clinical - Medical Advice >> Apr 28, 2023 10:33 AM Rosamond Comes wrote: Reason for CRM: patient calling in. Patient is leaving on 05/08/23 to [place] and [place].    URGENT  appt with [facility] on 04/30/23 at 11:00am    Patient has appt with [facility] facility phone # [phone] to get  Yellow fever vaccine and Malaria pills. Facility requires permission from pcp to receive these vaccines.     Please call patient regarding this issue [phone]

## 2023-05-04 NOTE — Telephone Encounter (Signed)
 Responded to patient's mychart message.

## 2023-05-04 NOTE — Telephone Encounter (Signed)
 Copied from CRM [id number]. Topic: General - Other >> Apr 29, 2023  3:28 PM Alpha Arts wrote: Reason for CRM:  Patient is leaving on 05/08/23 to [place] and [place] and has appt with [facility] on 04/30/23 at 11:00am. He needs documentation stating he his good to get the yellow fever vaccine and malaria pills.   Callback #: [phone]

## 2023-05-05 NOTE — Telephone Encounter (Signed)
Addressing in separate encounter.

## 2023-05-06 ENCOUNTER — Other Ambulatory Visit: Payer: Self-pay

## 2023-05-06 MED ORDER — MOUNJARO 12.5 MG/0.5ML ~~LOC~~ SOAJ: 12.5000 mg | SUBCUTANEOUS | 0 refills | Status: DC

## 2023-05-06 NOTE — Telephone Encounter (Signed)
 Request too soon last ordered 03/26/23. Requested Prescriptions  Pending Prescriptions Disp Refills   losartan  (COZAAR ) 25 MG tablet 45 tablet 1    Sig: TAKE 1/2 TABLET(12.5 MG) BY MOUTH DAILY     Cardiovascular:  Angiotensin Receptor Blockers Failed - 05/06/2023  9:41 AM      Failed - Valid encounter within last 6 months    Recent Outpatient Visits           1 month ago Type 2 diabetes mellitus with diabetic nephropathy, with long-term current use of insulin  (HCC)   [place] [facility] [place], Megan P, DO              Passed - Cr in normal range and within 180 days    Creatinine, Ser  Date Value Ref Range Status  03/26/2023 1.14 0.76 - 1.27 mg/dL Final         Passed - K in normal range and within 180 days    Potassium  Date Value Ref Range Status  03/26/2023 4.8 3.5 - 5.2 mmol/L Final         Passed - Patient is not pregnant      Passed - Last BP in normal range    BP Readings from Last 1 Encounters:  03/26/23 113/71          tirzepatide  (MOUNJARO ) 12.5 MG/0.5ML Pen 6 mL 1    Sig: Inject 12.5 mg into the skin once a week.     Off-Protocol Failed - 05/06/2023  9:41 AM      Failed - Medication not assigned to a protocol, review manually.      Failed - Valid encounter within last 12 months    Recent Outpatient Visits           1 month ago Type 2 diabetes mellitus with diabetic nephropathy, with long-term current use of insulin  [facility])   [place] [facility] [place], Megan P, DO

## 2023-05-27 ENCOUNTER — Other Ambulatory Visit: Payer: Self-pay | Admitting: Urology

## 2023-06-15 NOTE — Progress Notes (Unsigned)
   06/16/2023 10:25 AM  Arthur [name] 02-08-1965 [id number]   Referring provider: Solomon Dupre, DO [address] [place],  [place] [address]  Urological history: 1. ED - Sildenafil  100 mg, on-demand dosing  2. Peyronie's disease  3. BPH with LU TS - PSA (06/2022) 0.8 - tamsulosin  0.4 mg daily   Chief Complaint  Patient presents with   Follow-up   HPI: Arthur [name] is a 58 y.o. male who presents today for erection induction for plaque marking and curvature measurement for Xiaflex this afternoon.    Previous records reviewed.     The procedure is discussed with patient.  He is allowed to ask questions.  Questions were answered to his satisfaction.  We were able to proceed to the titration.  Physical Exam:  BP (!) 145/87   Pulse 86   Constitutional:  Well nourished. Alert and oriented, No acute distress. GU: No CVA tenderness.  No bladder fullness or masses.  Patient with circumcised phallus.   Foreskin easily retracted   Urethral meatus is patent.  No penile discharge.   A Peyronie's plaque is palpated on the left shaft that starts just under the coronal ridge and extends towards the base in a linear manner.  It is about 1 1/2 cm in length.   Psychiatric: Normal mood and affect.   Procedure  Patient's left corpus cavernosum is identified.  An area near the base of the penis is cleansed with rubbing alcohol.  Careful to avoid the dorsal vein, 0.5 of Edex Lot # [id number]  exp # [id number]  is injected at a 90 degree angle into the left  corpus cavernosum near the base of the penis.  Patient experienced a very firm erection in 15 minutes.        Phenylephrine  (Lot # [id number]) diluted with normal saline (2026/05) to provide a final concentration of approximately 100 to 500 mcg per mL.  0.2 mL intracavernous injections of the freshly diluted phenylephrine  solution was administered  and detumescence was achieved.     Assessment & Plan:    1.  Peyronie's disease  - Peyronie's  plaque is marked and photographs are taken -Advised patient of the condition of priapism, painful erection lasting for more than four hours, and to contact the office or seek treatment in the ED immediately  - Return to clinic this afternoon for Xiaflex injection   Return for return this afternoon .  Matilde Son, PA-C   [facility] [address] [place], [place] [address] [phone]

## 2023-06-16 ENCOUNTER — Ambulatory Visit (INDEPENDENT_AMBULATORY_CARE_PROVIDER_SITE_OTHER): Admitting: Urology

## 2023-06-16 ENCOUNTER — Encounter: Payer: Self-pay | Admitting: Urology

## 2023-06-16 VITALS — BP 149/85 | HR 84

## 2023-06-16 DIAGNOSIS — N486 Induration penis plastica: Secondary | ICD-10-CM

## 2023-06-16 MED ORDER — COLLAGENASE CLOSTRID HISTOLYT 0.9 MG IJ SOLR
0.9000 mg | Freq: Once | INTRAMUSCULAR | Status: AC
  Administered 2023-06-16: 0.9 mg via INTRAMUSCULAR

## 2023-06-16 NOTE — Progress Notes (Signed)
   Procedure: Xiaflex injection (cycle #1/injection #1) left distal   Previously marked penile plaque site  Remained visible with marking pen.  The plaque was easily palpable on the left distal shaft . Xiaflex injection (0.58 mg) was then injected directly into the plaque at the point of maximal curvature at an angle after  to being warm to room temperature using a small TB needle.  He tolerated the procedure very well.    - Patient tolerated the procedure well - No intercourse x 4 weeks - Follow-up 6/12 for injection #2   Darlynn Elam, MD

## 2023-06-17 ENCOUNTER — Encounter: Payer: Self-pay | Admitting: Urology

## 2023-06-17 ENCOUNTER — Ambulatory Visit: Admitting: Urology

## 2023-06-17 VITALS — BP 128/69 | HR 99 | Ht 67.0 in | Wt 130.0 lb

## 2023-06-17 DIAGNOSIS — N486 Induration penis plastica: Secondary | ICD-10-CM | POA: Diagnosis not present

## 2023-06-17 MED ORDER — COLLAGENASE CLOSTRID HISTOLYT 0.9 MG IJ SOLR
0.9000 mg | Freq: Once | INTRAMUSCULAR | Status: AC
  Administered 2023-06-17: 0.9 mg via INTRAMUSCULAR

## 2023-06-17 MED ORDER — ALPROSTADIL (VASODILATOR) 20 MCG IC KIT
20.0000 ug | PACK | Freq: Once | INTRACAVERNOUS | Status: AC
  Administered 2023-06-17: 20 ug via BODY_CAVITY

## 2023-06-17 MED ORDER — COLLAGENASE CLOSTRID HISTOLYT 0.9 MG IJ SOLR: 0.9000 mg | Freq: Once | INTRAMUSCULAR | Status: DC

## 2023-06-17 NOTE — Progress Notes (Signed)
   Procedure: Xiaflex injection cycle #1/injection #2   Previously marked penile plaque site  Remained visible with marking pen.  The plaque was easily palpable on the distal left shaft . Xiaflex injection (0.58 mg) was then injected directly into the plaque at the point of maximal curvature at an angle after  to being warm to room temperature using a small TB needle.  He tolerated the procedure very well.    - Patient tolerated the procedure well - No intercourse x 4 weeks - Scheduled in office modeling tomorrow    Darlynn Elam, MD

## 2023-06-17 NOTE — Addendum Note (Signed)
 Addended byKatina Parlor on: 06/17/2023 03:39 PM   Modules accepted: Orders

## 2023-06-18 ENCOUNTER — Ambulatory Visit (INDEPENDENT_AMBULATORY_CARE_PROVIDER_SITE_OTHER): Admitting: Physician Assistant

## 2023-06-18 ENCOUNTER — Ambulatory Visit: Admitting: Urology

## 2023-06-18 DIAGNOSIS — N486 Induration penis plastica: Secondary | ICD-10-CM

## 2023-06-18 NOTE — Progress Notes (Signed)
 Xiaflex  modeling  Patient's Peyronie's plaque is identified and palpated on the left distal lateral shaft of the phallus. Wearing gloves, I grasped the plaque about 1 cm proximal and distal to the injection site, making sure to avoid direct pressure on the injection site.  Using the target plaque as a fulcrum point, I used both hands to apply firm, steady pressure to elongate and stretch the plaque.  The penis was bent opposite to the patient's penile curvature, with stretching to the point of moderate resistance. I held pressure for 30 seconds then released. After a 60 second rest period, I repeated the penile modeling technique for a total of 3 modeling attempts at 30 seconds for each attempt.  We discussed penile strengthening and bending exercises at home for the next 6 weeks; instruction sheet provided to the patient. He tolerated well with no concerns.   Return in about 6 weeks (around 07/30/2023) for Xiaflex  follow up with Dr. Cherylene Corrente.   Prezley Qadir, PA-C   I spent 12 minutes on the day of the encounter to include pre-visit record review, face-to-face time with the patient, and post-visit ordering of tests.

## 2023-06-25 ENCOUNTER — Other Ambulatory Visit: Payer: Self-pay

## 2023-06-25 ENCOUNTER — Ambulatory Visit (INDEPENDENT_AMBULATORY_CARE_PROVIDER_SITE_OTHER): Admitting: Family Medicine

## 2023-06-25 ENCOUNTER — Encounter: Payer: Self-pay | Admitting: Family Medicine

## 2023-06-25 VITALS — BP 124/76 | HR 84

## 2023-06-25 DIAGNOSIS — E1121 Type 2 diabetes mellitus with diabetic nephropathy: Secondary | ICD-10-CM | POA: Diagnosis not present

## 2023-06-25 DIAGNOSIS — Z794 Long term (current) use of insulin: Secondary | ICD-10-CM | POA: Diagnosis not present

## 2023-06-25 LAB — BAYER DCA HB A1C WAIVED: HB A1C (BAYER DCA - WAIVED): 8.8 % — ABNORMAL HIGH (ref 4.8–5.6)

## 2023-06-25 MED ORDER — MOUNJARO 12.5 MG/0.5ML ~~LOC~~ SOAJ
12.5000 mg | SUBCUTANEOUS | 1 refills | Status: DC
  Filled 2023-06-25 – 2023-07-23 (×2): qty 2, 28d supply, fill #0
  Filled 2023-08-23: qty 2, 28d supply, fill #1
  Filled 2023-09-27: qty 2, 28d supply, fill #2
  Filled 2023-10-25: qty 2, 28d supply, fill #3

## 2023-06-25 NOTE — Progress Notes (Signed)
 BP 124/76 (BP Location: Left Arm, Patient Position: Sitting, Cuff Size: Normal)   Pulse 84   SpO2 97%    Subjective:    Patient ID: Arthur [name], male    DOB: [birth year], 58 y.o.   MRN: [id number]  HPI: Arthur [name] is a 58 y.o. male  Chief Complaint  Patient presents with   Diabetes   DIABETES Hypoglycemic episodes:no Polydipsia/polyuria: no Visual disturbance: no Chest pain: no Paresthesias: no Glucose Monitoring: yes  Accucheck frequency: Daily Taking Insulin ?: no Blood Pressure Monitoring: not checking Retinal Examination: Up to Date Foot Exam: Up to Date Diabetic Education: Completed Pneumovax: Up to Date Influenza: Up to Date Aspirin : yes  Relevant past medical, surgical, family and social history reviewed and updated as indicated. Interim medical history since our last visit reviewed. Allergies and medications reviewed and updated.  Review of Systems  Constitutional: Negative.   Respiratory: Negative.    Cardiovascular: Negative.   Musculoskeletal: Negative.   Psychiatric/Behavioral: Negative.      Per HPI unless specifically indicated above     Objective:    BP 124/76 (BP Location: Left Arm, Patient Position: Sitting, Cuff Size: Normal)   Pulse 84   SpO2 97%   Wt Readings from Last 3 Encounters:  06/17/23 130 lb (59 kg)  03/26/23 137 lb (62.1 kg)  01/08/23 136 lb 4 oz (61.8 kg)    Physical Exam Vitals and nursing note reviewed.  Constitutional:      General: He is not in acute distress.    Appearance: Normal appearance. He is not ill-appearing, toxic-appearing or diaphoretic.  HENT:     Head: Normocephalic and atraumatic.     Right Ear: External ear normal.     Left Ear: External ear normal.     Nose: Nose normal.     Mouth/Throat:     Mouth: Mucous membranes are moist.     Pharynx: Oropharynx is clear.   Eyes:     General: No scleral icterus.       Right eye: No discharge.        Left eye: No discharge.     Extraocular  Movements: Extraocular movements intact.     Conjunctiva/sclera: Conjunctivae normal.     Pupils: Pupils are equal, round, and reactive to light.    Cardiovascular:     Rate and Rhythm: Normal rate and regular rhythm.     Pulses: Normal pulses.     Heart sounds: Normal heart sounds. No murmur heard.    No friction rub. No gallop.  Pulmonary:     Effort: Pulmonary effort is normal. No respiratory distress.     Breath sounds: Normal breath sounds. No stridor. No wheezing, rhonchi or rales.  Chest:     Chest wall: No tenderness.   Musculoskeletal:        General: Normal range of motion.     Cervical back: Normal range of motion and neck supple.   Skin:    General: Skin is warm and dry.     Capillary Refill: Capillary refill takes less than 2 seconds.     Coloration: Skin is not jaundiced or pale.     Findings: No bruising, erythema, lesion or rash.   Neurological:     General: No focal deficit present.     Mental Status: He is alert and oriented to person, place, and time. Mental status is at baseline.   Psychiatric:        Mood and Affect: Mood normal.  Behavior: Behavior normal.        Thought Content: Thought content normal.        Judgment: Judgment normal.     Results for orders placed or performed in visit on 03/26/23  Microalbumin, Urine Waived   Collection Time: 03/26/23  3:58 PM  Result Value Ref Range   Microalb, Ur Waived 80 (H) 0 - 19 mg/L   Creatinine, Urine Waived 50 10 - 300 mg/dL   Microalb/Creat Ratio 30-300 (H) <30 mg/g  Bayer DCA Hb A1c Waived   Collection Time: 03/26/23  3:58 PM  Result Value Ref Range   HB A1C (BAYER DCA - WAIVED) 7.2 (H) 4.8 - 5.6 %  Lipid Panel w/o Chol/HDL Ratio   Collection Time: 03/26/23  3:59 PM  Result Value Ref Range   Cholesterol, Total 159 100 - 199 mg/dL   Triglycerides 034 (H) 0 - 149 mg/dL   HDL 46 >74 mg/dL   VLDL Cholesterol Cal 48 (H) 5 - 40 mg/dL   LDL Chol Calc (NIH) 65 0 - 99 mg/dL  CBC with  Differential/Platelet   Collection Time: 03/26/23  3:59 PM  Result Value Ref Range   WBC 6.1 3.4 - 10.8 x10E3/uL   RBC 4.19 4.14 - 5.80 x10E6/uL   Hemoglobin 12.1 (L) 13.0 - 17.7 g/dL   Hematocrit 25.9 (L) 56.3 - 51.0 %   MCV 86 79 - 97 fL   MCH 28.9 26.6 - 33.0 pg   MCHC 33.4 31.5 - 35.7 g/dL   RDW 87.5 64.3 - 32.9 %   Platelets 273 150 - 450 x10E3/uL   Neutrophils 61 Not Estab. %   Lymphs 29 Not Estab. %   Monocytes 7 Not Estab. %   Eos 3 Not Estab. %   Basos 0 Not Estab. %   Neutrophils Absolute 3.7 1.4 - 7.0 x10E3/uL   Lymphocytes Absolute 1.8 0.7 - 3.1 x10E3/uL   Monocytes Absolute 0.4 0.1 - 0.9 x10E3/uL   EOS (ABSOLUTE) 0.2 0.0 - 0.4 x10E3/uL   Basophils Absolute 0.0 0.0 - 0.2 x10E3/uL   Immature Granulocytes 0 Not Estab. %   Immature Grans (Abs) 0.0 0.0 - 0.1 x10E3/uL  Comprehensive metabolic panel   Collection Time: 03/26/23  3:59 PM  Result Value Ref Range   Glucose 178 (H) 70 - 99 mg/dL   BUN 11 6 - 24 mg/dL   Creatinine, Ser 5.18 0.76 - 1.27 mg/dL   eGFR 74 >84 ZY/SAY/3.01   BUN/Creatinine Ratio 10 9 - 20   Sodium 140 134 - 144 mmol/L   Potassium 4.8 3.5 - 5.2 mmol/L   Chloride 105 96 - 106 mmol/L   CO2 21 20 - 29 mmol/L   Calcium  9.3 8.7 - 10.2 mg/dL   Total Protein 6.0 6.0 - 8.5 g/dL   Albumin 4.1 3.8 - 4.9 g/dL   Globulin, Total 1.9 1.5 - 4.5 g/dL   Bilirubin Total <6.0 0.0 - 1.2 mg/dL   Alkaline Phosphatase 76 44 - 121 IU/L   AST 16 0 - 40 IU/L   ALT 15 0 - 44 IU/L      Assessment & Plan:   Problem List Items Addressed This Visit       Endocrine   Type 2 diabetes mellitus with renal manifestations (HCC) - Primary   Not doing great with A1c of 8.8 up from 7.2. Has been off his medicine at least a week with each refill due to issues with authorization. We changed his mounjaro  to be  filled at [facility] and will get pharmacy involved. Recheck 3 months. Call with any concerns.       Relevant Medications   tirzepatide  (MOUNJARO ) 12.5 MG/0.5ML Pen   Other  Relevant Orders   Bayer DCA Hb A1c Waived   AMB Referral VBCI Care Management     Follow up plan: Return in about 3 months (around 09/25/2023) for physical.

## 2023-06-25 NOTE — Assessment & Plan Note (Signed)
 Not doing great with A1c of 8.8 up from 7.2. Has been off his medicine at least a week with each refill due to issues with authorization. We changed his mounjaro  to be filled at [facility] and will get pharmacy involved. Recheck 3 months. Call with any concerns.

## 2023-06-28 ENCOUNTER — Telehealth: Payer: Self-pay

## 2023-06-28 NOTE — Progress Notes (Signed)
 Care Guide Pharmacy Note  06/28/2023 Name: ASHTIN ROSNER MRN: [id number] DOB: 07-28-1965  Referred By: Vicci Duwaine SQUIBB, DO Reason for referral: Complex Care Management (Outreach to schedule with Pharm d )   STEEL KERNEY is a 58 y.o. year old male who is a primary care patient of Vicci Duwaine SQUIBB, DO.  Rodgers DELENA Pinal was referred to the pharmacist for assistance related to: DMII  An unsuccessful telephone outreach was attempted today to contact the patient who was referred to the pharmacy team for assistance with medication assistance. Additional attempts will be made to contact the patient.  Jeoffrey Buffalo , RMA     [facility]  [facility], [facility] Guide  Direct Dial: [phone]  Website: [email or web address]

## 2023-07-02 NOTE — Progress Notes (Signed)
 Care Guide Pharmacy Note  07/02/2023 Name: LARIN WEISSBERG MRN: [id number] DOB: [birth year]  Referred By: Vicci Duwaine SQUIBB, DO Reason for referral: Complex Care Management (Outreach to schedule with Pharm d )   DEONDRA WIGGER is a 58 y.o. year old male who is a primary care patient of Vicci Duwaine SQUIBB, DO.  PRATIK DALZIEL was referred to the pharmacist for assistance related to: DMII  Successful contact was made with the patient to discuss pharmacy services including being ready for the pharmacist to call at least 5 minutes before the scheduled appointment time and to have medication bottles and any blood pressure readings ready for review. The patient agreed to meet with the pharmacist via telephone visit on (date/time).07/22/2023  Jeoffrey Buffalo , RMA     [place]  [facility], [facility] Guide  Direct Dial: [phone]  Website: [email or web address]

## 2023-07-22 ENCOUNTER — Other Ambulatory Visit: Payer: Self-pay

## 2023-07-22 NOTE — Progress Notes (Unsigned)
 07/22/2023 Name: Arthur [name] MRN: [id number] DOB: 11/01/1965  Chief Complaint  Patient presents with   Diabetes Management Plan   Arthur [name] is a 58 y.o. year old male who presented for a telephone visit.   They were referred to the pharmacist by their PCP for assistance in managing diabetes.   Subjective:  Care Team: Primary Care Provider: Vicci Duwaine SQUIBB, [name] ; Next Scheduled Visit: 9/25  Medication Access/Adherence -Patient reports affordability concerns with their medications: No  -Patient reports access/transportation concerns to their pharmacy: No  -Patient reports adherence concerns with their medications:  Yes    Diabetes: Current medications: Mounjaro  12.5mg  weekly, Metformin  1000mg  BID -Patient using Libre for CGM and states FBG this morning was 155, 1 hour post-prandial 180 -Patient has been on Mounjaro  12.5mg  quite sometime and endorses tolerating well but does state he experiences some s/sx of heartburn and gas, which were present before starting this medication.  He has been seen by GI and prescribed omeprazole  but does not feel like this helps. -Patient reports issues consistently refilling Mounjaro  12.5mg  with [facility] which has led to missed doses, so refill recently sent to [facility] for processing -A1c 8.8% recently, up from 7.2%  Objective: Lab Results  Component Value Date   HGBA1C 8.8 (H) 06/25/2023   Lab Results  Component Value Date   CREATININE 1.14 03/26/2023   BUN 11 03/26/2023   NA 140 03/26/2023   K 4.8 03/26/2023   CL 105 03/26/2023   CO2 21 03/26/2023   Medications Reviewed Today     Reviewed by Arthur [name], RPH (Pharmacist) on 07/22/23 at 1516  Med List Status: <None>   Medication Order Taking? Sig Documenting Provider Last Dose Status Informant  aluminum-magnesium hydroxide 200-200 MG/5ML suspension [id number]  Take 15 mLs by mouth every 6 (six) hours as needed for indigestion. Provider, Historical, [name]  Active Self  ASPIRIN   LOW DOSE 81 MG tablet [id number]  TAKE 1 TABLET(81 MG) BY MOUTH DAILY Arthur [name]  Active   atorvastatin  (LIPITOR ) 20 MG tablet [id number]  Take 1 tablet (20 mg total) by mouth daily. Arthur [name]  Active   B-D TB SYRINGE 1CC/27GX1/2 27G X 1/2 1 ML MISC [id number]  USE FOR XIAFLEX  INJECTION Arthur [name]  Active   Continuous Glucose Sensor (FREESTYLE LIBRE 14 DAY SENSOR) [place] [id number] Yes USE AS DIRECTED EVERY 14 DAYS [name], [place] P, [name]  Active   ipratropium (ATROVENT ) 0.06 % nasal spray [id number]  USE 2 SPRAYS IN EACH NOSTRIL FOUR TIMES DAILY Arthur [name]  Active   losartan  (COZAAR ) 25 MG tablet [id number]  TAKE 1/2 TABLET(12.5 MG) BY MOUTH DAILY Arthur [name]  Active   metFORMIN  (GLUCOPHAGE ) 1000 MG tablet [id number] Yes TAKE 1 TABLET(1000 MG) BY MOUTH TWICE DAILY Arthur [name]  Active   tamsulosin  (FLOMAX ) 0.4 MG CAPS capsule [id number]  Take 1 capsule (0.4 mg total) by mouth daily. Arthur [name]  Active   tirzepatide  (MOUNJARO ) 12.5 MG/0.5ML Pen [id number] Yes Inject 12.5 mg into the skin once a week. Arthur [name]  Active            Assessment/Plan:   Diabetes: -Currently uncontrolled -Continue metformin  1000mg  BID and Mounjaro  12.5mg  weekly  -Continue to use Libre for CGM -Contacted [facility] to make sure Mounjaro  12.5mg  is ready for pick up- patient plans to pick up tomorrow, and will be due for next dose Monday -Due for A1c again  at 9/25 f/u visit with PCP; if >7%, consider increasing Mounjaro  to 15mg  weekly or additional pharmacotherapy with SGLT2  Arthur [name], PharmD, DPLA

## 2023-07-23 ENCOUNTER — Other Ambulatory Visit: Payer: Self-pay

## 2023-07-26 ENCOUNTER — Other Ambulatory Visit: Payer: Self-pay | Admitting: Family Medicine

## 2023-07-26 ENCOUNTER — Other Ambulatory Visit: Payer: Self-pay

## 2023-07-27 ENCOUNTER — Other Ambulatory Visit: Payer: Self-pay

## 2023-07-28 ENCOUNTER — Other Ambulatory Visit: Payer: Self-pay

## 2023-07-28 MED ORDER — FREESTYLE LIBRE 2 SENSOR MISC
1.0000 | 11 refills | Status: DC
  Filled 2023-07-28: qty 6, 84d supply, fill #0
  Filled 2023-08-03: qty 2, 28d supply, fill #0

## 2023-07-28 NOTE — Telephone Encounter (Signed)
 Rx 03/23/23 6 each 1RF- too soon Requested Prescriptions  Pending Prescriptions Disp Refills   Continuous Glucose Sensor (FREESTYLE LIBRE 14 DAY SENSOR) MISC [Pharmacy Med Name: FREESTYLE LIBRE 14DAY SENSOR] 2 each     Sig: USE 1 SENSOR APPLIED TO SKIN FOR GLUCOSE MONITORING EVERY 14 DAYS AS DIRECTED     Endocrinology: Diabetes - Testing Supplies Passed - 07/28/2023  3:41 PM      Passed - Valid encounter within last 12 months    Recent Outpatient Visits           1 month ago Type 2 diabetes mellitus with diabetic nephropathy, with long-term current use of insulin  (HCC)   [facility] [place], Megan P, DO   4 months ago Type 2 diabetes mellitus with diabetic nephropathy, with long-term current use of insulin  [facility])   [place] [facility] [place], Duwaine SQUIBB, DO       Future Appointments             In 2 days Stoioff, Glendia BROCKS, MD [facility] [place]

## 2023-07-28 NOTE — Telephone Encounter (Signed)
 Duplicate request, too soon for refill.  Requested Prescriptions  Pending Prescriptions Disp Refills   Continuous Glucose Sensor (FREESTYLE LIBRE 14 DAY SENSOR) MISC [Pharmacy Med Name: FREESTYLE LIBRE SENSOR 14D KIT] 2 each 0    Sig: USE AS DIRECTED TO  CHECK  BLOOD  GLUCOSE     Endocrinology: Diabetes - Testing Supplies Passed - 07/28/2023  3:53 PM      Passed - Valid encounter within last 12 months    Recent Outpatient Visits           1 month ago Type 2 diabetes mellitus with diabetic nephropathy, with long-term current use of insulin  (HCC)   [place] [facility] [place], Megan P, DO   4 months ago Type 2 diabetes mellitus with diabetic nephropathy, with long-term current use of insulin  (HCC)   [place] [facility] [place], Duwaine SQUIBB, DO       Future Appointments             In 2 days Stoioff, Glendia BROCKS, MD [place] [facility] [place]

## 2023-07-30 ENCOUNTER — Ambulatory Visit: Admitting: Urology

## 2023-08-03 ENCOUNTER — Other Ambulatory Visit: Payer: Self-pay | Admitting: Family Medicine

## 2023-08-03 ENCOUNTER — Other Ambulatory Visit: Payer: Self-pay

## 2023-08-04 NOTE — Telephone Encounter (Signed)
 Medication no longer listed on current medication list Requested Prescriptions  Pending Prescriptions Disp Refills   omeprazole  (PRILOSEC) 40 MG capsule [Pharmacy Med Name: OMEPRAZOLE  40MG  CAPSULES] 90 capsule 1    Sig: TAKE 1 CAPSULE(40 MG) BY MOUTH DAILY     Gastroenterology: Proton Pump Inhibitors Passed - 08/04/2023  9:59 AM      Passed - Valid encounter within last 12 months    Recent Outpatient Visits           1 month ago Type 2 diabetes mellitus with diabetic nephropathy, with long-term current use of insulin  (HCC)   [place] [facility] [place], Megan P, DO   4 months ago Type 2 diabetes mellitus with diabetic nephropathy, with long-term current use of insulin  (HCC)    [facility] [place], Duwaine SQUIBB, DO       Future Appointments             In 2 weeks Stoioff, Glendia BROCKS, MD [facility]

## 2023-08-23 ENCOUNTER — Other Ambulatory Visit: Payer: Self-pay | Admitting: Family Medicine

## 2023-08-23 ENCOUNTER — Ambulatory Visit: Admitting: Urology

## 2023-08-23 ENCOUNTER — Encounter: Payer: Self-pay | Admitting: Urology

## 2023-08-23 ENCOUNTER — Other Ambulatory Visit: Payer: Self-pay | Admitting: Urology

## 2023-08-23 ENCOUNTER — Telehealth: Payer: Self-pay | Admitting: *Deleted

## 2023-08-23 ENCOUNTER — Other Ambulatory Visit: Payer: Self-pay

## 2023-08-23 VITALS — BP 133/87 | HR 86 | Wt 130.0 lb

## 2023-08-23 DIAGNOSIS — R300 Dysuria: Secondary | ICD-10-CM

## 2023-08-23 DIAGNOSIS — N486 Induration penis plastica: Secondary | ICD-10-CM | POA: Diagnosis not present

## 2023-08-23 NOTE — Telephone Encounter (Signed)
 [facility]flex 

## 2023-08-23 NOTE — Progress Notes (Signed)
 08/23/2023 8:08 AM   [name] [name] [name] 1965-10-23 [id number]  Referring provider: Vicci Duwaine SQUIBB, DO [address] [place],  [place] [address]  Chief Complaint  Patient presents with   Abnormal Penile Curvature   Urologic history:  1.  Peyronie's disease Close to 90 degree leftward curvature Completed cycle 1 Xiaflex  06/17/2023  2.  Abnormal abdominal MRI Possible filling defect left upper pole calyx MRI liver 08/31/2022 Cystoscopy with left retrograde pyelogram 10/27/2022 showed no abnormalities  HPI: [name] [name] is a 58 y.o. male who presents for follow-up after completion of cycle 1 Xiaflex   Performed regular modeling postinjection As seen improvement in curvature but still >45 degrees Would like to proceed with cycle 2   PMH: Past Medical History:  Diagnosis Date   Arthritis    Diabetes mellitus with renal complications (HCC)    Erectile dysfunction associated with type 2 diabetes mellitus (HCC)    Exotropia    History of tobacco abuse    Hyperlipidemia    Hypertension    Long term current use of aspirin     Thoracic back pain     Surgical History: Past Surgical History:  Procedure Laterality Date   COLONOSCOPY WITH PROPOFOL  N/A 01/25/2019   Procedure: COLONOSCOPY WITH PROPOFOL ;  Surgeon: Unk Corinn Skiff, MD;  Location: [facility] ENDOSCOPY;  Service: Gastroenterology;  Laterality: N/A;  Positive on 12/22/2018   CYSTOSCOPY W/ RETROGRADES Left 10/27/2022   Procedure: CYSTOSCOPY WITH RETROGRADE PYELOGRAM;  Surgeon: Twylla Glendia BROCKS, MD;  Location: [facility] ORS;  Service: Urology;  Laterality: Left;   ESOPHAGOGASTRODUODENOSCOPY (EGD) WITH PROPOFOL  N/A 07/14/2022   Procedure: ESOPHAGOGASTRODUODENOSCOPY (EGD) WITH PROPOFOL ;  Surgeon: Therisa Bi, MD;  Location: [facility] ENDOSCOPY;  Service: Gastroenterology;  Laterality: N/A;   HIP SURGERY Right    SHOULDER SURGERY Left    bone spurs removed   URETEROSCOPY Left 10/27/2022   Procedure: DIAGNOSTIC URETEROSCOPY;  Surgeon:  Twylla Glendia BROCKS, MD;  Location: [facility] ORS;  Service: Urology;  Laterality: Left;    Home Medications:  Allergies as of 08/23/2023       Reactions   Codeine Nausea Only   Hydrocodone Nausea Only   Oxycodone  Nausea Only        Medication List        Accurate as of August 23, 2023  8:08 AM. If you have any questions, ask your nurse or doctor.          aluminum-magnesium hydroxide 200-200 MG/5ML suspension Take 15 mLs by mouth every 6 (six) hours as needed for indigestion.   Aspirin  Low Dose 81 MG tablet Generic drug: aspirin  EC TAKE 1 TABLET(81 MG) BY MOUTH DAILY   atorvastatin  20 MG tablet Commonly known as: LIPITOR  Take 1 tablet (20 mg total) by mouth daily.   B-D TB SYRINGE 1CC/27GX1/2 27G X 1/2 1 ML Misc Generic drug: TUBERCULIN SYR 1CC/27GX1/2 USE FOR XIAFLEX  INJECTION   FreeStyle Libre 14 Day Sensor Misc USE AS DIRECTED EVERY 14 DAYS   FreeStyle Libre 2 Sensor Misc Use 1 each every 14 (fourteen) days.   ipratropium 0.06 % nasal spray Commonly known as: ATROVENT  USE 2 SPRAYS IN EACH NOSTRIL FOUR TIMES DAILY   losartan  25 MG tablet Commonly known as: COZAAR  TAKE 1/2 TABLET(12.5 MG) BY MOUTH DAILY   metFORMIN  1000 MG tablet Commonly known as: GLUCOPHAGE  TAKE 1 TABLET(1000 MG) BY MOUTH TWICE DAILY   Mounjaro  12.5 MG/0.5ML Pen Generic drug: tirzepatide  Inject 12.5 mg into the skin once a week.   tamsulosin  0.4 MG  Caps capsule Commonly known as: FLOMAX  Take 1 capsule (0.4 mg total) by mouth daily.        Allergies:  Allergies  Allergen Reactions   Codeine Nausea Only   Hydrocodone Nausea Only   Oxycodone  Nausea Only    Family History: Family History  Problem Relation Age of Onset   Diabetes Father    Lung disease Mother    Diabetes Sister    Diabetes Maternal Aunt     Social History:  reports that he quit smoking about 37 years ago. His smoking use included cigarettes. He started smoking about 57 years ago. He has a 20 pack-year  smoking history. He has never used smokeless tobacco. He reports that he does not drink alcohol and does not use drugs.   Physical Exam: BP 133/87   Pulse 86   Wt 130 lb (59 kg)   BMI 20.36 kg/m   Constitutional:  Alert, No acute distress. HEENT: [place] AT Respiratory: Normal respiratory effort, no increased work of breathing. Psychiatric: Normal mood and affect.   Assessment & Plan:    1.  Peyronie's disease Schedule cycle 2 Xiaflex    Glendia JAYSON Barba, MD  [facility] [address] [place], [place] [address] [phone]

## 2023-08-24 ENCOUNTER — Other Ambulatory Visit: Payer: Self-pay

## 2023-08-25 ENCOUNTER — Other Ambulatory Visit: Payer: Self-pay

## 2023-08-25 NOTE — Telephone Encounter (Signed)
 Too soon for refill, LRF 07/28/23 for 30 and 11 RF.  Requested Prescriptions  Pending Prescriptions Disp Refills   Continuous Glucose Sensor (FREESTYLE LIBRE 2 SENSOR) MISC [Pharmacy Med Name: Continuous Glucose Sensor (FREESTYLE LIBRE 2 SENSOR) Misc] 30 each 11    Sig: Use 1 each every 14 (fourteen) days.     Endocrinology: Diabetes - Testing Supplies Passed - 08/25/2023 10:04 AM      Passed - Valid encounter within last 12 months    Recent Outpatient Visits           2 months ago Type 2 diabetes mellitus with diabetic nephropathy, with long-term current use of insulin  (HCC)   [place] [facility] [place], Megan P, DO   5 months ago Type 2 diabetes mellitus with diabetic nephropathy, with long-term current use of insulin  [place] [facility])    [facility] [place], [place], DO

## 2023-09-02 LAB — HM DIABETES EYE EXAM

## 2023-09-07 ENCOUNTER — Other Ambulatory Visit: Payer: Self-pay | Admitting: Family Medicine

## 2023-09-07 ENCOUNTER — Other Ambulatory Visit: Payer: Self-pay

## 2023-09-08 ENCOUNTER — Encounter: Payer: Self-pay | Admitting: Family Medicine

## 2023-09-08 NOTE — Telephone Encounter (Signed)
 Requested Prescriptions  Pending Prescriptions Disp Refills   Continuous Glucose Sensor (FREESTYLE LIBRE 14 DAY SENSOR) MISC [Pharmacy Med Name: FREESTYLE LIBRE 14DAY SENSOR] 2 each 3    Sig: USE 1 SENSOR APPLIED TO SKIN FOR GLUCOSE MONITORING EVERY 14 DAYS AS DIRECTED     Endocrinology: Diabetes - Testing Supplies Passed - 09/08/2023  1:19 PM      Passed - Valid encounter within last 12 months    Recent Outpatient Visits           2 months ago Type 2 diabetes mellitus with diabetic nephropathy, with long-term current use of insulin  (HCC)   [place] [facility] [place], Megan P, DO   5 months ago Type 2 diabetes mellitus with diabetic nephropathy, with long-term current use of insulin  [facility])   [place] [place], [place], DO

## 2023-09-27 ENCOUNTER — Other Ambulatory Visit: Payer: Self-pay

## 2023-09-30 ENCOUNTER — Other Ambulatory Visit: Payer: Self-pay

## 2023-09-30 ENCOUNTER — Encounter: Payer: Self-pay | Admitting: Family Medicine

## 2023-09-30 ENCOUNTER — Ambulatory Visit: Admitting: Family Medicine

## 2023-09-30 VITALS — BP 129/87 | HR 81 | Ht 67.0 in | Wt 138.2 lb

## 2023-09-30 DIAGNOSIS — Z0000 Encounter for general adult medical examination without abnormal findings: Secondary | ICD-10-CM | POA: Diagnosis not present

## 2023-09-30 DIAGNOSIS — Z794 Long term (current) use of insulin: Secondary | ICD-10-CM

## 2023-09-30 DIAGNOSIS — N400 Enlarged prostate without lower urinary tract symptoms: Secondary | ICD-10-CM | POA: Diagnosis not present

## 2023-09-30 DIAGNOSIS — E785 Hyperlipidemia, unspecified: Secondary | ICD-10-CM | POA: Diagnosis not present

## 2023-09-30 DIAGNOSIS — I129 Hypertensive chronic kidney disease with stage 1 through stage 4 chronic kidney disease, or unspecified chronic kidney disease: Secondary | ICD-10-CM

## 2023-09-30 DIAGNOSIS — E1121 Type 2 diabetes mellitus with diabetic nephropathy: Secondary | ICD-10-CM | POA: Diagnosis not present

## 2023-09-30 DIAGNOSIS — Z23 Encounter for immunization: Secondary | ICD-10-CM

## 2023-09-30 LAB — BAYER DCA HB A1C WAIVED: HB A1C (BAYER DCA - WAIVED): 8.1 % — ABNORMAL HIGH (ref 4.8–5.6)

## 2023-09-30 LAB — MICROALBUMIN, URINE WAIVED
Creatinine, Urine Waived: 50 mg/dL (ref 10–300)
Microalb, Ur Waived: 80 mg/L — ABNORMAL HIGH (ref 0–19)

## 2023-09-30 MED ORDER — LOSARTAN POTASSIUM 25 MG PO TABS: ORAL_TABLET | ORAL | 1 refills | Status: DC

## 2023-09-30 MED ORDER — ATORVASTATIN CALCIUM 20 MG PO TABS: 20.0000 mg | ORAL_TABLET | Freq: Every day | ORAL | 1 refills | Status: AC

## 2023-09-30 MED ORDER — IPRATROPIUM BROMIDE 0.06 % NA SOLN: 2.0000 | Freq: Four times a day (QID) | NASAL | 12 refills | Status: AC

## 2023-09-30 MED ORDER — METFORMIN HCL 1000 MG PO TABS: ORAL_TABLET | ORAL | 1 refills | Status: DC

## 2023-09-30 MED ORDER — FREESTYLE LIBRE 14 DAY SENSOR MISC
1.0000 | 12 refills | Status: DC
  Filled 2023-09-30 – 2023-10-25 (×2): qty 2, 28d supply, fill #0

## 2023-09-30 NOTE — Progress Notes (Signed)
 BP 129/87 (BP Location: Left Arm, Patient Position: Sitting, Cuff Size: Large)   Pulse 81   Ht 5' 7 (1.702 m)   Wt 138 lb 3.2 oz (62.7 kg)   SpO2 97%   BMI 21.65 kg/m    Subjective:    Patient ID: Arthur [name], male    DOB: 07/08/1965, 58 y.o.   MRN: [id number]  HPI: Arthur [name] is a 58 y.o. male presenting on 09/30/2023 for comprehensive medical examination. Current medical complaints include:  DIABETES Hypoglycemic episodes:no Polydipsia/polyuria: no Visual disturbance: no Chest pain: no Paresthesias: no Glucose Monitoring: yes  Accucheck frequency: continuous  Fasting glucose: 101 Taking Insulin ?: no Blood Pressure Monitoring: not checking Retinal Examination: Up to Date Foot Exam: Up to Date Diabetic Education: Completed Pneumovax: unknown Influenza: Up to Date Aspirin : yes  HYPERTENSION / HYPERLIPIDEMIA Satisfied with current treatment? yes Duration of hypertension: chronic BP monitoring frequency: not checking BP medication side effects: no Past BP meds: losartan  Duration of hyperlipidemia: chronic Cholesterol medication side effects: no Cholesterol supplements: none Past cholesterol medications: atorvastatin  Medication compliance: excellent compliance Aspirin : yes Recent stressors: no Recurrent headaches: no Visual changes: no Palpitations: no Dyspnea: no Chest pain: no Lower extremity edema: no Dizzy/lightheaded: no  He currently lives with: wife Interim Problems from his last visit: no  Depression Screen done today and results listed below:     03/26/2023    5:34 PM 10/01/2022    9:40 AM 08/13/2022    2:42 PM 07/29/2022    9:42 AM 05/12/2022    3:54 PM  Depression screen PHQ 2/9  Decreased Interest 0 0 0 1 0  Down, Depressed, Hopeless 0 0 0 0 0  PHQ - 2 Score 0 0 0 1 0  Altered sleeping  2 2 3 2   Tired, decreased energy  1 2 2 1   Change in appetite  1 1 1  0  Feeling bad or failure about yourself   0 0 0 0  Trouble concentrating  0  0 1 0  Moving slowly or fidgety/restless  0 0 0 0  Suicidal thoughts  0 0 0 0  PHQ-9 Score  4 5 8 3   Difficult doing work/chores  Not difficult at all Not difficult at all Somewhat difficult Somewhat difficult     Past Medical History:  Past Medical History:  Diagnosis Date   Arthritis    Diabetes mellitus with renal complications (HCC)    Erectile dysfunction associated with type 2 diabetes mellitus (HCC)    Exotropia    History of tobacco abuse    Hyperlipidemia    Hypertension    Long term current use of aspirin     Thoracic back pain     Surgical History:  Past Surgical History:  Procedure Laterality Date   COLONOSCOPY WITH PROPOFOL  N/A 01/25/2019   Procedure: COLONOSCOPY WITH PROPOFOL ;  Surgeon: Unk Corinn Skiff, MD;  Location: [facility] ENDOSCOPY;  Service: Gastroenterology;  Laterality: N/A;  Positive on 12/22/2018   CYSTOSCOPY W/ RETROGRADES Left 10/27/2022   Procedure: CYSTOSCOPY WITH RETROGRADE PYELOGRAM;  Surgeon: Twylla Glendia BROCKS, MD;  Location: [facility] ORS;  Service: Urology;  Laterality: Left;   ESOPHAGOGASTRODUODENOSCOPY (EGD) WITH PROPOFOL  N/A 07/14/2022   Procedure: ESOPHAGOGASTRODUODENOSCOPY (EGD) WITH PROPOFOL ;  Surgeon: Therisa Bi, MD;  Location: [facility] ENDOSCOPY;  Service: Gastroenterology;  Laterality: N/A;   HIP SURGERY Right    SHOULDER SURGERY Left    bone spurs removed   URETEROSCOPY Left 10/27/2022   Procedure: DIAGNOSTIC URETEROSCOPY;  Surgeon: Twylla,  Glendia BROCKS, MD;  Location: [facility] ORS;  Service: Urology;  Laterality: Left;    Medications:  Current Outpatient Medications on File Prior to Visit  Medication Sig   aluminum-magnesium hydroxide 200-200 MG/5ML suspension Take 15 mLs by mouth every 6 (six) hours as needed for indigestion.   ASPIRIN  LOW DOSE 81 MG tablet TAKE 1 TABLET(81 MG) BY MOUTH DAILY   B-D TB SYRINGE 1CC/27GX1/2 27G X 1/2 1 ML MISC USE FOR XIAFLEX  INJECTION   Continuous Glucose Sensor (FREESTYLE LIBRE 2 SENSOR) MISC Use 1 each every 14  (fourteen) days.   tamsulosin  (FLOMAX ) 0.4 MG CAPS capsule Take 1 capsule (0.4 mg total) by mouth daily.   tirzepatide  (MOUNJARO ) 12.5 MG/0.5ML Pen Inject 12.5 mg into the skin once a week.   XIAFLEX  0.9 MG SOLR AFTER RECONSTITUTION WITH SUPPLIED DILUENT , INJECT 0.58MG  INTO INTRALESIONAL PLAQUE 1X ON 2 DAYS, 1-3 DAYS APART EVERY 6 WEEKS. MAY REPEAT UP TO 4 CYCLES. DISCARD REMAINDER   No current facility-administered medications on file prior to visit.    Allergies:  Allergies  Allergen Reactions   Codeine Nausea Only   Hydrocodone Nausea Only   Oxycodone  Nausea Only    Social History:  Social History   Socioeconomic History   Marital status: Married    Spouse name: SHONTA, PHILLIS (Spouse) [phone] (Home Phone)   Number of children: Not on file   Years of education: Not on file   Highest education level: Not on file  Occupational History   Not on file  Tobacco Use   Smoking status: Former    Current packs/day: 0.00    Average packs/day: 1 pack/day for 20.0 years (20.0 ttl pk-yrs)    Types: Cigarettes    Start date: 01/05/1966    Quit date: 01/05/1986    Years since quitting: 37.7   Smokeless tobacco: Never  Vaping Use   Vaping status: Never Used  Substance and Sexual Activity   Alcohol use: No   Drug use: No   Sexual activity: Yes  Other Topics Concern   Not on file  Social History Narrative   Not on file   Social Drivers of Health   Financial Resource Strain: Not on file  Food Insecurity: Not on file  Transportation Needs: Not on file  Physical Activity: Not on file  Stress: Not on file  Social Connections: Unknown (05/20/2021)   [facility]   Social Network    Social Network: Not on file  Intimate Partner Violence: Unknown (04/11/2021)   Received from [facility]   HITS    Physically Hurt: Not on file    Insult or Talk Down To: Not on file    Threaten Physical Harm: Not on file    Scream or Curse: Not on file   Social History    Tobacco Use  Smoking Status Former   Current packs/day: 0.00   Average packs/day: 1 pack/day for 20.0 years (20.0 ttl pk-yrs)   Types: Cigarettes   Start date: 01/05/1966   Quit date: 01/05/1986   Years since quitting: 37.7  Smokeless Tobacco Never   Social History   Substance and Sexual Activity  Alcohol Use No    Family History:  Family History  Problem Relation Age of Onset   Diabetes Father    Lung disease Mother    Diabetes Sister    Diabetes Maternal Aunt     Past medical history, surgical history, medications, allergies, family history and social history reviewed with patient today and changes made  to appropriate areas of the chart.   Review of Systems  Constitutional: Negative.   HENT: Negative.    Eyes: Negative.   Respiratory: Negative.    Cardiovascular: Negative.   Gastrointestinal:  Positive for heartburn. Negative for abdominal pain, blood in stool, constipation, diarrhea, melena, nausea and vomiting.  Genitourinary: Negative.   Musculoskeletal: Negative.   Skin: Negative.   Neurological: Negative.   Endo/Heme/Allergies: Negative.   Psychiatric/Behavioral: Negative.     All other ROS negative except what is listed above and in the HPI.      Objective:    BP 129/87 (BP Location: Left Arm, Patient Position: Sitting, Cuff Size: Large)   Pulse 81   Ht 5' 7 (1.702 m)   Wt 138 lb 3.2 oz (62.7 kg)   SpO2 97%   BMI 21.65 kg/m   Wt Readings from Last 3 Encounters:  09/30/23 138 lb 3.2 oz (62.7 kg)  08/23/23 130 lb (59 kg)  06/17/23 130 lb (59 kg)    Physical Exam Vitals and nursing note reviewed.  Constitutional:      General: He is not in acute distress.    Appearance: Normal appearance. He is normal weight. He is not ill-appearing, toxic-appearing or diaphoretic.  HENT:     Head: Normocephalic and atraumatic.     Right Ear: Tympanic membrane, ear canal and external ear normal. There is no impacted cerumen.     Left Ear: Tympanic membrane, ear  canal and external ear normal. There is no impacted cerumen.     Nose: Nose normal. No congestion or rhinorrhea.     Mouth/Throat:     Mouth: Mucous membranes are moist.     Pharynx: Oropharynx is clear. No oropharyngeal exudate or posterior oropharyngeal erythema.  Eyes:     General: No scleral icterus.       Right eye: No discharge.        Left eye: No discharge.     Extraocular Movements: Extraocular movements intact.     Conjunctiva/sclera: Conjunctivae normal.     Pupils: Pupils are equal, round, and reactive to light.  Neck:     Vascular: No carotid bruit.  Cardiovascular:     Rate and Rhythm: Normal rate and regular rhythm.     Pulses: Normal pulses.     Heart sounds: No murmur heard.    No friction rub. No gallop.  Pulmonary:     Effort: Pulmonary effort is normal. No respiratory distress.     Breath sounds: Normal breath sounds. No stridor. No wheezing, rhonchi or rales.  Chest:     Chest wall: No tenderness.  Abdominal:     General: Abdomen is flat. Bowel sounds are normal. There is no distension.     Palpations: Abdomen is soft. There is no mass.     Tenderness: There is no abdominal tenderness. There is no right CVA tenderness, left CVA tenderness, guarding or rebound.     Hernia: No hernia is present.  Genitourinary:    Comments: Genital exam deferred with shared decision making Musculoskeletal:        General: No swelling, tenderness, deformity or signs of injury.     Cervical back: Normal range of motion and neck supple. No rigidity. No muscular tenderness.     Right lower leg: No edema.     Left lower leg: No edema.  Lymphadenopathy:     Cervical: No cervical adenopathy.  Skin:    General: Skin is warm and dry.  Capillary Refill: Capillary refill takes less than 2 seconds.     Coloration: Skin is not jaundiced or pale.     Findings: No bruising, erythema, lesion or rash.  Neurological:     General: No focal deficit present.     Mental Status: He is  alert and oriented to person, place, and time.     Cranial Nerves: No cranial nerve deficit.     Sensory: No sensory deficit.     Motor: No weakness.     Coordination: Coordination normal.     Gait: Gait normal.     Deep Tendon Reflexes: Reflexes normal.  Psychiatric:        Mood and Affect: Mood normal.        Behavior: Behavior normal.        Thought Content: Thought content normal.        Judgment: Judgment normal.     Results for orders placed or performed in visit on 09/08/23  HM DIABETES EYE EXAM   Collection Time: 09/02/23 12:00 AM  Result Value Ref Range   HM Diabetic Eye Exam No Retinopathy No Retinopathy      Assessment & Plan:   Problem List Items Addressed This Visit       Endocrine   Type 2 diabetes mellitus with renal manifestations (HCC)   Doing well with A1c of 8.1 down from 8.8. Continue diet and exercise and current regimen. Continue to monitor. Call with any concerns.       Relevant Medications   atorvastatin  (LIPITOR ) 20 MG tablet   losartan  (COZAAR ) 25 MG tablet   metFORMIN  (GLUCOPHAGE ) 1000 MG tablet   Other Relevant Orders   Microalbumin, Urine Waived   Bayer DCA Hb A1c Waived   Comprehensive metabolic panel with GFR   CBC with Differential/Platelet   Lipid Panel w/o Chol/HDL Ratio     Genitourinary   Benign hypertensive renal disease   Under good control on current regimen. Continue current regimen. Continue to monitor. Call with any concerns. Refills given. Labs drawn today.       Relevant Orders   TSH   Benign prostatic hyperplasia   Rechecking labs today. Await results. Treat as needed.       Relevant Orders   PSA     Other   Dyslipidemia   Under good control on current regimen. Continue current regimen. Continue to monitor. Call with any concerns. Refills given. Labs drawn today.        Relevant Medications   atorvastatin  (LIPITOR ) 20 MG tablet   Other Relevant Orders   Lipid Panel w/o Chol/HDL Ratio   Other Visit  Diagnoses       Routine general medical examination at a health care facility    -  Primary   Vaccines updated. Screening labs checked today. Colonoscopy up to date. Continue diet and exercise. Call with any concerns.     Needs flu shot       Flu shot given today.   Relevant Orders   Flu vaccine trivalent PF, 6mos and older(Flulaval,Afluria,Fluarix,Fluzone) (Completed)     Need for diphtheria-tetanus-pertussis (Tdap) vaccine       Tdap given today.   Relevant Orders   Tdap vaccine greater than or equal to 7yo IM        Discussed aspirin  prophylaxis for myocardial infarction prevention and decision was made to continue ASA  LABORATORY TESTING:  Health maintenance labs ordered today as discussed above.   The natural history of prostate cancer and  ongoing controversy regarding screening and potential treatment outcomes of prostate cancer has been discussed with the patient. The meaning of a false positive PSA and a false negative PSA has been discussed. He indicates understanding of the limitations of this screening test and wishes to proceed with screening PSA testing.   IMMUNIZATIONS:   - Tdap: Tetanus vaccination status reviewed: Tdap vaccination indicated and given today. - Influenza: Up to date - Prevnar: will get next time - COVID: Refused - HPV: Not applicable - Shingrix  vaccine: Up to date  SCREENING: - Colonoscopy: Up to date  Discussed with patient purpose of the colonoscopy is to detect colon cancer at curable precancerous or early stages   PATIENT COUNSELING:    Sexuality: Discussed sexually transmitted diseases, partner selection, use of condoms, avoidance of unintended pregnancy  and contraceptive alternatives.   Advised to avoid cigarette smoking.  I discussed with the patient that most people either abstain from alcohol or drink within safe limits (<=14/week and <=4 drinks/occasion for males, <=7/weeks and <= 3 drinks/occasion for females) and that the risk for  alcohol disorders and other health effects rises proportionally with the number of drinks per week and how often a drinker exceeds daily limits.  Discussed cessation/primary prevention of drug use and availability of treatment for abuse.   Diet: Encouraged to adjust caloric intake to maintain  or achieve ideal body weight, to reduce intake of dietary saturated fat and total fat, to limit sodium intake by avoiding high sodium foods and not adding table salt, and to maintain adequate dietary potassium and calcium  preferably from fresh fruits, vegetables, and low-fat dairy products.    stressed the importance of regular exercise  Injury prevention: Discussed safety belts, safety helmets, smoke detector, smoking near bedding or upholstery.   Dental health: Discussed importance of regular tooth brushing, flossing, and dental visits.   Follow up plan: NEXT PREVENTATIVE PHYSICAL DUE IN 1 YEAR. Return in about 3 months (around 12/30/2023).

## 2023-09-30 NOTE — Assessment & Plan Note (Signed)
 Under good control on current regimen. Continue current regimen. Continue to monitor. Call with any concerns. Refills given. Labs drawn today.

## 2023-09-30 NOTE — Assessment & Plan Note (Signed)
 Doing well with A1c of 8.1 down from 8.8. Continue diet and exercise and current regimen. Continue to monitor. Call with any concerns.

## 2023-09-30 NOTE — Assessment & Plan Note (Signed)
 Rechecking labs today. Await results. Treat as needed.

## 2023-10-01 ENCOUNTER — Ambulatory Visit: Payer: Self-pay | Admitting: Family Medicine

## 2023-10-01 LAB — CBC WITH DIFFERENTIAL/PLATELET
Basophils Absolute: 0 x10E3/uL (ref 0.0–0.2)
Basos: 0 %
EOS (ABSOLUTE): 0.3 x10E3/uL (ref 0.0–0.4)
Eos: 6 %
Hematocrit: 41.6 % (ref 37.5–51.0)
Hemoglobin: 13.7 g/dL (ref 13.0–17.7)
Immature Grans (Abs): 0 x10E3/uL (ref 0.0–0.1)
Immature Granulocytes: 0 %
Lymphocytes Absolute: 2.1 x10E3/uL (ref 0.7–3.1)
Lymphs: 39 %
MCH: 28.7 pg (ref 26.6–33.0)
MCHC: 32.9 g/dL (ref 31.5–35.7)
MCV: 87 fL (ref 79–97)
Monocytes Absolute: 0.4 x10E3/uL (ref 0.1–0.9)
Monocytes: 8 %
Neutrophils Absolute: 2.4 x10E3/uL (ref 1.4–7.0)
Neutrophils: 47 %
Platelets: 311 x10E3/uL (ref 150–450)
RBC: 4.77 x10E6/uL (ref 4.14–5.80)
RDW: 13.2 % (ref 11.6–15.4)
WBC: 5.2 x10E3/uL (ref 3.4–10.8)

## 2023-10-01 LAB — COMPREHENSIVE METABOLIC PANEL WITH GFR
ALT: 25 IU/L (ref 0–44)
AST: 23 IU/L (ref 0–40)
Albumin: 4.5 g/dL (ref 3.8–4.9)
Alkaline Phosphatase: 81 IU/L (ref 47–123)
BUN/Creatinine Ratio: 7 — ABNORMAL LOW (ref 10–24)
BUN: 9 mg/dL (ref 8–27)
Bilirubin Total: 0.4 mg/dL (ref 0.0–1.2)
CO2: 21 mmol/L (ref 20–29)
Calcium: 9.9 mg/dL (ref 8.6–10.2)
Chloride: 102 mmol/L (ref 96–106)
Creatinine, Ser: 1.29 mg/dL — ABNORMAL HIGH (ref 0.76–1.27)
Globulin, Total: 2.3 g/dL (ref 1.5–4.5)
Glucose: 108 mg/dL — ABNORMAL HIGH (ref 70–99)
Potassium: 4 mmol/L (ref 3.5–5.2)
Sodium: 137 mmol/L (ref 134–144)
Total Protein: 6.8 g/dL (ref 6.0–8.5)
eGFR: 63 mL/min/1.73 (ref >59)

## 2023-10-01 LAB — TSH: TSH: 2.41 u[IU]/mL (ref 0.450–4.500)

## 2023-10-01 LAB — LIPID PANEL W/O CHOL/HDL RATIO
Cholesterol, Total: 195 mg/dL (ref 100–199)
HDL: 52 mg/dL (ref >39)
LDL Chol Calc (NIH): 126 mg/dL — ABNORMAL HIGH (ref 0–99)
Triglycerides: 96 mg/dL (ref 0–149)
VLDL Cholesterol Cal: 17 mg/dL (ref 5–40)

## 2023-10-01 LAB — PSA: Prostate Specific Ag, Serum: 1.4 ng/mL (ref 0.0–4.0)

## 2023-10-06 ENCOUNTER — Telehealth: Payer: Self-pay | Admitting: *Deleted

## 2023-10-06 NOTE — Telephone Encounter (Signed)
 [facility] sent a request to about a refill from xiaflex . Sent notes today . Than I happen to look in refrigerator and his medication is here. We need to schedule his three appointments

## 2023-10-11 ENCOUNTER — Other Ambulatory Visit: Payer: Self-pay

## 2023-10-19 ENCOUNTER — Other Ambulatory Visit: Payer: Self-pay

## 2023-10-19 ENCOUNTER — Telehealth: Payer: Self-pay | Admitting: Pharmacy Technician

## 2023-10-19 NOTE — Progress Notes (Signed)
 10/19/2023 Name: Arthur [name] MRN: [id number] DOB: [birth year]  Patient is appearing for a follow-up visit with the population health pharmacy technician. Last engaged with the clinical pharmacist to discuss diabetes on 07/22/2023. Contacted patient today to discuss diabetes.   Plan from last clinical pharmacist appointment:  Diabetes: -Currently uncontrolled -Continue metformin  1000mg  BID and Mounjaro  12.5mg  weekly  -Continue to use Libre for CGM -Contacted [facility] to make sure Mounjaro  12.5mg  is ready for pick up- patient plans to pick up tomorrow, and will be due for next dose Monday -Due for A1c again at 9/25 f/u visit with PCP; if >7%, consider increasing Mounjaro  to 15mg  weekly or additional pharmacotherapy with SGLT2  Medication Adherence Barriers Identified:  Patient made recommended medication changes per plan: Yes Patient informs he is taking Mounjaro  12.5 every week and reports he is tolerating the dose well. He informs he plans to go by pharmacy today and see if they will replace a pen that is failing.He informs it is not giving the dose when he presses the button. He informs the pen acts as if the button jams. He informs once we removes pen from his skin to look at it the button will recover and he gets sprayed with the medicine. He will contact us  if they will not replace or if they will not fill it due to it being too soon. He inquired about a sample if this were to happen.  Patient also reports taking Metformin  100mg  twice a day. He reports he tolerates this ok if he takes it with food. However, if he forgets to take it with food it causes stomach upset. He wonders if there is something else he could take. He also would like to know how Metformin  works and plays a roles in his diabetes management. Dr Annemarie shows last filled in March. Will send questions to PharmD to address. Patient is amenable to receiving a MyChart message about this.  Patient informs that he does not receive  the same generation of FreeStyle sensors when he picks them up from the pharmacy. He informs this causes him to have to flip between 2 different apps depending on which generation he receives. He is inquiring if provider or pharmacist can send in an order in which the pharmacy dispenses the same generation of sensor each time. He inquires if prescription for the sensors could be sent in for the latest generation.  Access issues with any new medication or testing device: Yes As outlined above.  Patient informs he has medications and supplies on hand. Per Dr Annemarie fill history data, 2 FreeStyle sensors were filled on 9/22, qty of 2  Mounjaro  filled 9/22 and Metformin  last filled 3/21 for 180 tablets.  Patient is checking blood sugars as prescribed: Yes Patient uses FreeStyle to check his blood sugar. Current blood sugar readings: 162, trending down from breakfast blood sugar reading. He had oatmeal and coffee for breakfast. Current CGM readings: He reports average blood glucose last 7 days 130, lat 14 days 134, last 30 days 128. He reports time in range 94%, time out of range 6%. No lows below 70 reported. Patient informs he is okay with his blood sugar being up slightly as opposed to low as he has dealt with low blood sugar before and prefers not to deal with that issue again.   Medication Adherence Barriers Addressed/Actions Taken:  Reviewed medication changes per plan from last clinical pharmacist note. Will discuss medication access concerns with pharmacist Educated patient to contact  pharmacy regarding new prescriptions Reviewed instructions for monitoring blood sugars at home and reminded patient to keep a written log to review with pharmacist Reminded patient of date/time of upcoming clinical pharmacist follow up and any upcoming PCP/specialists visits. Patient denies transportation barriers to the appointment. No  Next clinical pharmacist appointment is scheduled for: 11/02/2023  Arthur [name], CPhT [facility] Office: [phone] Email: [email or web address]

## 2023-10-25 ENCOUNTER — Other Ambulatory Visit: Payer: Self-pay

## 2023-11-01 NOTE — Progress Notes (Unsigned)
   11/02/2023 Name: DARCY CORDNER MRN: [id number] DOB: 10-04-1965  No chief complaint on file.  Arthur [name] is a 58 y.o. year old male who presented for a telephone follow-up visit   They were referred to the pharmacist by their PCP for assistance in managing diabetes.   Subjective:  Care Team: Primary Care Provider: Vicci Duwaine SQUIBB, DO ; Next Scheduled Visit: 01/13/2024  Diabetes: Current medications: Mounjaro  12.5mg  weekly, Metformin  1000mg  BID -Patient using Libre for CGM and states FBG this morning, and data reflects: Average BG of 133 Time in Range (70-180) 86% High 14% No Lows -Patient has been on Mounjaro  12.5mg  quite sometime and endorses tolerating well but does state he experiences some s/sx of heartburn and gas, which were present before starting this medication.  He has been seen by GI and prescribed omeprazole  but does not feel like this helps, so he is currently using a version of apple cider vinegar to help with digestion. He states this does help, but he would prefer not to need anything for heartburn/gas prevention/treatment. -Patient states metformin  does cause stomach upset if he forgets to/is unable to take with food -Was having issues being able to consistently refill Mounjaro , but this has not been a problem since he started filling this medication at [facility] pharmacy  -A1c 8.1% 9/25 down from 8.8% previously -Patient has been getting refills for Libre 14 day sensors at times and then [place] 2 at times, which is causing him to have to switch back and forth between 2 different mobile apps -Statin on board for ASCVD risk reduction:  atorvastatin  40mg  daily- Ldl elevated at 126 on 9/25, but patient states he was out of medication 2+ weeks prior to this lab work -[employer] for cardiorenal protection:  losartan  25mg  daily; BP 129/87 at 9/25 PCP visit -UACR 30-300 on 9/25  Objective: Lab Results  Component Value Date   HGBA1C 8.1 (H) 09/30/2023   Lab  Results  Component Value Date   CREATININE 1.29 (H) 09/30/2023   BUN 9 09/30/2023   NA 137 09/30/2023   K 4.0 09/30/2023   CL 102 09/30/2023   CO2 21 09/30/2023   Assessment/Plan:   Diabetes: -A1c, UACR, and LDL not at goal for patient with diabetes -BP is moderately controlled based on last OV reading -I recommend changing metformin  to XR tablets to see if this helps with stomach upset (could improve adherence); patient is in agreement, and order pending for metformin  XR 500mg  taking 2 tablets BID with food -Continue Mounjaro  12.5mg  weekly -I recommend changing patient to [place] 3+ sensors since 14d and Libre 2 no longer made; patient is in agreement, and order pending for PCP to sign -I will send patient an email to link Libre data to [employer] -Sending information on dietary modifications that could help prevent heartburn/gas -Consider increasing Mounjaro  to 15mg  weekly if additional BG lowering is needed; it also appears patient was previously on Jardiance , but I do not see notes reflecting why this medication was stopped.  Could be beneficial based on most recent A1c and UACR. -I recommend follow-up lipid panel at next PCP visit; increase atorvastatin  to 40mg  daily if LDL still >70 with medication back on board consistently  Arthur [name], PharmD, DPLA

## 2023-11-02 ENCOUNTER — Other Ambulatory Visit: Payer: Self-pay

## 2023-11-02 DIAGNOSIS — E1121 Type 2 diabetes mellitus with diabetic nephropathy: Secondary | ICD-10-CM

## 2023-11-02 DIAGNOSIS — I129 Hypertensive chronic kidney disease with stage 1 through stage 4 chronic kidney disease, or unspecified chronic kidney disease: Secondary | ICD-10-CM

## 2023-11-02 DIAGNOSIS — E785 Hyperlipidemia, unspecified: Secondary | ICD-10-CM

## 2023-11-02 MED ORDER — FREESTYLE LIBRE 3 PLUS SENSOR MISC: 3 refills | Status: AC

## 2023-11-02 MED ORDER — METFORMIN HCL ER 500 MG PO TB24
1000.0000 mg | ORAL_TABLET | Freq: Two times a day (BID) | ORAL | 1 refills | Status: AC
Start: 2023-11-02 — End: ?

## 2023-11-07 NOTE — Progress Notes (Unsigned)
   Arthur [name] 1965-10-12 [id number]    Referring provider: Vicci Duwaine SQUIBB, DO [address] [place],  [place] [address]  Urological history: 1. ED - Sildenafil  100 mg, on-demand dosing   2. Peyronie's disease - first Xiaflex  cycle completed (06/2023)    3. BPH with LU TS - PSA (06/2022) 0.8 - tamsulosin  0.4 mg daily   No chief complaint on file.  HPI: Arthur [name] is a 58 y.o. man who presents today for erection induction for plaque marking and curvature measurement for Xiaflex  this afternoon.     The procedure is discussed with patient.  He is allowed to ask questions.  Questions were answered to his satisfaction.  We were able to proceed to the titration.  Physical Exam:  There were no vitals taken for this visit.  Constitutional:  Well nourished. Alert and oriented, No acute distress. GU: No CVA tenderness.  No bladder fullness or masses.  Patient with circumcised/uncircumcised phallus. ***Foreskin easily retracted***  Urethral meatus is patent.  No penile discharge. No penile lesions or rashes. Scrotum without lesions, cysts, rashes and/or edema.   Psychiatric: Normal mood and affect.   Procedure *** Patient's left corpus cavernosum is identified.  An area near the base of the penis is cleansed with rubbing alcohol.  Careful to avoid the dorsal vein, *** mcg of Trimix (papaverine 30 mg, phentolamine 1 mg and prostaglandin E1 10 mcg, Lot # ***@*** exp # *** is injected at a 90 degree angle into the left *** corpus cavernosum near the base of the penis.  Patient experienced a very firm erection in 15 minutes.     Assessment & Plan:    1. Peyronie's disease - erection induced - plaque marked - curvature measured - see photos above  - return this afternoon for first Xiaflex  injection of this cycle   CLOTILDA CORNWALL, PA-C   [facility] [address] [place], [place] [address] [phone]

## 2023-11-09 ENCOUNTER — Ambulatory Visit: Admitting: Urology

## 2023-11-09 ENCOUNTER — Encounter: Payer: Self-pay | Admitting: Urology

## 2023-11-09 VITALS — BP 127/86 | HR 85 | Ht 67.0 in | Wt 130.0 lb

## 2023-11-09 VITALS — Ht 72.0 in | Wt 130.0 lb

## 2023-11-09 DIAGNOSIS — N486 Induration penis plastica: Secondary | ICD-10-CM

## 2023-11-09 MED ORDER — COLLAGENASE CLOSTRID HISTOLYT 0.9 MG IJ SOLR
0.9000 mg | Freq: Once | INTRAMUSCULAR | Status: AC
  Administered 2023-11-09: 0.9 mg via INTRAMUSCULAR

## 2023-11-09 NOTE — Progress Notes (Signed)
   Procedure: Xiaflex  injection (cycle #2/injection #1)   Previously marked penile plaque site remained visible with marking pen.  The plaque was easily palpable on the left mid shaft . Xiaflex  injection (0.58 mg) was then injected directly into the plaque at the point of maximal curvature at an angle after being warmed to room temperature using a small TB needle.  He tolerated the procedure very well.    - Patient tolerated the procedure well - No intercourse x 4 weeks - Follow-up 11/5 for injection #2   Glendia Barba, MD

## 2023-11-10 ENCOUNTER — Encounter: Payer: Self-pay | Admitting: Urology

## 2023-11-10 ENCOUNTER — Ambulatory Visit: Admitting: Urology

## 2023-11-10 VITALS — BP 146/91 | HR 87 | Ht 67.0 in | Wt 130.0 lb

## 2023-11-10 DIAGNOSIS — N486 Induration penis plastica: Secondary | ICD-10-CM | POA: Diagnosis not present

## 2023-11-10 MED ORDER — COLLAGENASE CLOSTRID HISTOLYT 0.9 MG IJ SOLR
0.9000 mg | Freq: Once | INTRAMUSCULAR | Status: AC
  Administered 2023-11-10: 0.9 mg via INTRAMUSCULAR

## 2023-11-10 NOTE — Progress Notes (Signed)
    Procedure: Xiaflex  injection cycle #2/injection #2   Previously marked penile plaque site  Remained visible with marking pen. The plaque was easily palpable on the mid left shaft . Xiaflex  injection (0.58 mg) was then injected directly into the plaque at the point of maximal curvature at an angle after being warm to room temperature using a small TB needle.  He tolerated the procedure very well.    - Patient tolerated the procedure well - No intercourse x 4 weeks - Scheduled in office modeling tomorrow       Glendia Barba, MD

## 2023-11-11 ENCOUNTER — Ambulatory Visit (INDEPENDENT_AMBULATORY_CARE_PROVIDER_SITE_OTHER): Admitting: Urology

## 2023-11-11 ENCOUNTER — Encounter: Payer: Self-pay | Admitting: Urology

## 2023-11-11 VITALS — BP 146/83 | HR 99 | Ht 67.0 in | Wt 130.0 lb

## 2023-11-11 DIAGNOSIS — N486 Induration penis plastica: Secondary | ICD-10-CM

## 2023-11-11 NOTE — Progress Notes (Signed)
 Xiaflex  modeling  Patient's Peyronie's plaque is identified and palpated on the phallus. Wearing gloves, I grasped the plaque about 1 cm proximal and distal to the injection site, making sure to avoid direct pressure on the injection site.  Using the target plaque as a fulcrum point, I used both hands to apply firm, steady pressure to elongate and stretch the plaque.  The penis was bent opposite to the patient's penile curvature, with stretching to the point of moderate resistance. I held pressure for 30 seconds then released. After a 60 second rest period, I repeated the penile modeling technique for a total of 3 modeling attempts at 30 seconds for each attempt.  We discussed penile strengthening and bending exercises at home for the next 6 weeks; instruction sheet provided to the patient. He tolerated well with no concerns.   He had a blister form on the coronal ridge this am. He noticed it in the shower.    I advised I advised him I advised him to ice it if it becomes uncomfortable and it may open and bleed, but it should stop bleeding with pressure applying to the penis.  If it should rupture and continue to bleed, he will notify us .  If he notices it continues to enlarge, I advised him to contact us .

## 2023-11-12 ENCOUNTER — Encounter: Payer: Self-pay | Admitting: Urology

## 2023-11-12 ENCOUNTER — Telehealth: Payer: Self-pay

## 2023-11-12 NOTE — Telephone Encounter (Signed)
 Pt lm on triage line -   He states he called yesterday and no one returned his call. He had a procedure done and now he looks like he has an infection.   Per LM my chart message. She s/w  SM and SCS area does not look infected.  See Message. He needs to keep the area clean and dry. He can use ice. He did send a message that the blister had burst. Again advised pt to keep area clean and dry. Ice for swelling if needed. Monitor and if sx get worse pt will contact office.   Pt voiced understanding.

## 2023-11-16 NOTE — Progress Notes (Unsigned)
 11/17/2023 8:59 PM   Arthur [name] 27-Mar-1965 [id number]  Referring provider: Vicci Duwaine SQUIBB, DO [address] [place],  [place] [address]  Urological history: 1. ED - Sildenafil  100 mg, on-demand dosing   2. Peyronie's disease - completed 2nd Xiaflex  cycle 11/2023    3. BPH with LU TS - PSA (09/2023) 1.4 - tamsulosin  0.4 mg daily   No chief complaint on file.  HPI: Arthur [name] is a 58 y.o. man who presents today for recheck on blood blister on penis.    Previous records reviewed.  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  Refer to photos at previous visit.          PMH: Past Medical History:  Diagnosis Date   Arthritis    Diabetes mellitus with renal complications (HCC)    Erectile dysfunction associated with type 2 diabetes mellitus (HCC)    Exotropia    History of tobacco abuse    Hyperlipidemia    Hypertension    Long term current use of aspirin     Thoracic back pain     Surgical History: Past Surgical History:  Procedure Laterality Date   COLONOSCOPY WITH PROPOFOL  N/A 01/25/2019   Procedure: COLONOSCOPY WITH PROPOFOL ;  Surgeon: Unk Corinn Skiff, MD;  Location: [facility] ENDOSCOPY;  Service: Gastroenterology;  Laterality: N/A;  Positive on 12/22/2018   CYSTOSCOPY W/ RETROGRADES Left 10/27/2022   Procedure: CYSTOSCOPY WITH RETROGRADE PYELOGRAM;  Surgeon: Twylla Glendia BROCKS, MD;  Location: [facility] ORS;  Service: Urology;  Laterality: Left;   ESOPHAGOGASTRODUODENOSCOPY (EGD) WITH PROPOFOL  N/A 07/14/2022   Procedure: ESOPHAGOGASTRODUODENOSCOPY (EGD) WITH PROPOFOL ;  Surgeon: Therisa Bi, MD;  Location: [facility] ENDOSCOPY;  Service: Gastroenterology;  Laterality: N/A;   HIP SURGERY Right    SHOULDER SURGERY Left    bone spurs removed   URETEROSCOPY Left 10/27/2022   Procedure: DIAGNOSTIC URETEROSCOPY;  Surgeon: Twylla Glendia BROCKS, MD;  Location: [facility] ORS;  Service: Urology;  Laterality: Left;    Home Medications:  Allergies  as of 11/17/2023       Reactions   Codeine Nausea Only   Hydrocodone Nausea Only   Oxycodone  Nausea Only        Medication List        Accurate as of November 16, 2023  8:59 PM. If you have any questions, ask your nurse or doctor.          aluminum-magnesium hydroxide 200-200 MG/5ML suspension Take 15 mLs by mouth every 6 (six) hours as needed for indigestion.   Aspirin  Low Dose 81 MG tablet Generic drug: aspirin  EC TAKE 1 TABLET(81 MG) BY MOUTH DAILY   atorvastatin  20 MG tablet Commonly known as: LIPITOR  Take 1 tablet (20 mg total) by mouth daily.   B-D TB SYRINGE 1CC/27GX1/2 27G X 1/2 1 ML Misc Generic drug: TUBERCULIN SYR 1CC/27GX1/2 USE FOR XIAFLEX  INJECTION   FreeStyle Libre 3 Plus Sensor Misc Change sensor every 15 days.   ipratropium 0.06 % nasal spray Commonly known as: ATROVENT  Place 2 sprays into both nostrils 4 (four) times daily.   losartan  25 MG tablet Commonly known as: COZAAR  TAKE 1/2 TABLET(12.5 MG) BY MOUTH DAILY   metFORMIN  500 MG 24 hr tablet Commonly known as: GLUCOPHAGE -XR Take 2 tablets (1,000 mg total) by mouth 2 (two) times daily with a meal.   Mounjaro  12.5 MG/0.5ML Pen Generic drug: tirzepatide  Inject 12.5 mg into the skin once a week.   tamsulosin  0.4 MG Caps  capsule Commonly known as: FLOMAX  Take 1 capsule (0.4 mg total) by mouth daily.   Xiaflex  0.9 MG Solr Generic drug: Collagenase  Clostrid Histolyt AFTER RECONSTITUTION WITH SUPPLIED DILUENT , INJECT 0.58MG  INTO INTRALESIONAL PLAQUE 1X ON 2 DAYS, 1-3 DAYS APART EVERY 6 WEEKS. MAY REPEAT UP TO 4 CYCLES. DISCARD REMAINDER        Allergies:  Allergies  Allergen Reactions   Codeine Nausea Only   Hydrocodone Nausea Only   Oxycodone  Nausea Only    Family History: Family History  Problem Relation Age of Onset   Diabetes Father    Lung disease Mother    Diabetes Sister    Diabetes Maternal Aunt     Social History:  reports that he quit smoking about 37 years  ago. His smoking use included cigarettes. He started smoking about 57 years ago. He has a 20 pack-year smoking history. He has never used smokeless tobacco. He reports that he does not drink alcohol and does not use drugs.  ROS: Pertinent ROS in HPI  Physical Exam: There were no vitals taken for this visit.  Constitutional:  Well nourished. Alert and oriented, No acute distress. HEENT: [place] AT, moist mucus membranes.  Trachea midline, no masses. Cardiovascular: No clubbing, cyanosis, or edema. Respiratory: Normal respiratory effort, no increased work of breathing. GI: Abdomen is soft, non tender, non distended, no abdominal masses. Liver and spleen not palpable.  No hernias appreciated.  Stool sample for occult testing is not indicated.   GU: No CVA tenderness.  No bladder fullness or masses.  Patient with circumcised/uncircumcised phallus. ***Foreskin easily retracted***  Urethral meatus is patent.  No penile discharge. No penile lesions or rashes. Scrotum without lesions, cysts, rashes and/or edema.  Testicles are located scrotally bilaterally. No masses are appreciated in the testicles. Left and right epididymis are normal. Rectal: Patient with  normal sphincter tone. Anus and perineum without scarring or rashes. No rectal masses are appreciated. Prostate is approximately *** grams, *** nodules are appreciated. Seminal vesicles are normal. Skin: No rashes, bruises or suspicious lesions. Lymph: No cervical or inguinal adenopathy. Neurologic: Grossly intact, no focal deficits, moving all 4 extremities. Psychiatric: Normal mood and affect.  Laboratory Data: See Epic and HPI   I have reviewed the labs.   Pertinent Imaging: ***  Assessment & Plan:  ***  1. Hemorrhagic bulla   No follow-ups on file.  These notes generated with voice recognition software. I apologize for typographical errors.  Arthur [name]  [facility] [address]  [address] [place], [place] [address] [phone]

## 2023-11-16 NOTE — Telephone Encounter (Signed)
 Made appt for patient to come in to bee seen by provider on 11/12 with Clotilda

## 2023-11-17 ENCOUNTER — Ambulatory Visit (INDEPENDENT_AMBULATORY_CARE_PROVIDER_SITE_OTHER): Admitting: Urology

## 2023-11-17 ENCOUNTER — Encounter: Payer: Self-pay | Admitting: Urology

## 2023-11-17 VITALS — BP 152/93 | HR 82 | Ht 67.0 in | Wt 130.0 lb

## 2023-11-17 DIAGNOSIS — R238 Other skin changes: Secondary | ICD-10-CM

## 2023-11-17 MED ORDER — MUPIROCIN 2 % EX OINT: 1.0000 | TOPICAL_OINTMENT | Freq: Two times a day (BID) | CUTANEOUS | 0 refills | Status: AC

## 2023-11-23 ENCOUNTER — Other Ambulatory Visit (HOSPITAL_COMMUNITY): Payer: Self-pay

## 2023-11-23 NOTE — Progress Notes (Deleted)
 11/25/2023 1:17 PM   Arthur [name] 12-25-1965 [id number]  Referring provider: Vicci Duwaine SQUIBB, DO [address] [place],  [place] [address]  Urological history: 1. ED - Sildenafil  100 mg, on-demand dosing   2. Peyronie's disease - completed 2nd Xiaflex  cycle 11/2023    3. BPH with LU TS - PSA (09/2023) 1.4 - tamsulosin  0.4 mg daily   No chief complaint on file.  HPI: Arthur [name] is a 58 y.o. man who presents today for recheck on wound on penis.    Previous records reviewed.  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  Refer to photos at previous visit.  The next [name] the blister broke open and he bled.  Now the area is tender but is difficult for him to wear clothing.  He has been putting antibiotic ointment and covering it with gauze.  Patient denies any modifying or aggravating factors.  Patient denies any recent UTI's, gross hematuria, dysuria or suprapubic/flank pain.  Patient denies any fevers, chills, nausea or vomiting.    PMH: Past Medical History:  Diagnosis Date   Arthritis    Diabetes mellitus with renal complications (HCC)    Erectile dysfunction associated with type 2 diabetes mellitus (HCC)    Exotropia    History of tobacco abuse    Hyperlipidemia    Hypertension    Long term current use of aspirin     Thoracic back pain     Surgical History: Past Surgical History:  Procedure Laterality Date   COLONOSCOPY WITH PROPOFOL  N/A 01/25/2019   Procedure: COLONOSCOPY WITH PROPOFOL ;  Surgeon: Unk Corinn Skiff, MD;  Location: [facility] ENDOSCOPY;  Service: Gastroenterology;  Laterality: N/A;  Positive on 12/22/2018   CYSTOSCOPY W/ RETROGRADES Left 10/27/2022   Procedure: CYSTOSCOPY WITH RETROGRADE PYELOGRAM;  Surgeon: Twylla Glendia BROCKS, MD;  Location: [facility] ORS;  Service: Urology;  Laterality: Left;   ESOPHAGOGASTRODUODENOSCOPY (EGD) WITH PROPOFOL  N/A 07/14/2022   Procedure: ESOPHAGOGASTRODUODENOSCOPY (EGD) WITH PROPOFOL ;   Surgeon: Therisa Bi, MD;  Location: [facility] ENDOSCOPY;  Service: Gastroenterology;  Laterality: N/A;   HIP SURGERY Right    SHOULDER SURGERY Left    bone spurs removed   URETEROSCOPY Left 10/27/2022   Procedure: DIAGNOSTIC URETEROSCOPY;  Surgeon: Twylla Glendia BROCKS, MD;  Location: [facility] ORS;  Service: Urology;  Laterality: Left;    Home Medications:  Allergies as of 11/25/2023       Reactions   Codeine Nausea Only   Hydrocodone Nausea Only   Oxycodone  Nausea Only        Medication List        Accurate as of November 23, 2023  1:17 PM. If you have any questions, ask your nurse or doctor.          aluminum-magnesium hydroxide 200-200 MG/5ML suspension Take 15 mLs by mouth every 6 (six) hours as needed for indigestion.   Aspirin  Low Dose 81 MG tablet Generic drug: aspirin  EC TAKE 1 TABLET(81 MG) BY MOUTH DAILY   atorvastatin  20 MG tablet Commonly known as: LIPITOR  Take 1 tablet (20 mg total) by mouth daily.   B-D TB SYRINGE 1CC/27GX1/2 27G X 1/2 1 ML Misc Generic drug: TUBERCULIN SYR 1CC/27GX1/2 USE FOR XIAFLEX  INJECTION   FreeStyle Libre 3 Plus Sensor Misc Change sensor every 15 days.   ipratropium 0.06 % nasal spray Commonly known as: ATROVENT  Place 2 sprays into both nostrils 4 (four) times daily.   losartan  25 MG tablet Commonly known as:  COZAAR  TAKE 1/2 TABLET(12.5 MG) BY MOUTH DAILY   metFORMIN  500 MG 24 hr tablet Commonly known as: GLUCOPHAGE -XR Take 2 tablets (1,000 mg total) by mouth 2 (two) times daily with a meal.   Mounjaro  12.5 MG/0.5ML Pen Generic drug: tirzepatide  Inject 12.5 mg into the skin once a week.   mupirocin ointment 2 % Commonly known as: BACTROBAN Apply 1 Application topically 2 (two) times daily.   tamsulosin  0.4 MG Caps capsule Commonly known as: FLOMAX  Take 1 capsule (0.4 mg total) by mouth daily.   Xiaflex  0.9 MG Solr Generic drug: Collagenase  Clostrid Histolyt AFTER RECONSTITUTION WITH SUPPLIED DILUENT , INJECT 0.58MG   INTO INTRALESIONAL PLAQUE 1X ON 2 DAYS, 1-3 DAYS APART EVERY 6 WEEKS. MAY REPEAT UP TO 4 CYCLES. DISCARD REMAINDER        Allergies:  Allergies  Allergen Reactions   Codeine Nausea Only   Hydrocodone Nausea Only   Oxycodone  Nausea Only    Family History: Family History  Problem Relation Age of Onset   Diabetes Father    Lung disease Mother    Diabetes Sister    Diabetes Maternal Aunt     Social History:  reports that he quit smoking about 37 years ago. His smoking use included cigarettes. He started smoking about 57 years ago. He has a 20 pack-year smoking history. He has never used smokeless tobacco. He reports that he does not drink alcohol and does not use drugs.  ROS: Pertinent ROS in HPI  Physical Exam: There were no vitals taken for this visit.  Constitutional:  Well nourished. Alert and oriented, No acute distress. HEENT: [place] AT, moist mucus membranes.  Trachea midline, no masses. Cardiovascular: No clubbing, cyanosis, or edema. Respiratory: Normal respiratory effort, no increased work of breathing. GI: Abdomen is soft, non tender, non distended, no abdominal masses. Liver and spleen not palpable.  No hernias appreciated.  Stool sample for occult testing is not indicated.   GU: No CVA tenderness.  No bladder fullness or masses.  Patient with circumcised/uncircumcised phallus. ***Foreskin easily retracted***  Urethral meatus is patent.  No penile discharge. No penile lesions or rashes. Scrotum without lesions, cysts, rashes and/or edema.  Testicles are located scrotally bilaterally. No masses are appreciated in the testicles. Left and right epididymis are normal. Rectal: Patient with  normal sphincter tone. Anus and perineum without scarring or rashes. No rectal masses are appreciated. Prostate is approximately *** grams, *** nodules are appreciated. Seminal vesicles are normal. Skin: No rashes, bruises or suspicious lesions. Lymph: No cervical or inguinal  adenopathy. Neurologic: Grossly intact, no focal deficits, moving all 4 extremities. Psychiatric: Normal mood and affect.   Laboratory Data: See Epic and HPI   I have reviewed the labs.   Pertinent Imaging:   Assessment & Plan:    1. Hemorrhagic bulla - I gave him some petroleum infused gauze to cover the area - I sent in a prescription for Bactroban cream to put on twice daily - Discussed ED and return to clinic triggers   No follow-ups on file.  These notes generated with voice recognition software. I apologize for typographical errors.  CLOTILDA HELON RIGGERS  [facility] [address]  [address] [place], [place] [address] [phone]

## 2023-11-25 ENCOUNTER — Ambulatory Visit: Admitting: Urology

## 2023-11-25 DIAGNOSIS — R238 Other skin changes: Secondary | ICD-10-CM

## 2023-11-29 ENCOUNTER — Telehealth: Payer: Self-pay

## 2023-11-29 NOTE — Telephone Encounter (Signed)
 Called patient on 11/21 to follow up with him on his wound check. Pt no-showed appt and we were calling to see how he was doing and if he needed to reschedule. We left voicemail to call us  back if he needed anything

## 2023-12-08 NOTE — Progress Notes (Unsigned)
 12/09/23 Name: Arthur [name] MRN: [id number] DOB: February 18, 1965  Arthur [name] is a 58 y.o. year old male who presented for a telephone follow-up visit   They were referred to the pharmacist by their PCP for assistance in managing diabetes.   Subjective:  Care Team: Primary Care Provider: Vicci Duwaine SQUIBB, DO ; Next Scheduled Visit: 01/13/2024  Diabetes: Current medications: Mounjaro  12.5mg  weekly, Metformin  XR 1000mg  BID -Patient using Libre for CGM but is not currently connected to the app or linked to [employer].  He received new Libre 3+ sensors (was using Libre 3, but these are no longer made) and states when he placed new sensor he began getting readings that did not seem accurate.  States he had several readings in the 50's with no s/sx of hypoglycemia.  BG after eating would reflect 150, which is low for him.  Did not confirm readings with fingersticks. -Patient has been on Mounjaro  12.5mg  quite sometime and endorses tolerating well but does state he experiences some s/sx of heartburn and gas, which were present before starting this medication.  He has been seen by GI and prescribed omeprazole  but stopped taking this and was using apple vinegar instead, but thinks he may need a medication on board at this time.  States heartburn does seem to be worse in the evening/night. -Recently switched to metformin  XR from IR and states he has noticed improvement in GI tolerability with this formulation -Was having issues being able to consistently refill Mounjaro , but this has not been a problem since he started filling this medication at [facility] outpatient pharmacy  -Statin on board for ASCVD risk reduction:  atorvastatin  20mg  daily -ACEi/ARB for cardiorenal protection:  losartan  12.5mg  daily; BP 129/87 at 9/25 PCP visit, but this was elevated at urology visit recently (152/93)- patient states he was rushing and stressed that day and that home BP is usually 120/70.  He does state he has  trouble splitting losartan  tablet and will sometimes take a whole tablet. -UACR 30-300 on 9/25  Objective: Lab Results  Component Value Date   HGBA1C 8.1 (H) 09/30/2023   Lab Results  Component Value Date   CREATININE 1.29 (H) 09/30/2023   BUN 9 09/30/2023   NA 137 09/30/2023   K 4.0 09/30/2023   CL 102 09/30/2023   CO2 21 09/30/2023   Assessment/Plan:   Diabetes: -A1c, UACR, and LDL not at goal for patient with diabetes -BP is moderately controlled -Continue current regimen at this time -I recommend taking famotidine  40mg  daily 30 minutes prior to supper to see if this helps with heartburn/gas- order pending for PCP to sign if in agreement.  If this does not help, patient may need to resume omeprazole . -Sees PCP again 1/8 and will be due for A1c, UACR, lipid panel, and CMP -Consider increasing Mounjaro  to 15mg  weekly if additional BG lowering is needed; it also appears patient was previously on Jardiance , but I do not see notes reflecting why this medication was stopped.  Could be beneficial based on most recent A1c and UACR. -I recommend increasing atorvastatin  to 40mg  daily if LDL still >70 with medication back on board consistently -Advised patient to re download Libre 3 app on his phone to monitor BG.  Sensor is due to be replaced in 3-4 days.  Monitor BG with fingerstick if readings do not seem accurate.  Link data to [employer] (he did receive email invite)   Follow-up:  2 months  Channing  DELENA Mealing, PharmD, DPLA

## 2023-12-09 ENCOUNTER — Other Ambulatory Visit: Payer: Self-pay

## 2023-12-09 ENCOUNTER — Other Ambulatory Visit: Payer: Self-pay | Admitting: Family Medicine

## 2023-12-09 DIAGNOSIS — E1121 Type 2 diabetes mellitus with diabetic nephropathy: Secondary | ICD-10-CM

## 2023-12-09 DIAGNOSIS — K219 Gastro-esophageal reflux disease without esophagitis: Secondary | ICD-10-CM

## 2023-12-09 DIAGNOSIS — E785 Hyperlipidemia, unspecified: Secondary | ICD-10-CM

## 2023-12-09 MED ORDER — FAMOTIDINE 40 MG PO TABS: 40.0000 mg | ORAL_TABLET | Freq: Every day | ORAL | 0 refills | Status: AC

## 2023-12-09 MED ORDER — FAMOTIDINE 40 MG PO TABS: 40.0000 mg | ORAL_TABLET | Freq: Every day | ORAL | 0 refills | Status: DC

## 2023-12-13 ENCOUNTER — Encounter: Payer: Self-pay | Admitting: Urology

## 2023-12-14 ENCOUNTER — Telehealth: Payer: Self-pay

## 2023-12-14 ENCOUNTER — Ambulatory Visit: Admitting: Urology

## 2023-12-14 NOTE — Telephone Encounter (Signed)
 Copied from CRM #[id number]. Topic: General - Call Back - No Documentation >> Dec 14, 2023 10:39 AM Tonda B wrote: Reason for CRM: [facility] is calling need code information for rx xultophy  3.6 please call [facility] [phone] ask for val voicemail is secured so info can be left

## 2023-12-15 NOTE — Telephone Encounter (Signed)
 Called and provided diagnosis codes to [facility] at [place].

## 2023-12-16 ENCOUNTER — Other Ambulatory Visit (HOSPITAL_COMMUNITY): Payer: Self-pay

## 2023-12-16 ENCOUNTER — Telehealth: Payer: Self-pay | Admitting: Pharmacy Technician

## 2023-12-16 NOTE — Telephone Encounter (Signed)
 Pharmacy Patient Advocate Encounter   Received notification from Onbase that prior authorization for Mounjaro  12.5MG /0.5ML auto-injectors is required/requested.   Insurance verification completed.   The patient is insured through [facility].   Per test claim: Refill too soon. PA is not needed at this time. Medication was filled 12/15/2023. Next eligible fill date is 01/05/2024.

## 2024-01-04 ENCOUNTER — Encounter: Payer: Self-pay | Admitting: Urology

## 2024-01-04 ENCOUNTER — Ambulatory Visit (INDEPENDENT_AMBULATORY_CARE_PROVIDER_SITE_OTHER): Admitting: Urology

## 2024-01-04 VITALS — BP 145/90 | HR 72 | Ht 67.0 in

## 2024-01-04 DIAGNOSIS — N486 Induration penis plastica: Secondary | ICD-10-CM | POA: Diagnosis not present

## 2024-01-04 MED ORDER — TADALAFIL 5 MG PO TABS: 5.0000 mg | ORAL_TABLET | Freq: Every day | ORAL | 3 refills | Status: AC

## 2024-01-04 NOTE — Progress Notes (Signed)
 "  01/04/2024 5:06 PM   Arthur [name] June 13, 1965 [id number]  Referring provider: Vicci Duwaine SQUIBB, DO [address] [place],  [place] [address]  Chief Complaint  Patient presents with   Follow-up   Urologic history: 1.  Peyronie's disease Initial visit 04/09/2022; 2-3-year history mid shaft leftward curvature estimated 45 degrees Xiaflex  cycle 1 completed 06/17/2023; curvature at that time was close to 90 degrees.  Curvature improved to >45 degrees after first cycle Xiaflex  cycle 2 completed 11/10/2023  2.  Abnormal abdominal MRI Possible filling defect left upper pole calyx MRI liver 08/31/2022 Cystoscopy with left retrograde pyelogram 10/27/2022 showed no abnormalities   HPI: Arthur [name] is a 58 y.o. male presents for follow-up Peyronie's disease  After cycle 2 he noted significant improvement in his penile curvature and presently estimates the curvature at ~30 degrees He has had successful intercourse but does note some slight penile shortening   PMH: Past Medical History:  Diagnosis Date   Arthritis    Diabetes mellitus with renal complications (HCC)    Erectile dysfunction associated with type 2 diabetes mellitus (HCC)    Exotropia    History of tobacco abuse    Hyperlipidemia    Hypertension    Long term current use of aspirin     Thoracic back pain     Surgical History: Past Surgical History:  Procedure Laterality Date   COLONOSCOPY WITH PROPOFOL  N/A 01/25/2019   Procedure: COLONOSCOPY WITH PROPOFOL ;  Surgeon: Unk Corinn Skiff, MD;  Location: [facility] ENDOSCOPY;  Service: Gastroenterology;  Laterality: N/A;  Positive on 12/22/2018   CYSTOSCOPY W/ RETROGRADES Left 10/27/2022   Procedure: CYSTOSCOPY WITH RETROGRADE PYELOGRAM;  Surgeon: Twylla Glendia BROCKS, MD;  Location: [facility] ORS;  Service: Urology;  Laterality: Left;   ESOPHAGOGASTRODUODENOSCOPY (EGD) WITH PROPOFOL  N/A 07/14/2022   Procedure: ESOPHAGOGASTRODUODENOSCOPY (EGD) WITH PROPOFOL ;  Surgeon: Therisa Bi, MD;   Location: [facility] ENDOSCOPY;  Service: Gastroenterology;  Laterality: N/A;   HIP SURGERY Right    SHOULDER SURGERY Left    bone spurs removed   URETEROSCOPY Left 10/27/2022   Procedure: DIAGNOSTIC URETEROSCOPY;  Surgeon: Twylla Glendia BROCKS, MD;  Location: [facility] ORS;  Service: Urology;  Laterality: Left;    Home Medications:  Allergies as of 01/04/2024       Reactions   Codeine Nausea Only   Hydrocodone Nausea Only   Oxycodone  Nausea Only        Medication List        Accurate as of January 04, 2024  5:06 PM. If you have any questions, ask your nurse or doctor.          STOP taking these medications    Xiaflex  0.9 MG Solr Generic drug: Collagenase  Clostrid Histolyt       TAKE these medications    aluminum-magnesium hydroxide 200-200 MG/5ML suspension Take 15 mLs by mouth every 6 (six) hours as needed for indigestion.   Aspirin  Low Dose 81 MG tablet Generic drug: aspirin  EC TAKE 1 TABLET(81 MG) BY MOUTH DAILY   atorvastatin  20 MG tablet Commonly known as: LIPITOR  Take 1 tablet (20 mg total) by mouth daily.   B-D TB SYRINGE 1CC/27GX1/2 27G X 1/2 1 ML Misc Generic drug: TUBERCULIN SYR 1CC/27GX1/2 USE FOR XIAFLEX  INJECTION   famotidine  40 MG tablet Commonly known as: PEPCID  Take 1 tablet (40 mg total) by mouth daily. 30 minutes before supper   FreeStyle Libre 3 Plus Sensor Misc Change sensor every 15 days.   ipratropium 0.06 % nasal spray Commonly  known as: ATROVENT  Place 2 sprays into both nostrils 4 (four) times daily.   losartan  25 MG tablet Commonly known as: COZAAR  TAKE 1/2 TABLET(12.5 MG) BY MOUTH DAILY   metFORMIN  500 MG 24 hr tablet Commonly known as: GLUCOPHAGE -XR Take 2 tablets (1,000 mg total) by mouth 2 (two) times daily with a meal.   Mounjaro  12.5 MG/0.5ML Pen Generic drug: tirzepatide  Inject 12.5 mg into the skin once a week.   mupirocin  ointment 2 % Commonly known as: BACTROBAN  Apply 1 Application topically 2 (two) times daily.    tamsulosin  0.4 MG Caps capsule Commonly known as: FLOMAX  Take 1 capsule (0.4 mg total) by mouth daily.        Allergies: Allergies[1]  Family History: Family History  Problem Relation Age of Onset   Diabetes Father    Lung disease Mother    Diabetes Sister    Diabetes Maternal Aunt     Social History:  reports that he quit smoking about 38 years ago. His smoking use included cigarettes. He started smoking about 58 years ago. He has a 20 pack-year smoking history. He has never used smokeless tobacco. He reports that he does not drink alcohol and does not use drugs.   Physical Exam: BP (!) 145/90 (BP Location: Left Arm, Patient Position: Sitting, Cuff Size: Normal)   Pulse 72   Ht 5' 7 (1.702 m)   SpO2 99%   BMI 20.36 kg/m   Constitutional:  Alert, No acute distress. HEENT: [place] AT Respiratory: Normal respiratory effort, no increased work of breathing. Psychiatric: Normal mood and affect.   Assessment & Plan:    1.  Peyronie's disease Curvature ~30 degrees after second Xiaflex  cycle Having successful intercourse and does not desire cycle #3 at this time.  He indicated he would call back as needed Rx tadalafil 5 mg daily to improve intracorporal oxygen blood flow    Giulliana Mcroberts C Amun Stemm, MD  [facility] [place] [address] [place], [place] [address] [phone]    [1]  Allergies Allergen Reactions   Codeine Nausea Only   Hydrocodone Nausea Only   Oxycodone  Nausea Only   "

## 2024-01-13 ENCOUNTER — Other Ambulatory Visit: Payer: Self-pay

## 2024-01-13 ENCOUNTER — Ambulatory Visit (INDEPENDENT_AMBULATORY_CARE_PROVIDER_SITE_OTHER): Admitting: Family Medicine

## 2024-01-13 ENCOUNTER — Encounter: Payer: Self-pay | Admitting: Family Medicine

## 2024-01-13 VITALS — BP 136/87 | HR 96 | Temp 97.4°F | Resp 16 | Ht 67.01 in | Wt 135.0 lb

## 2024-01-13 DIAGNOSIS — Z794 Long term (current) use of insulin: Secondary | ICD-10-CM

## 2024-01-13 DIAGNOSIS — E1121 Type 2 diabetes mellitus with diabetic nephropathy: Secondary | ICD-10-CM

## 2024-01-13 DIAGNOSIS — B351 Tinea unguium: Secondary | ICD-10-CM | POA: Diagnosis not present

## 2024-01-13 LAB — BAYER DCA HB A1C WAIVED: HB A1C (BAYER DCA - WAIVED): 7.3 % — ABNORMAL HIGH (ref 4.8–5.6)

## 2024-01-13 MED ORDER — CICLOPIROX 8 % EX SOLN
Freq: Every day | CUTANEOUS | 0 refills | Status: AC
  Filled 2024-01-13 – 2024-01-31 (×2): qty 6.6, 30d supply, fill #0

## 2024-01-13 MED ORDER — MOUNJARO 12.5 MG/0.5ML ~~LOC~~ SOAJ
12.5000 mg | SUBCUTANEOUS | 1 refills | Status: AC
  Filled 2024-01-13: qty 2, 28d supply, fill #0

## 2024-01-13 NOTE — Assessment & Plan Note (Signed)
 Doing great with A1c of 7.3 down from 8.1. Continue current regimen. Continue to monitor. Call with any concerns.

## 2024-01-13 NOTE — Progress Notes (Signed)
 "  BP 136/87 (BP Location: Left Arm, Patient Position: Sitting, Cuff Size: Normal)   Pulse 96   Temp (!) 97.4 F (36.3 C) (Oral)   Resp 16   Ht 5' 7.01 (1.702 m)   Wt 135 lb (61.2 kg)   SpO2 96%   BMI 21.14 kg/m    Subjective:    Patient ID: Arthur [name], male    DOB: 04-Oct-1965, 59 y.o.   MRN: [id number]  HPI: Arthur [name] is a 59 y.o. male  Chief Complaint  Patient presents with   Diabetes   DIABETES Hypoglycemic episodes:no Polydipsia/polyuria: no Visual disturbance: no Chest pain: no Paresthesias: no Glucose Monitoring: no  Accucheck frequency: having issues with his CGM- has not been checking in about 2 weeks Taking Insulin ?: no Blood Pressure Monitoring: not checking Retinal Examination: Up to Date Foot Exam: Up to Date Diabetic Education: Completed Pneumovax: Up to Date Influenza: Up to Date Aspirin : no  Relevant past medical, surgical, family and social history reviewed and updated as indicated. Interim medical history since our last visit reviewed. Allergies and medications reviewed and updated.  Review of Systems  Constitutional: Negative.   Respiratory: Negative.    Cardiovascular: Negative.   Musculoskeletal: Negative.   Neurological: Negative.   Psychiatric/Behavioral: Negative.      Per HPI unless specifically indicated above     Objective:    BP 136/87 (BP Location: Left Arm, Patient Position: Sitting, Cuff Size: Normal)   Pulse 96   Temp (!) 97.4 F (36.3 C) (Oral)   Resp 16   Ht 5' 7.01 (1.702 m)   Wt 135 lb (61.2 kg)   SpO2 96%   BMI 21.14 kg/m   Wt Readings from Last 3 Encounters:  01/13/24 135 lb (61.2 kg)  11/17/23 130 lb (59 kg)  11/11/23 130 lb (59 kg)    Physical Exam Vitals and nursing note reviewed.  Constitutional:      General: He is not in acute distress.    Appearance: Normal appearance. He is not ill-appearing, toxic-appearing or diaphoretic.  HENT:     Head: Normocephalic and atraumatic.     Right  Ear: External ear normal.     Left Ear: External ear normal.     Nose: Nose normal.     Mouth/Throat:     Mouth: Mucous membranes are moist.     Pharynx: Oropharynx is clear.  Eyes:     General: No scleral icterus.       Right eye: No discharge.        Left eye: No discharge.     Extraocular Movements: Extraocular movements intact.     Conjunctiva/sclera: Conjunctivae normal.     Pupils: Pupils are equal, round, and reactive to light.  Cardiovascular:     Rate and Rhythm: Normal rate and regular rhythm.     Pulses: Normal pulses.     Heart sounds: Normal heart sounds. No murmur heard.    No friction rub. No gallop.  Pulmonary:     Effort: Pulmonary effort is normal. No respiratory distress.     Breath sounds: Normal breath sounds. No stridor. No wheezing, rhonchi or rales.  Chest:     Chest wall: No tenderness.  Musculoskeletal:        General: Normal range of motion.     Cervical back: Normal range of motion and neck supple.  Skin:    General: Skin is warm and dry.     Capillary Refill: Capillary refill takes less  than 2 seconds.     Coloration: Skin is not jaundiced or pale.     Findings: No bruising, erythema, lesion or rash.     Comments: Thickened toenails  Neurological:     General: No focal deficit present.     Mental Status: He is alert and oriented to person, place, and time. Mental status is at baseline.  Psychiatric:        Mood and Affect: Mood normal.        Behavior: Behavior normal.        Thought Content: Thought content normal.        Judgment: Judgment normal.     Results for orders placed or performed in visit on 09/30/23  Microalbumin, Urine Waived   Collection Time: 09/30/23  2:38 PM  Result Value Ref Range   Microalb, Ur Waived 80 (H) 0 - 19 mg/L   Creatinine, Urine Waived 50 10 - 300 mg/dL   Microalb/Creat Ratio 30-300 (H) <30 mg/g  Bayer DCA Hb A1c Waived   Collection Time: 09/30/23  2:38 PM  Result Value Ref Range   HB A1C (BAYER DCA -  WAIVED) 8.1 (H) 4.8 - 5.6 %  Comprehensive metabolic panel with GFR   Collection Time: 09/30/23  2:44 PM  Result Value Ref Range   Glucose 108 (H) 70 - 99 mg/dL   BUN 9 8 - 27 mg/dL   Creatinine, Ser 8.70 (H) 0.76 - 1.27 mg/dL   eGFR 63 >40 fO/fpw/8.26   BUN/Creatinine Ratio 7 (L) 10 - 24   Sodium 137 134 - 144 mmol/L   Potassium 4.0 3.5 - 5.2 mmol/L   Chloride 102 96 - 106 mmol/L   CO2 21 20 - 29 mmol/L   Calcium  9.9 8.6 - 10.2 mg/dL   Total Protein 6.8 6.0 - 8.5 g/dL   Albumin 4.5 3.8 - 4.9 g/dL   Globulin, Total 2.3 1.5 - 4.5 g/dL   Bilirubin Total 0.4 0.0 - 1.2 mg/dL   Alkaline Phosphatase 81 47 - 123 IU/L   AST 23 0 - 40 IU/L   ALT 25 0 - 44 IU/L  CBC with Differential/Platelet   Collection Time: 09/30/23  2:44 PM  Result Value Ref Range   WBC 5.2 3.4 - 10.8 x10E3/uL   RBC 4.77 4.14 - 5.80 x10E6/uL   Hemoglobin 13.7 13.0 - 17.7 g/dL   Hematocrit 58.3 62.4 - 51.0 %   MCV 87 79 - 97 fL   MCH 28.7 26.6 - 33.0 pg   MCHC 32.9 31.5 - 35.7 g/dL   RDW 86.7 88.3 - 84.5 %   Platelets 311 150 - 450 x10E3/uL   Neutrophils 47 Not Estab. %   Lymphs 39 Not Estab. %   Monocytes 8 Not Estab. %   Eos 6 Not Estab. %   Basos 0 Not Estab. %   Neutrophils Absolute 2.4 1.4 - 7.0 x10E3/uL   Lymphocytes Absolute 2.1 0.7 - 3.1 x10E3/uL   Monocytes Absolute 0.4 0.1 - 0.9 x10E3/uL   EOS (ABSOLUTE) 0.3 0.0 - 0.4 x10E3/uL   Basophils Absolute 0.0 0.0 - 0.2 x10E3/uL   Immature Granulocytes 0 Not Estab. %   Immature Grans (Abs) 0.0 0.0 - 0.1 x10E3/uL  Lipid Panel w/o Chol/HDL Ratio   Collection Time: 09/30/23  2:44 PM  Result Value Ref Range   Cholesterol, Total 195 100 - 199 mg/dL   Triglycerides 96 0 - 149 mg/dL   HDL 52 >60 mg/dL   VLDL Cholesterol Cal 17  5 - 40 mg/dL   LDL Chol Calc (NIH) 873 (H) 0 - 99 mg/dL  PSA   Collection Time: 09/30/23  2:44 PM  Result Value Ref Range   Prostate Specific Ag, Serum 1.4 0.0 - 4.0 ng/mL  TSH   Collection Time: 09/30/23  2:44 PM  Result Value  Ref Range   TSH 2.410 0.450 - 4.500 uIU/mL      Assessment & Plan:   Problem List Items Addressed This Visit       Endocrine   Type 2 diabetes mellitus with renal manifestations (HCC) - Primary   Doing great with A1c of 7.3 down from 8.1. Continue current regimen. Continue to monitor. Call with any concerns.       Relevant Medications   tirzepatide  (MOUNJARO ) 12.5 MG/0.5ML Pen   Other Relevant Orders   Bayer DCA Hb A1c Waived   Basic metabolic panel with GFR   Pneumococcal conjugate vaccine 20-valent (Prevnar 20)     Musculoskeletal and Integument   Onychomycosis   Relevant Medications   ciclopirox  (PENLAC ) 8 % solution     Follow up plan: Return in about 3 months (around 04/12/2024).      "

## 2024-01-14 LAB — BASIC METABOLIC PANEL WITH GFR
BUN/Creatinine Ratio: 8 — ABNORMAL LOW (ref 10–24)
BUN: 10 mg/dL (ref 8–27)
CO2: 20 mmol/L (ref 20–29)
Calcium: 10 mg/dL (ref 8.6–10.2)
Chloride: 101 mmol/L (ref 96–106)
Creatinine, Ser: 1.19 mg/dL (ref 0.76–1.27)
Glucose: 191 mg/dL — ABNORMAL HIGH (ref 70–99)
Potassium: 4.4 mmol/L (ref 3.5–5.2)
Sodium: 138 mmol/L (ref 134–144)
eGFR: 70 mL/min/1.73

## 2024-01-17 ENCOUNTER — Ambulatory Visit: Payer: Self-pay | Admitting: Family Medicine

## 2024-01-18 ENCOUNTER — Other Ambulatory Visit (HOSPITAL_COMMUNITY): Payer: Self-pay

## 2024-01-18 ENCOUNTER — Telehealth: Payer: Self-pay

## 2024-01-18 NOTE — Telephone Encounter (Signed)
 Pharmacy Patient Advocate Encounter   Received notification from [facility] that prior authorization for [facility] 3 plus 15 day sensors is required/requested.   Insurance verification completed.   The patient is insured through [facility].   Per test claim: Refill too soon. PA is not needed at this time. Medication was filled 01/13/24. Next eligible fill date is 02/05/24.

## 2024-01-25 ENCOUNTER — Other Ambulatory Visit: Payer: Self-pay

## 2024-01-31 ENCOUNTER — Other Ambulatory Visit: Payer: Self-pay

## 2024-02-10 ENCOUNTER — Other Ambulatory Visit (INDEPENDENT_AMBULATORY_CARE_PROVIDER_SITE_OTHER): Payer: Self-pay

## 2024-02-10 DIAGNOSIS — I129 Hypertensive chronic kidney disease with stage 1 through stage 4 chronic kidney disease, or unspecified chronic kidney disease: Secondary | ICD-10-CM

## 2024-02-10 DIAGNOSIS — K219 Gastro-esophageal reflux disease without esophagitis: Secondary | ICD-10-CM

## 2024-02-10 DIAGNOSIS — E1121 Type 2 diabetes mellitus with diabetic nephropathy: Secondary | ICD-10-CM

## 2024-02-10 DIAGNOSIS — Z794 Long term (current) use of insulin: Secondary | ICD-10-CM

## 2024-02-10 MED ORDER — LOSARTAN POTASSIUM 25 MG PO TABS: 25.0000 mg | ORAL_TABLET | Freq: Every day | ORAL | 0 refills | Status: AC

## 2024-02-10 NOTE — Progress Notes (Signed)
" ° °  02/10/24 Name: Arthur [name] MRN: [id number] DOB: 1965/09/23  Arthur [name] is a 59 y.o. year old male who presented for a telephone follow-up visit   They were referred to the pharmacist by their PCP for assistance in managing diabetes.   Subjective:  Care Team: Primary Care Provider: Vicci Duwaine SQUIBB, DO ; Next Scheduled Visit: 04/13/24  Diabetes: Current medications: Mounjaro  12.5mg  weekly, Metformin  XR 1000mg  daily -Patient using Libre for CGM and is still getting used to the new Libre 3 app after recently changing to 3+ sensors- has not yet been able to link data to practice LibreView Dashboard -Patient has been on Mounjaro  12.5mg  quite sometime and endorses tolerating well but does state he experiences some s/sx of heartburn and gas, which were present before starting this medication.  He has been seen by GI and prescribed omeprazole  but stopped taking this and was using apple vinegar instead, but thinks he may need a medication on board at this time.  States heartburn does seem to be worse in the evening/night.  Famotidine  40mg  was sent to the pharmacy, but he has not picked this up. -Recently switched to metformin  XR from IR and states he has noticed improvement in GI tolerability with this formulation; however, he has only been taking 1 tablet of XR 500mg  BID- did not realize he could be taking 2 BID -Was having issues being able to consistently refill Mounjaro , but this has not been a problem since he started filling this medication at [facility] outpatient pharmacy  -Statin on board for ASCVD risk reduction:  atorvastatin  20mg  daily- medication was not filled between 90 day supply on 3/21 and 90 day supply on 11/15 -ACEi/ARB for cardiorenal protection:  losartan  12.5mg  daily; BP 136/87 at PCP visit 1/8, and patient states home BP averaging 138-140/80-85 -UACR 30-300 on 9/25  Objective: Lab Results  Component Value Date   HGBA1C 7.3 (H) 01/13/2024   Lab Results  Component Value  Date   CREATININE 1.19 01/13/2024   BUN 10 01/13/2024   NA 138 01/13/2024   K 4.4 01/13/2024   CL 101 01/13/2024   CO2 20 01/13/2024      Component Value Date/Time   CHOL 195 09/30/2023 1444   TRIG 96 09/30/2023 1444   HDL 52 09/30/2023 1444   LDLCALC 126 (H) 09/30/2023 1444   LABVLDL 17 09/30/2023 1444    Assessment/Plan:   Diabetes: -A1c not at goal of <7% but this has improved -BP not at goal of <130/80- increased losartan  to 25mg  daily; order pending for PCP to sign if in agreement.  Advised patient to continue to monitor and record home BP at least 3x/week. -LDL is not at goal of <70- I recommend follow-up lipid panel at next PCP visit; increase atorvastatin  to 40mg  if LDL still >70 -UACR not at goal- Jardiance  would be covered in full by insurance, but patient does not wish to add additional medication to regimen -Start taking 2 tablets of metformin  XR 500mg  BID (has only been taking 1 BID) -Continue Mounjaro  12.5mg  weekly -I recommend to start taking famotidine  40mg  daily 30 minutes prior to supper to see if this helps with heartburn/gas -Continue Libre 3+ for CGM -Will try to link data to [employer] at next visit -PCP again 4/9- I recommend A1c, CMP, CBC, UACR, and lipids at that time  Follow-up:  1 month  Channing DELENA Mealing, PharmD, DPLA    "

## 2024-03-09 ENCOUNTER — Other Ambulatory Visit

## 2024-04-13 ENCOUNTER — Ambulatory Visit: Admitting: Family Medicine
